# Patient Record
Sex: Female | Born: 1950
Health system: Southern US, Community
[De-identification: ages and names within clinical notes are randomized; demographics above are authoritative.]

## PROBLEM LIST (undated history)

## (undated) DIAGNOSIS — R630 Anorexia: Secondary | ICD-10-CM

## (undated) HISTORY — PX: COLECTOMY: SHX59

## (undated) HISTORY — DX: Anorexia: R63.0

---

## 2007-11-07 ENCOUNTER — Encounter: Payer: Self-pay | Admitting: Family Medicine

## 2007-11-08 ENCOUNTER — Encounter: Payer: Self-pay | Admitting: Family Medicine

## 2007-11-14 ENCOUNTER — Inpatient Hospital Stay (HOSPITAL_COMMUNITY): Admission: AD | Admit: 2007-11-14 | Discharge: 2007-11-22 | Payer: Self-pay | Admitting: Gastroenterology

## 2007-11-27 ENCOUNTER — Encounter: Payer: Self-pay | Admitting: Family Medicine

## 2007-12-14 ENCOUNTER — Encounter: Payer: Self-pay | Admitting: Family Medicine

## 2008-01-03 ENCOUNTER — Encounter: Payer: Self-pay | Admitting: Family Medicine

## 2008-01-09 ENCOUNTER — Encounter: Payer: Self-pay | Admitting: Family Medicine

## 2008-01-18 ENCOUNTER — Encounter: Payer: Self-pay | Admitting: Family Medicine

## 2008-03-06 ENCOUNTER — Ambulatory Visit: Payer: Self-pay | Admitting: Gastroenterology

## 2008-03-06 LAB — CONVERTED CEMR LAB
Basophils Absolute: 0 10*3/uL (ref 0.0–0.1)
Eosinophils Absolute: 0.3 10*3/uL (ref 0.0–0.7)
HCT: 32.5 % — ABNORMAL LOW (ref 36.0–46.0)
Hemoglobin: 10.8 g/dL — ABNORMAL LOW (ref 12.0–15.0)
MCHC: 33.2 g/dL (ref 30.0–36.0)
MCV: 85.4 fL (ref 78.0–100.0)
Monocytes Absolute: 1.1 10*3/uL — ABNORMAL HIGH (ref 0.1–1.0)
Monocytes Relative: 6.2 % (ref 3.0–12.0)
Neutro Abs: 14.9 10*3/uL — ABNORMAL HIGH (ref 1.4–7.7)
Platelets: 748 10*3/uL — ABNORMAL HIGH (ref 150–400)
RDW: 16 % — ABNORMAL HIGH (ref 11.5–14.6)

## 2008-03-13 ENCOUNTER — Ambulatory Visit: Payer: Self-pay | Admitting: Gastroenterology

## 2008-03-13 LAB — CONVERTED CEMR LAB
Basophils Relative: 0.1 % (ref 0.0–1.0)
Eosinophils Absolute: 0.4 10*3/uL (ref 0.0–0.7)
Eosinophils Relative: 3 % (ref 0.0–5.0)
Lymphocytes Relative: 18.7 % (ref 12.0–46.0)
MCV: 86.3 fL (ref 78.0–100.0)
Neutrophils Relative %: 69.6 % (ref 43.0–77.0)
Platelets: 861 10*3/uL — ABNORMAL HIGH (ref 150–400)
RBC: 3.24 M/uL — ABNORMAL LOW (ref 3.87–5.11)
WBC: 14.9 10*3/uL — ABNORMAL HIGH (ref 4.5–10.5)

## 2008-03-19 ENCOUNTER — Ambulatory Visit: Payer: Self-pay | Admitting: Gastroenterology

## 2008-03-21 ENCOUNTER — Inpatient Hospital Stay (HOSPITAL_COMMUNITY): Admission: AD | Admit: 2008-03-21 | Discharge: 2008-03-28 | Payer: Self-pay | Admitting: Gastroenterology

## 2008-03-27 ENCOUNTER — Ambulatory Visit: Payer: Self-pay | Admitting: Gastroenterology

## 2008-03-28 ENCOUNTER — Encounter: Payer: Self-pay | Admitting: Gastroenterology

## 2008-03-29 HISTORY — PX: ILEOSTOMY: SHX1783

## 2008-04-03 ENCOUNTER — Encounter: Payer: Self-pay | Admitting: Family Medicine

## 2008-04-05 ENCOUNTER — Ambulatory Visit: Payer: Self-pay | Admitting: Gastroenterology

## 2008-04-05 DIAGNOSIS — K219 Gastro-esophageal reflux disease without esophagitis: Secondary | ICD-10-CM | POA: Insufficient documentation

## 2008-04-05 DIAGNOSIS — K5100 Ulcerative (chronic) pancolitis without complications: Secondary | ICD-10-CM

## 2008-04-10 ENCOUNTER — Telehealth: Payer: Self-pay | Admitting: Gastroenterology

## 2008-04-12 ENCOUNTER — Ambulatory Visit: Payer: Self-pay | Admitting: Internal Medicine

## 2008-04-12 ENCOUNTER — Ambulatory Visit: Payer: Self-pay | Admitting: Cardiovascular Disease

## 2008-04-12 ENCOUNTER — Inpatient Hospital Stay (HOSPITAL_COMMUNITY): Admission: AD | Admit: 2008-04-12 | Discharge: 2008-05-02 | Payer: Self-pay | Admitting: Gastroenterology

## 2008-04-12 ENCOUNTER — Encounter (INDEPENDENT_AMBULATORY_CARE_PROVIDER_SITE_OTHER): Payer: Self-pay | Admitting: General Surgery

## 2008-04-17 ENCOUNTER — Encounter (INDEPENDENT_AMBULATORY_CARE_PROVIDER_SITE_OTHER): Payer: Self-pay | Admitting: Internal Medicine

## 2008-04-25 ENCOUNTER — Ambulatory Visit: Payer: Self-pay | Admitting: Physical Medicine & Rehabilitation

## 2008-05-15 ENCOUNTER — Encounter: Payer: Self-pay | Admitting: Gastroenterology

## 2008-05-16 ENCOUNTER — Telehealth: Payer: Self-pay | Admitting: Gastroenterology

## 2008-05-17 ENCOUNTER — Telehealth: Payer: Self-pay | Admitting: Gastroenterology

## 2008-05-22 ENCOUNTER — Ambulatory Visit: Payer: Self-pay | Admitting: Gastroenterology

## 2008-05-24 ENCOUNTER — Telehealth (INDEPENDENT_AMBULATORY_CARE_PROVIDER_SITE_OTHER): Payer: Self-pay | Admitting: *Deleted

## 2008-06-19 ENCOUNTER — Telehealth: Payer: Self-pay | Admitting: Gastroenterology

## 2008-06-24 ENCOUNTER — Telehealth: Payer: Self-pay | Admitting: Gastroenterology

## 2008-06-27 ENCOUNTER — Encounter: Payer: Self-pay | Admitting: Gastroenterology

## 2008-07-05 ENCOUNTER — Telehealth: Payer: Self-pay | Admitting: Gastroenterology

## 2008-07-09 ENCOUNTER — Telehealth: Payer: Self-pay | Admitting: Gastroenterology

## 2008-07-11 ENCOUNTER — Inpatient Hospital Stay (HOSPITAL_COMMUNITY): Admission: EM | Admit: 2008-07-11 | Discharge: 2008-08-02 | Payer: Self-pay | Admitting: Emergency Medicine

## 2008-07-18 ENCOUNTER — Ambulatory Visit: Payer: Self-pay | Admitting: Infectious Diseases

## 2008-07-18 ENCOUNTER — Ambulatory Visit: Payer: Self-pay | Admitting: Internal Medicine

## 2008-07-22 ENCOUNTER — Encounter (INDEPENDENT_AMBULATORY_CARE_PROVIDER_SITE_OTHER): Payer: Self-pay | Admitting: Diagnostic Radiology

## 2008-07-24 ENCOUNTER — Encounter: Payer: Self-pay | Admitting: Gastroenterology

## 2008-07-29 ENCOUNTER — Encounter: Payer: Self-pay | Admitting: Gastroenterology

## 2008-08-08 ENCOUNTER — Telehealth: Payer: Self-pay | Admitting: Gastroenterology

## 2008-08-08 ENCOUNTER — Ambulatory Visit: Payer: Self-pay | Admitting: Cardiology

## 2008-08-08 LAB — CONVERTED CEMR LAB
Basophils Relative: 5.4 % — ABNORMAL HIGH (ref 0.0–3.0)
Eosinophils Relative: 4.3 % (ref 0.0–5.0)
HCT: 32.7 % — ABNORMAL LOW (ref 36.0–46.0)
INR: 6.3 (ref 0.8–1.0)
Monocytes Relative: 8.5 % (ref 3.0–12.0)
Neutrophils Relative %: 58.5 % (ref 43.0–77.0)
Platelets: 571 10*3/uL — ABNORMAL HIGH (ref 150–400)
RBC: 3.41 M/uL — ABNORMAL LOW (ref 3.87–5.11)
WBC: 9.1 10*3/uL (ref 4.5–10.5)

## 2008-08-13 ENCOUNTER — Ambulatory Visit: Payer: Self-pay | Admitting: Gastroenterology

## 2008-08-13 DIAGNOSIS — B372 Candidiasis of skin and nail: Secondary | ICD-10-CM | POA: Insufficient documentation

## 2008-08-15 ENCOUNTER — Encounter: Payer: Self-pay | Admitting: Gastroenterology

## 2008-08-15 ENCOUNTER — Ambulatory Visit: Payer: Self-pay | Admitting: Cardiology

## 2008-08-16 ENCOUNTER — Telehealth: Payer: Self-pay | Admitting: Gastroenterology

## 2008-08-19 ENCOUNTER — Ambulatory Visit (HOSPITAL_COMMUNITY): Admission: RE | Admit: 2008-08-19 | Discharge: 2008-08-19 | Payer: Self-pay | Admitting: Gastroenterology

## 2008-08-21 ENCOUNTER — Ambulatory Visit: Payer: Self-pay | Admitting: Gastroenterology

## 2008-08-21 DIAGNOSIS — R197 Diarrhea, unspecified: Secondary | ICD-10-CM | POA: Insufficient documentation

## 2008-08-21 LAB — CONVERTED CEMR LAB
ALT: 26 units/L (ref 0–35)
AST: 26 units/L (ref 0–37)
Alkaline Phosphatase: 80 units/L (ref 39–117)
Basophils Absolute: 0 10*3/uL (ref 0.0–0.1)
Basophils Relative: 0.2 % (ref 0.0–3.0)
Creatinine, Ser: 0.6 mg/dL (ref 0.4–1.2)
Eosinophils Absolute: 0.2 10*3/uL (ref 0.0–0.7)
GFR calc Af Amer: 133 mL/min
Hemoglobin: 12.9 g/dL (ref 12.0–15.0)
MCHC: 34.6 g/dL (ref 30.0–36.0)
MCV: 96.8 fL (ref 78.0–100.0)
Monocytes Absolute: 0.6 10*3/uL (ref 0.1–1.0)
Neutro Abs: 9.5 10*3/uL — ABNORMAL HIGH (ref 1.4–7.7)
RBC: 3.87 M/uL (ref 3.87–5.11)
RDW: 16.2 % — ABNORMAL HIGH (ref 11.5–14.6)
Total Bilirubin: 0.6 mg/dL (ref 0.3–1.2)

## 2008-08-22 ENCOUNTER — Encounter: Payer: Self-pay | Admitting: Gastroenterology

## 2008-08-23 ENCOUNTER — Telehealth: Payer: Self-pay | Admitting: Gastroenterology

## 2008-09-09 ENCOUNTER — Ambulatory Visit: Payer: Self-pay | Admitting: Family Medicine

## 2008-09-09 DIAGNOSIS — L259 Unspecified contact dermatitis, unspecified cause: Secondary | ICD-10-CM | POA: Insufficient documentation

## 2008-09-09 DIAGNOSIS — Z8619 Personal history of other infectious and parasitic diseases: Secondary | ICD-10-CM | POA: Insufficient documentation

## 2008-09-17 ENCOUNTER — Ambulatory Visit: Payer: Self-pay | Admitting: Internal Medicine

## 2008-09-19 ENCOUNTER — Telehealth (INDEPENDENT_AMBULATORY_CARE_PROVIDER_SITE_OTHER): Payer: Self-pay | Admitting: *Deleted

## 2008-09-20 ENCOUNTER — Ambulatory Visit: Payer: Self-pay | Admitting: Gastroenterology

## 2008-09-20 LAB — CONVERTED CEMR LAB
Albumin: 3.7 g/dL (ref 3.5–5.2)
CO2: 26 meq/L (ref 19–32)
Eosinophils Relative: 1.8 % (ref 0.0–5.0)
GFR calc Af Amer: 133 mL/min
GFR calc non Af Amer: 110 mL/min
Glucose, Bld: 96 mg/dL (ref 70–99)
Hemoglobin: 12.7 g/dL (ref 12.0–15.0)
INR: 1 (ref 0.8–1.0)
Lymphocytes Relative: 26.8 % (ref 12.0–46.0)
Monocytes Relative: 6.4 % (ref 3.0–12.0)
Neutro Abs: 6.2 10*3/uL (ref 1.4–7.7)
Prothrombin Time: 11.9 s (ref 10.9–13.3)
RBC: 3.73 M/uL — ABNORMAL LOW (ref 3.87–5.11)
RDW: 14 % (ref 11.5–14.6)
Sodium: 141 meq/L (ref 135–145)
Total Bilirubin: 0.6 mg/dL (ref 0.3–1.2)
Total Protein: 7.4 g/dL (ref 6.0–8.3)
WBC: 9.6 10*3/uL (ref 4.5–10.5)

## 2008-09-23 ENCOUNTER — Ambulatory Visit (HOSPITAL_COMMUNITY): Admission: RE | Admit: 2008-09-23 | Discharge: 2008-09-23 | Payer: Self-pay | Admitting: Gastroenterology

## 2008-09-24 ENCOUNTER — Encounter: Payer: Self-pay | Admitting: Gastroenterology

## 2008-10-09 ENCOUNTER — Ambulatory Visit: Payer: Self-pay | Admitting: Gastroenterology

## 2008-10-09 DIAGNOSIS — L659 Nonscarring hair loss, unspecified: Secondary | ICD-10-CM | POA: Insufficient documentation

## 2008-10-10 ENCOUNTER — Encounter: Payer: Self-pay | Admitting: Family Medicine

## 2008-10-10 ENCOUNTER — Other Ambulatory Visit: Admission: RE | Admit: 2008-10-10 | Discharge: 2008-10-10 | Payer: Self-pay | Admitting: Family Medicine

## 2008-10-10 ENCOUNTER — Ambulatory Visit: Payer: Self-pay | Admitting: Family Medicine

## 2008-10-10 ENCOUNTER — Telehealth: Payer: Self-pay | Admitting: Gastroenterology

## 2008-10-11 ENCOUNTER — Encounter: Payer: Self-pay | Admitting: Gastroenterology

## 2008-10-14 ENCOUNTER — Telehealth: Payer: Self-pay | Admitting: Gastroenterology

## 2008-10-15 ENCOUNTER — Encounter (INDEPENDENT_AMBULATORY_CARE_PROVIDER_SITE_OTHER): Payer: Self-pay | Admitting: *Deleted

## 2008-10-15 ENCOUNTER — Ambulatory Visit: Payer: Self-pay | Admitting: Family Medicine

## 2008-10-16 ENCOUNTER — Encounter: Payer: Self-pay | Admitting: Family Medicine

## 2008-10-20 LAB — CONVERTED CEMR LAB
AST: 23 units/L (ref 0–37)
Albumin: 3.7 g/dL (ref 3.5–5.2)
Alkaline Phosphatase: 84 units/L (ref 39–117)
BUN: 15 mg/dL (ref 6–23)
Chloride: 106 meq/L (ref 96–112)
Direct LDL: 112.8 mg/dL
Eosinophils Relative: 2.9 % (ref 0.0–5.0)
Free T4: 0.6 ng/dL (ref 0.6–1.6)
GFR calc non Af Amer: 110 mL/min
Glucose, Bld: 101 mg/dL — ABNORMAL HIGH (ref 70–99)
HDL: 81.6 mg/dL (ref >39.0)
Monocytes Relative: 6.8 % (ref 3.0–12.0)
Neutrophils Relative %: 66.3 % (ref 43.0–77.0)
Platelets: 481 10*3/uL — ABNORMAL HIGH (ref 150–400)
Potassium: 4.4 meq/L (ref 3.5–5.1)
T3, Free: 2.5 pg/mL (ref 2.3–4.2)
TSH: 0.55 microintl units/mL (ref 0.35–5.50)
Total CHOL/HDL Ratio: 2.6
Total Protein: 7.3 g/dL (ref 6.0–8.3)
Triglycerides: 91 mg/dL (ref 0–149)
WBC: 9.3 10*3/uL (ref 4.5–10.5)

## 2008-10-21 ENCOUNTER — Encounter (INDEPENDENT_AMBULATORY_CARE_PROVIDER_SITE_OTHER): Payer: Self-pay | Admitting: *Deleted

## 2008-10-21 ENCOUNTER — Telehealth (INDEPENDENT_AMBULATORY_CARE_PROVIDER_SITE_OTHER): Payer: Self-pay | Admitting: *Deleted

## 2008-10-25 ENCOUNTER — Encounter (INDEPENDENT_AMBULATORY_CARE_PROVIDER_SITE_OTHER): Payer: Self-pay | Admitting: *Deleted

## 2008-11-07 ENCOUNTER — Ambulatory Visit: Payer: Self-pay | Admitting: Family Medicine

## 2008-11-25 ENCOUNTER — Ambulatory Visit: Payer: Self-pay | Admitting: Family Medicine

## 2008-11-25 DIAGNOSIS — H1030 Unspecified acute conjunctivitis, unspecified eye: Secondary | ICD-10-CM | POA: Insufficient documentation

## 2008-11-25 DIAGNOSIS — R059 Cough, unspecified: Secondary | ICD-10-CM | POA: Insufficient documentation

## 2008-11-25 DIAGNOSIS — R05 Cough: Secondary | ICD-10-CM

## 2008-12-05 ENCOUNTER — Ambulatory Visit: Payer: Self-pay | Admitting: Gastroenterology

## 2008-12-06 ENCOUNTER — Ambulatory Visit (HOSPITAL_COMMUNITY): Admission: RE | Admit: 2008-12-06 | Discharge: 2008-12-06 | Payer: Self-pay | Admitting: Gastroenterology

## 2008-12-06 ENCOUNTER — Ambulatory Visit: Payer: Self-pay | Admitting: Gastroenterology

## 2008-12-11 ENCOUNTER — Encounter: Payer: Self-pay | Admitting: Gastroenterology

## 2008-12-16 ENCOUNTER — Ambulatory Visit: Payer: Self-pay | Admitting: Gastroenterology

## 2009-03-19 ENCOUNTER — Ambulatory Visit: Payer: Self-pay | Admitting: Family Medicine

## 2009-03-19 DIAGNOSIS — R5383 Other fatigue: Secondary | ICD-10-CM

## 2009-03-21 LAB — CONVERTED CEMR LAB
BUN: 16 mg/dL (ref 6–23)
Basophils Absolute: 0 10*3/uL (ref 0.0–0.1)
CO2: 28 meq/L (ref 19–32)
Eosinophils Absolute: 0.2 10*3/uL (ref 0.0–0.7)
GFR calc non Af Amer: 91.46 mL/min (ref >60)
Glucose, Bld: 88 mg/dL (ref 70–99)
HCT: 40.1 % (ref 36.0–46.0)
Iron: 78 ug/dL (ref 42–145)
Lymphs Abs: 2.5 10*3/uL (ref 0.7–4.0)
MCHC: 33.9 g/dL (ref 30.0–36.0)
MCV: 95.2 fL (ref 78.0–100.0)
Monocytes Absolute: 0.6 10*3/uL (ref 0.1–1.0)
Monocytes Relative: 6.2 % (ref 3.0–12.0)
Neutro Abs: 5.9 10*3/uL (ref 1.4–7.7)
Platelets: 542 10*3/uL — ABNORMAL HIGH (ref 150.0–400.0)
Potassium: 3.9 meq/L (ref 3.5–5.1)
RDW: 12.5 % (ref 11.5–14.6)
TSH: 0.45 microintl units/mL (ref 0.35–5.50)

## 2009-03-24 ENCOUNTER — Encounter (INDEPENDENT_AMBULATORY_CARE_PROVIDER_SITE_OTHER): Payer: Self-pay | Admitting: *Deleted

## 2009-04-24 ENCOUNTER — Encounter (INDEPENDENT_AMBULATORY_CARE_PROVIDER_SITE_OTHER): Payer: Self-pay | Admitting: *Deleted

## 2009-04-24 ENCOUNTER — Ambulatory Visit: Payer: Self-pay | Admitting: Family Medicine

## 2009-04-24 DIAGNOSIS — B789 Strongyloidiasis, unspecified: Secondary | ICD-10-CM | POA: Insufficient documentation

## 2009-04-24 DIAGNOSIS — R7989 Other specified abnormal findings of blood chemistry: Secondary | ICD-10-CM | POA: Insufficient documentation

## 2009-04-24 HISTORY — DX: Strongyloidiasis, unspecified: B78.9

## 2009-04-24 LAB — CONVERTED CEMR LAB
Basophils Absolute: 0 10*3/uL (ref 0.0–0.1)
Eosinophils Absolute: 0.4 10*3/uL (ref 0.0–0.7)
Lymphocytes Relative: 24.7 % (ref 12.0–46.0)
MCHC: 35.3 g/dL (ref 30.0–36.0)
Monocytes Relative: 7 % (ref 3.0–12.0)
Platelets: 398 10*3/uL (ref 150.0–400.0)
RDW: 13.1 % (ref 11.5–14.6)

## 2009-05-02 ENCOUNTER — Ambulatory Visit: Payer: Self-pay | Admitting: Family Medicine

## 2009-05-02 ENCOUNTER — Encounter (INDEPENDENT_AMBULATORY_CARE_PROVIDER_SITE_OTHER): Payer: Self-pay | Admitting: *Deleted

## 2009-05-02 DIAGNOSIS — B029 Zoster without complications: Secondary | ICD-10-CM | POA: Insufficient documentation

## 2009-05-05 ENCOUNTER — Telehealth (INDEPENDENT_AMBULATORY_CARE_PROVIDER_SITE_OTHER): Payer: Self-pay | Admitting: *Deleted

## 2009-05-14 ENCOUNTER — Telehealth (INDEPENDENT_AMBULATORY_CARE_PROVIDER_SITE_OTHER): Payer: Self-pay | Admitting: *Deleted

## 2009-05-15 ENCOUNTER — Encounter: Payer: Self-pay | Admitting: Family Medicine

## 2009-05-20 ENCOUNTER — Telehealth: Payer: Self-pay | Admitting: Family Medicine

## 2009-07-29 ENCOUNTER — Telehealth: Payer: Self-pay | Admitting: Family Medicine

## 2009-08-01 ENCOUNTER — Ambulatory Visit: Payer: Self-pay | Admitting: Family Medicine

## 2009-08-05 ENCOUNTER — Encounter (INDEPENDENT_AMBULATORY_CARE_PROVIDER_SITE_OTHER): Payer: Self-pay | Admitting: *Deleted

## 2009-09-05 ENCOUNTER — Telehealth (INDEPENDENT_AMBULATORY_CARE_PROVIDER_SITE_OTHER): Payer: Self-pay | Admitting: *Deleted

## 2009-09-16 ENCOUNTER — Ambulatory Visit: Payer: Self-pay | Admitting: Family Medicine

## 2009-09-16 DIAGNOSIS — N390 Urinary tract infection, site not specified: Secondary | ICD-10-CM | POA: Insufficient documentation

## 2009-09-16 LAB — CONVERTED CEMR LAB
Glucose, Urine, Semiquant: NEGATIVE
Ketones, urine, test strip: NEGATIVE
Nitrite: NEGATIVE
Specific Gravity, Urine: <1.005
WBC Urine, dipstick: NEGATIVE
pH: 5

## 2009-09-17 ENCOUNTER — Encounter: Payer: Self-pay | Admitting: Family Medicine

## 2009-09-22 ENCOUNTER — Encounter: Payer: Self-pay | Admitting: Family Medicine

## 2009-09-23 LAB — CONVERTED CEMR LAB
Basophils Relative: 1 % (ref 0.0–3.0)
CO2: 28 meq/L (ref 19–32)
Calcium: 9.8 mg/dL (ref 8.4–10.5)
Creatinine, Ser: 0.6 mg/dL (ref 0.4–1.2)
Eosinophils Relative: 9 % — ABNORMAL HIGH (ref 0.0–5.0)
Folate: 11 ng/mL
GFR calc non Af Amer: 109.07 mL/min (ref >60)
Glucose, Bld: 85 mg/dL (ref 70–99)
Hemoglobin: 12.8 g/dL (ref 12.0–15.0)
Lymphocytes Relative: 53 % — ABNORMAL HIGH (ref 12.0–46.0)
Lymphs Abs: 3.4 10*3/uL (ref 0.7–4.0)
MCHC: 34.2 g/dL (ref 30.0–36.0)
Monocytes Absolute: 5 10*3/uL — ABNORMAL HIGH (ref 0.1–1.0)
Neutrophils Relative %: 24 % — ABNORMAL LOW (ref 43.0–77.0)
RDW: 13.8 % (ref 11.5–14.6)
Sodium: 140 meq/L (ref 135–145)

## 2009-09-24 ENCOUNTER — Ambulatory Visit: Payer: Self-pay | Admitting: Family Medicine

## 2009-10-06 ENCOUNTER — Encounter (INDEPENDENT_AMBULATORY_CARE_PROVIDER_SITE_OTHER): Payer: Self-pay | Admitting: *Deleted

## 2009-10-06 LAB — CONVERTED CEMR LAB
Basophils Absolute: 0 10*3/uL (ref 0.0–0.1)
HCT: 38.6 % (ref 36.0–46.0)
Lymphs Abs: 2.6 10*3/uL (ref 0.7–4.0)
MCHC: 33.5 g/dL (ref 30.0–36.0)
MCV: 96.9 fL (ref 78.0–100.0)
Monocytes Absolute: 0.9 10*3/uL (ref 0.1–1.0)
Platelets: 505 10*3/uL — ABNORMAL HIGH (ref 150.0–400.0)
RDW: 13.3 % (ref 11.5–14.6)

## 2009-10-07 ENCOUNTER — Encounter: Payer: Self-pay | Admitting: Family Medicine

## 2009-10-07 ENCOUNTER — Ambulatory Visit: Payer: Self-pay | Admitting: Cardiology

## 2009-10-07 ENCOUNTER — Telehealth (INDEPENDENT_AMBULATORY_CARE_PROVIDER_SITE_OTHER): Payer: Self-pay | Admitting: *Deleted

## 2009-10-07 ENCOUNTER — Ambulatory Visit: Payer: Self-pay | Admitting: Family

## 2009-10-07 DIAGNOSIS — M545 Low back pain, unspecified: Secondary | ICD-10-CM | POA: Insufficient documentation

## 2009-10-07 DIAGNOSIS — J9819 Other pulmonary collapse: Secondary | ICD-10-CM

## 2009-10-07 LAB — CONVERTED CEMR LAB
Basophils Absolute: 0 10*3/uL (ref 0.0–0.1)
CO2: 26 meq/L (ref 19–32)
Calcium: 9.2 mg/dL (ref 8.4–10.5)
Eosinophils Absolute: 0.3 10*3/uL (ref 0.0–0.7)
GFR calc non Af Amer: 91.28 mL/min (ref >60)
HCT: 37.2 % (ref 36.0–46.0)
Lymphs Abs: 2.8 10*3/uL (ref 0.7–4.0)
MCHC: 34.2 g/dL (ref 30.0–36.0)
Monocytes Relative: 7.7 % (ref 3.0–12.0)
Nitrite: NEGATIVE
Platelets: 447 10*3/uL — ABNORMAL HIGH (ref 150.0–400.0)
RDW: 13.3 % (ref 11.5–14.6)
Sodium: 142 meq/L (ref 135–145)
Urobilinogen, UA: 0.2

## 2009-10-08 ENCOUNTER — Encounter: Payer: Self-pay | Admitting: Family

## 2009-10-09 ENCOUNTER — Ambulatory Visit: Payer: Self-pay | Admitting: Family Medicine

## 2009-12-10 IMAGING — CR DG CHEST 1V PORT
1 series · 1 of 1 positions shown · non-contrast
Comparison: 04/29/2008

CLINICAL DATA: Rectal bleeding, dehydration.  PICC line placement.

PORTABLE CHEST - 1 VIEW

[view not recorded]
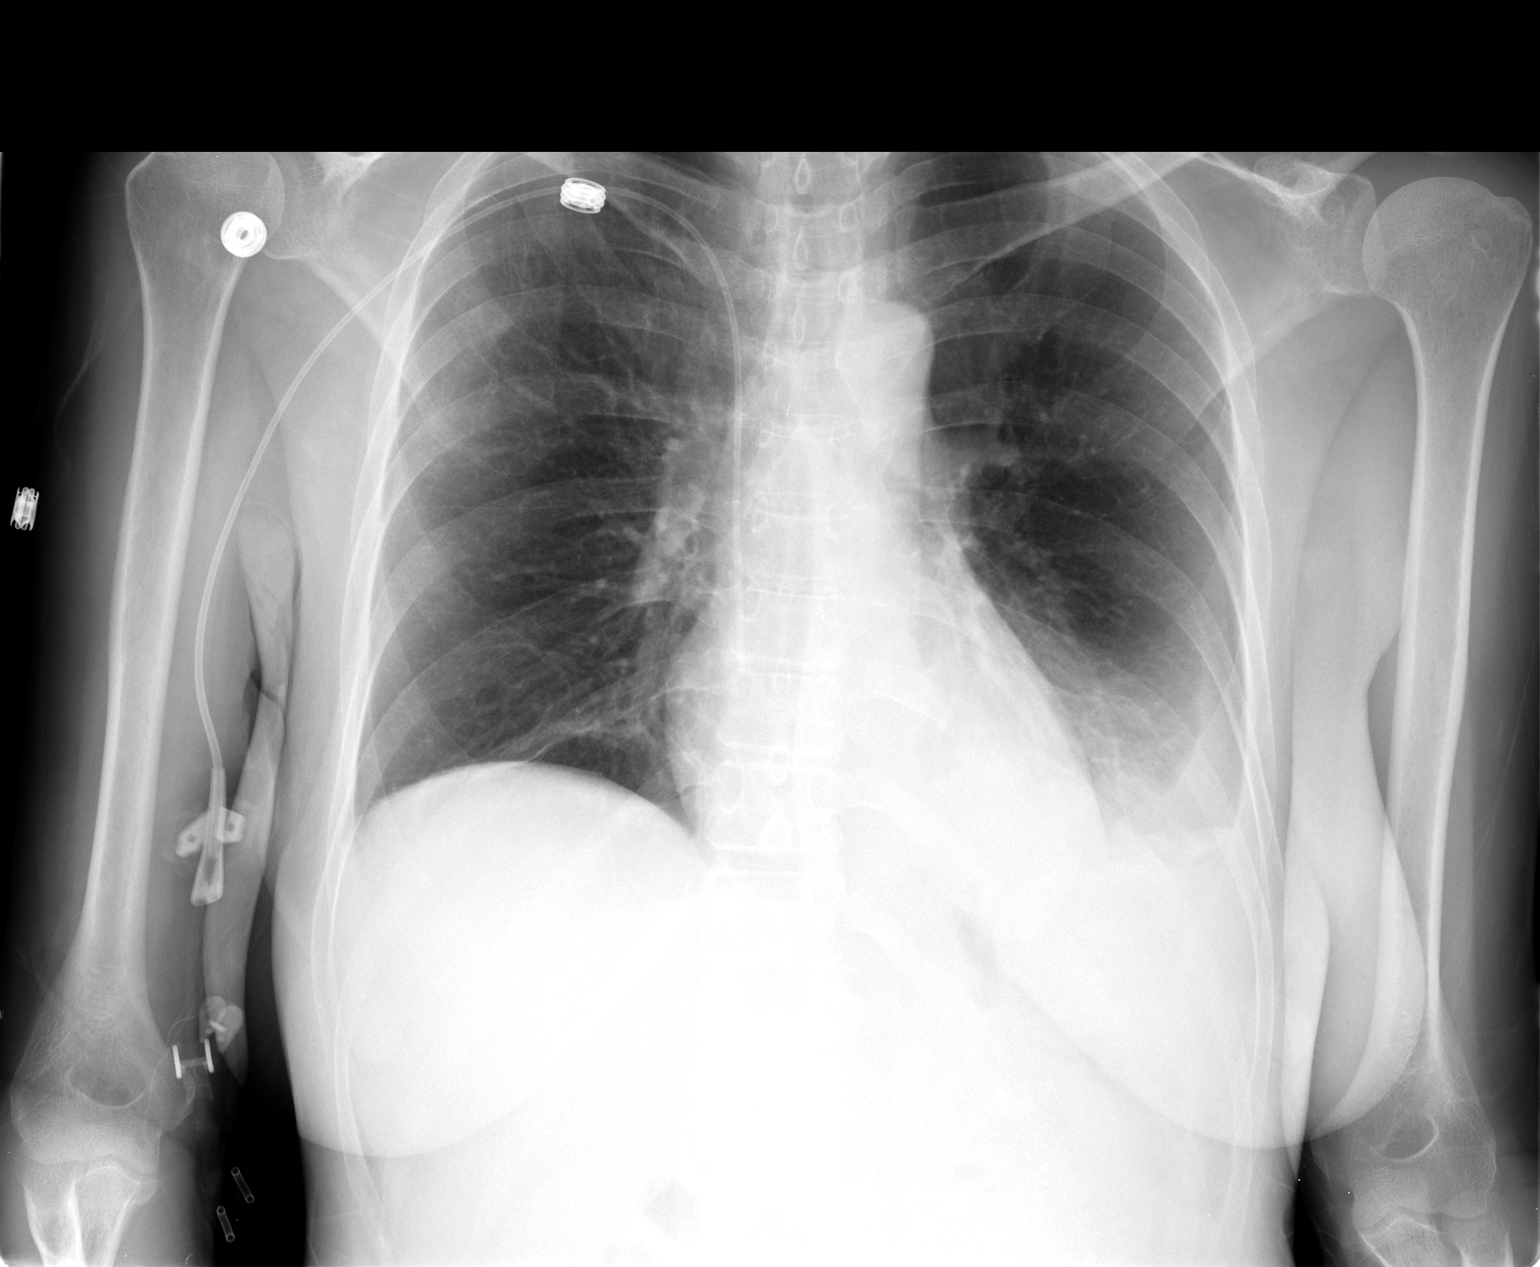

[1 of 1 positions shown; findings below may reference images not displayed]

FINDINGS: A right-sided PICC line is noted with tip overlying the
upper right atrium - consider retracting 2 cm.
Left lower lung atelectasis/airspace disease and small left pleural
effusion are again noted.
The right lung is clear.
No evidence of pneumothorax noted.
No acute bony abnormalities are present.
IMPRESSION: Right-sided PICC line with tip overlying the upper right atrium -
recommend 2 cm retraction.

Continued left lower lung atelectasis/airspace disease and small
left pleural effusion.

## 2009-12-14 IMAGING — CT CT ABDOMEN W/O CM
2 of 4 series · 16 of 46 positions shown, 18 images · non-contrast
Comparison: 07/17/2008

CT ABDOMEN

CLINICAL DATA: Worsening abdominal pain

CT ABDOMEN AND PELVIS WITHOUT CONTRAST
TECHNIQUE: Multidetector CT imaging of the abdomen and pelvis was
performed following the standard
protocol without intravenous contrast.

[Series 2: abd_pel 5.0 b40f st · axial · 0.65mm/px · z∈[-423,-28]mm · 13 of 87 slices shown, 15 images]
[im 4/87  soft-tissue]
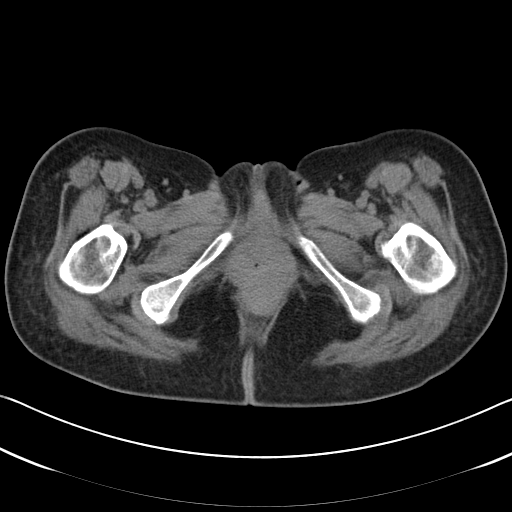
[im 4/87  bone]
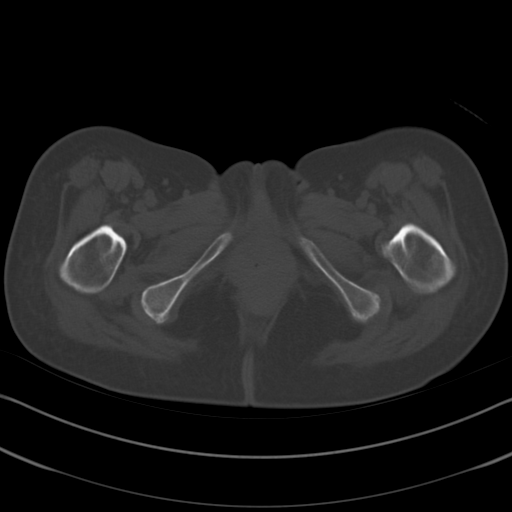
[im 11/87  soft-tissue]
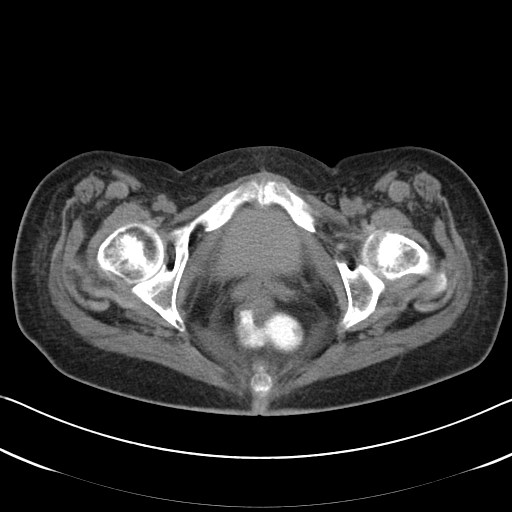
[im 18/87  soft-tissue]
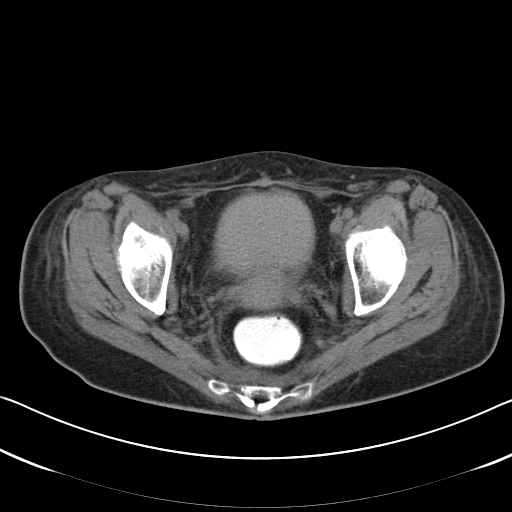
[im 25/87  soft-tissue]
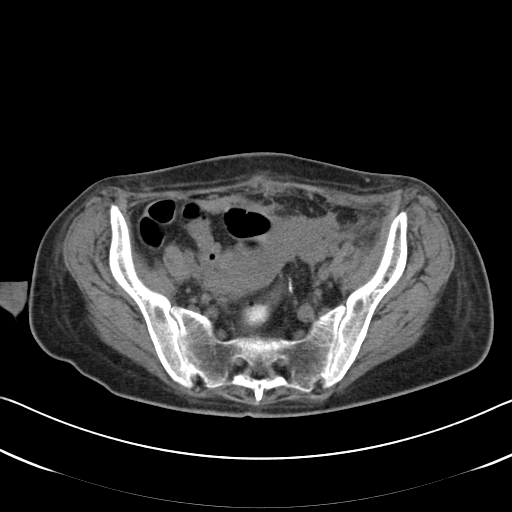
[im 31/87  soft-tissue]
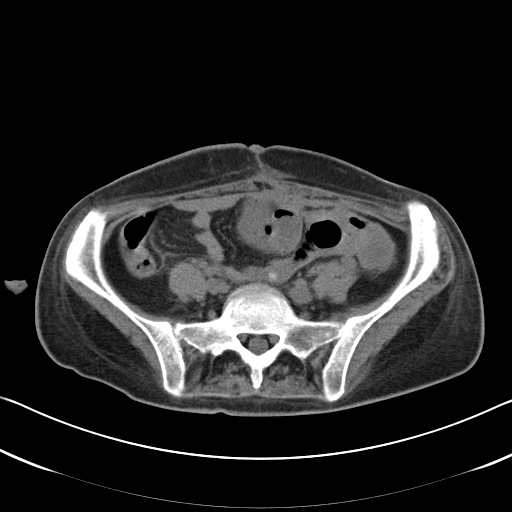
[im 38/87  soft-tissue]
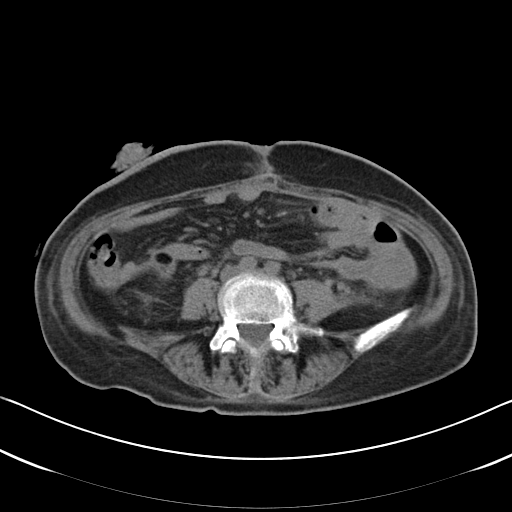
[im 45/87  soft-tissue]
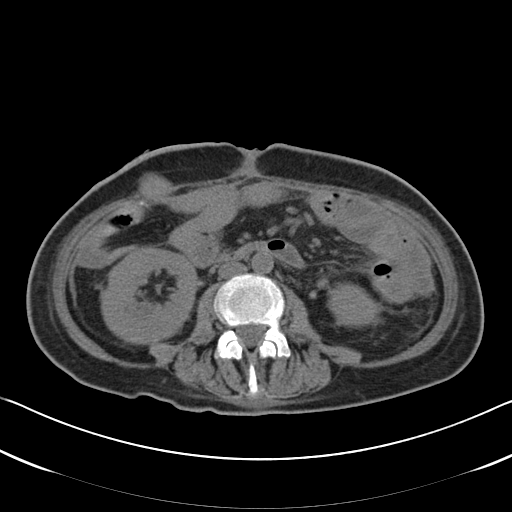
[im 49/87  soft-tissue]
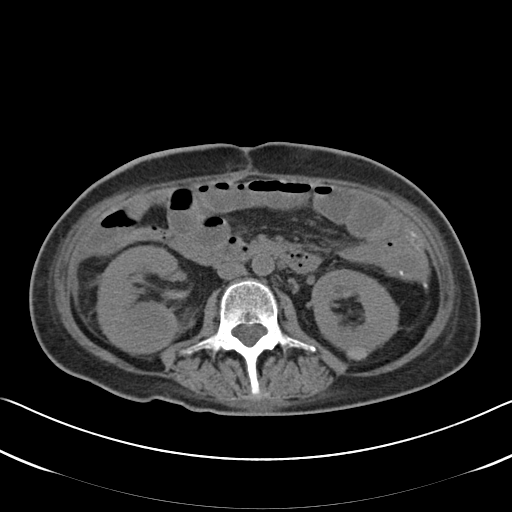
[im 56/87  soft-tissue]
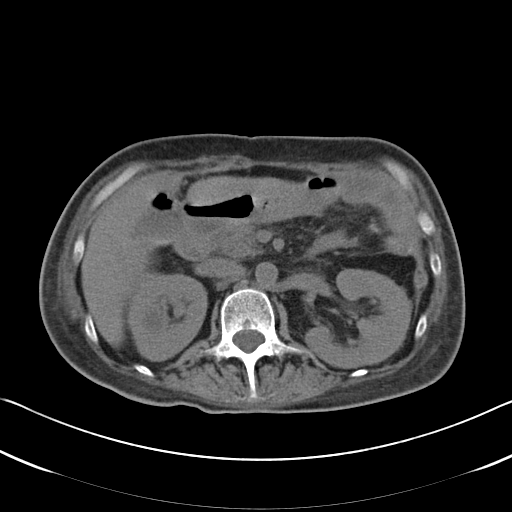
[im 56/87  bone]
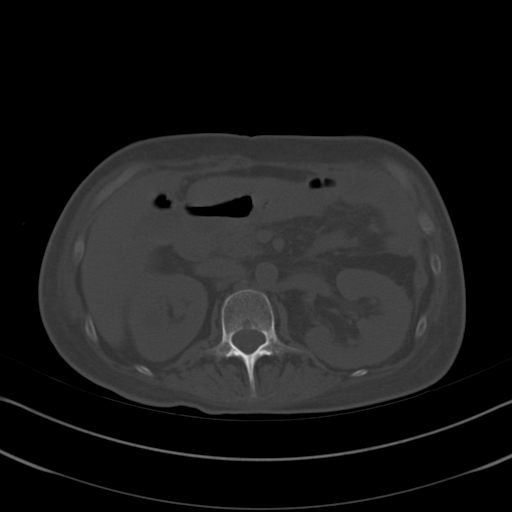
[im 62/87  soft-tissue]
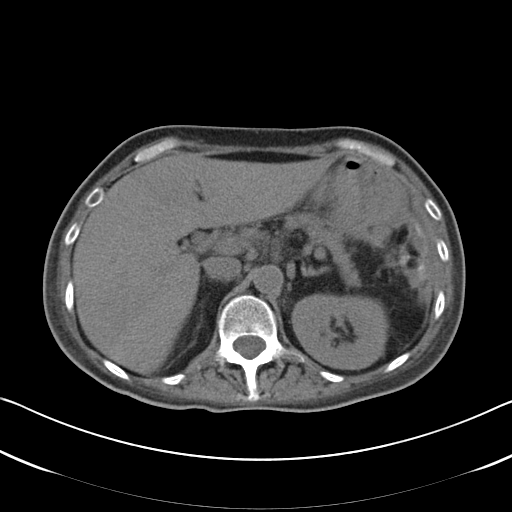
[im 69/87  soft-tissue]
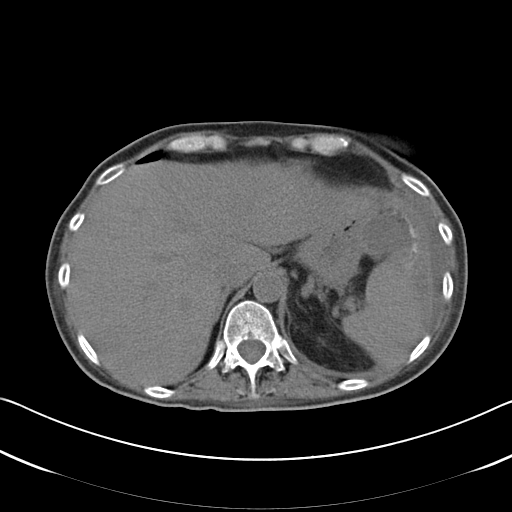
[im 76/87  soft-tissue]
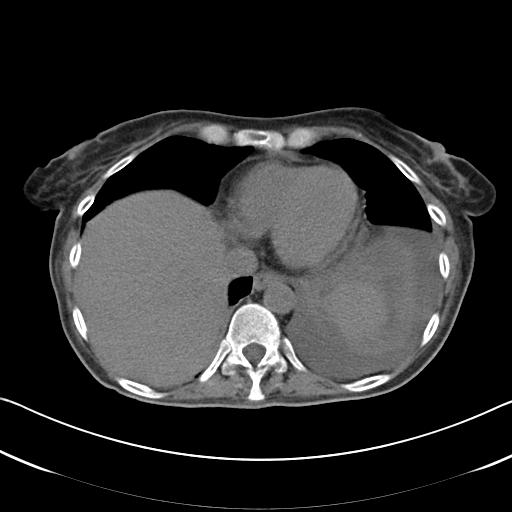
[im 83/87  soft-tissue]
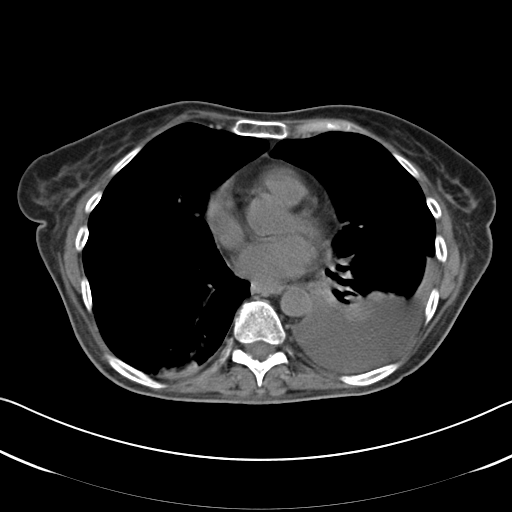

[Series 602: coronal · coronal · 0.88mm/px · 3 of 61 slices shown]
[im 21/61  soft-tissue]
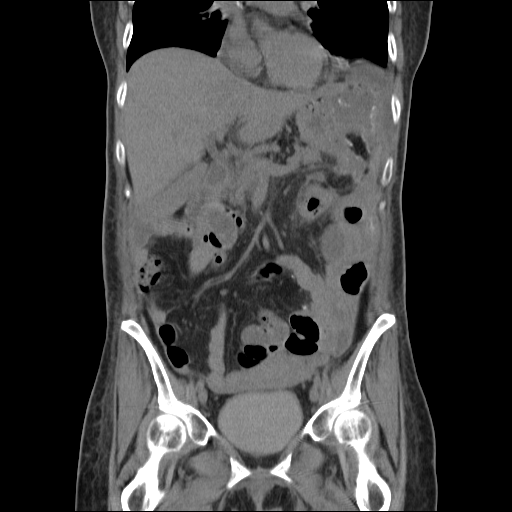
[im 27/61  soft-tissue]
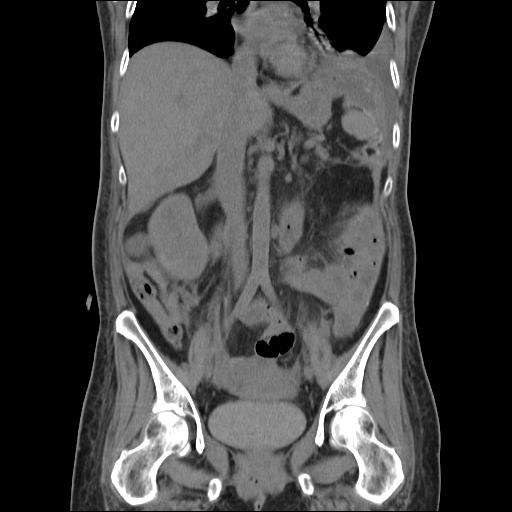
[im 34/61  soft-tissue]
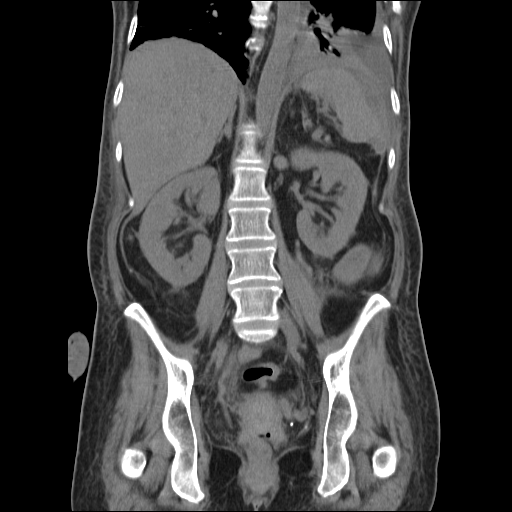

[16 of 46 positions shown; findings below may reference images not displayed]

FINDINGS: There is a moderate left effusion with overlying
atelectasis/consolidation.  This is unchanged from prior exam.

Atelectasis at the right base is noted.  This is new from the pre
previous exam

Left upper quadrant perisplenic fluid collection is again noted.
This measures 3.5 by 7.3 cm, image 18.  This is unchanged from
previous study

The splenic parenchyma appears normal

The adrenal glands appear normal

The liver is negative

Gallbladder negative.

Pancreas negative.

There are bilateral exophytic structures arising from both kidneys
unchanged from previous exam these are incompletely characterized
without intravenous contrast material

There is no evidence for obstructive uropathy

No enlarged retroperitoneal or small bowel mesenteric lymph nodes.

The patient has a right lower quadrant ileostomy

There is no evidence for rupture of patient's abscess or
intraperitoneal hemorrhage
IMPRESSION: 1.  Stable appearance of the left subphrenic abscess.

2.  No change in moderate left effusion with overlying
atelectasis/consolidation

CT PELVIS
FINDINGS: Without intravenous contrast material, the complex
fluid collection posterior to the uterine fundus is difficult to
characterize and distinguish from adjacent loops of bowel and
uterus.

Dilated and thrombosed left ovarian vein is again noted.

There is contrast material within the rectum.

No enlarged pelvic or inguinal lymph nodes.

Contrast material from recent CT is identified within the urinary
bladder.
IMPRESSION: 1.  Complex fluid collection adjacent to the uterine fundus is
difficult to characterize without intravenous contrast material

2.  No evidence for pelvic hemorrhage.  No significant free fluid
within the pelvis.

## 2010-07-03 ENCOUNTER — Ambulatory Visit: Payer: Self-pay | Admitting: Family Medicine

## 2010-07-03 ENCOUNTER — Telehealth: Payer: Self-pay | Admitting: Family Medicine

## 2010-07-03 DIAGNOSIS — H6690 Otitis media, unspecified, unspecified ear: Secondary | ICD-10-CM | POA: Insufficient documentation

## 2010-07-03 DIAGNOSIS — G4733 Obstructive sleep apnea (adult) (pediatric): Secondary | ICD-10-CM | POA: Insufficient documentation

## 2010-07-06 ENCOUNTER — Ambulatory Visit: Payer: Self-pay | Admitting: Family Medicine

## 2010-07-06 DIAGNOSIS — H60339 Swimmer's ear, unspecified ear: Secondary | ICD-10-CM | POA: Insufficient documentation

## 2010-10-13 ENCOUNTER — Ambulatory Visit: Payer: Self-pay | Admitting: Family Medicine

## 2010-10-19 LAB — CONVERTED CEMR LAB
AST: 25 units/L (ref 0–37)
Albumin: 4 g/dL (ref 3.5–5.2)
Alkaline Phosphatase: 89 units/L (ref 39–117)
Basophils Relative: 0.4 % (ref 0.0–3.0)
Bilirubin, Direct: 0.1 mg/dL (ref 0.0–0.3)
CO2: 24 meq/L (ref 19–32)
Calcium: 9 mg/dL (ref 8.4–10.5)
Creatinine, Ser: 0.6 mg/dL (ref 0.4–1.2)
Eosinophils Relative: 2.6 % (ref 0.0–5.0)
Folate: 10.2 ng/mL
GFR calc non Af Amer: 106.62 mL/min (ref >60)
Hemoglobin: 12.4 g/dL (ref 12.0–15.0)
Lymphocytes Relative: 26 % (ref 12.0–46.0)
Monocytes Relative: 7.2 % (ref 3.0–12.0)
Neutro Abs: 6.5 10*3/uL (ref 1.4–7.7)
Neutrophils Relative %: 63.8 % (ref 43.0–77.0)
RBC: 3.88 M/uL (ref 3.87–5.11)
Sodium: 136 meq/L (ref 135–145)
Total Protein: 7.3 g/dL (ref 6.0–8.3)
Vitamin B-12: 616 pg/mL (ref 211–911)
WBC: 10.1 10*3/uL (ref 4.5–10.5)

## 2010-10-26 ENCOUNTER — Telehealth: Payer: Self-pay | Admitting: Family Medicine

## 2010-11-03 ENCOUNTER — Encounter: Payer: Self-pay | Admitting: Family Medicine

## 2010-11-09 ENCOUNTER — Telehealth: Payer: Self-pay | Admitting: Family Medicine

## 2010-11-09 ENCOUNTER — Inpatient Hospital Stay (HOSPITAL_COMMUNITY): Admission: EM | Admit: 2010-11-09 | Discharge: 2010-11-16 | Payer: Self-pay | Source: Home / Self Care

## 2010-12-31 NOTE — Progress Notes (Signed)
Summary: fyi- stomach pain going to er   Phone Note Call from Patient   Caller: Patient Summary of Call: Pt left VM that she would like a call back did not leave any other details..........Marland KitchenFelecia Deloach CMA  November 09, 2010 1:21 PM   Follow-up for Phone Call        spoke w/ patient says she is having alot of stomach pain at illeostomy sight and patient is concerned about an abscess or blockage notes a lot of fluid in bag and wanted to know if Dr. Laury Axon would order xray informed patient that she needs to be assess in emergency room now rather than later b/c patient ask for appt tomorrow but after given further recommendations patient agreed to go to emergency room .........Marland KitchenDoristine Devoid CMA  November 09, 2010 3:21 PM   Additional Follow-up for Phone Call Additional follow up Details #1::        agree----check on pt tomorrow Additional Follow-up by: Loreen Freud DO,  November 09, 2010 4:58 PM    Additional Follow-up for Phone Call Additional follow up Details #2::    Left message to call back.... Almeta Monas CMA Duncan Dull)  November 10, 2010 11:22 AM   Additional Follow-up for Phone Call Additional follow up Details #3:: Details for Additional Follow-up Action Taken: Pt husband return call  Pt went to ED on yesterday and was admitted.Pt has a intestinal blockage  which may require surgery if blockage is not clear with current antibiotics regimen..............Marland KitchenFelecia Deloach CMA  November 10, 2010 11:38 AM

## 2010-12-31 NOTE — Assessment & Plan Note (Signed)
Summary: still fatigued, will be fasting///sph   Vital Signs:  Patient profile:   60 year old female Weight:      126.4 pounds Temp:     97.9 degrees F oral Pulse rate:   76 / minute Pulse rhythm:   regular BP sitting:   102 / 66  (left arm) Cuff size:   regular  Vitals Entered By: Almeta Monas CMA Duncan Dull) (October 13, 2010 9:46 AM) CC: x3weeks c/o feeling really tired   History of Present Illness: Pt here c/o extreme fatigue again and hair falling out.  She states she felt this way when she had the parasite.  no other complaints.   Current Medications (verified): 1)  Percocet 7.5-325 Mg Tabs (Oxycodone-Acetaminophen) .Marland Kitchen.. 1 By Mouth Every 6 Hours  Allergies (verified): 1)  ! Asacol (Mesalamine)  Past History:  Past Medical History: Last updated: 09/09/2008 Ulcerative Colitis Mitral valve Prolapse Current Problems:  DIARRHEA (ICD-787.91) ANOREXIA (ICD-783.0) CANDIDIASIS OF SKIN AND NAILS (ICD-112.3) INJURY UNSPEC INTRA-ABD ORGN W/OPEN WOUND IN CAV (ICD-868.10) UNIVERSAL ULCERATIVE COLITIS (ICD-556.6) ABDOMINAL PAIN, GENERALIZED (ICD-789.07) DYSPHAGIA UNSPECIFIED (ICD-787.20) GERD (ICD-530.81) Current Problems:  LYME DISEASE, HX OF (ICD-V12.09) DIARRHEA (ICD-787.91) ANOREXIA (ICD-783.0) CANDIDIASIS OF SKIN AND NAILS (ICD-112.3) INJURY UNSPEC INTRA-ABD ORGN W/OPEN WOUND IN CAV (ICD-868.10) UNIVERSAL ULCERATIVE COLITIS (ICD-556.6) ABDOMINAL PAIN, GENERALIZED (ICD-789.07) DYSPHAGIA UNSPECIFIED (ICD-787.20) GERD (ICD-530.81)  Past Surgical History: Last updated: 09/20/2008 Ileostomy C-section Dr ingram--drained abscess and ileostomy 5/09  Family History: Last updated: 09/20/2008 Family History of Colitis/Crohn's:Sister (crohns) Stroke: Father No FH of Colon Cancer: M---  83yo and in good health  Social History: Last updated: 10/07/2009 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  Alcohol Use - no Illicit Drug Use - no Patient does not get regular  exercise.  Never Smoked  Risk Factors: Caffeine Use: 1/2 cup (10/10/2008) Exercise: no (05/22/2008)  Risk Factors: Smoking Status: never (10/10/2008) Passive Smoke Exposure: no (10/10/2008)  Family History: Reviewed history from 09/20/2008 and no changes required. Family History of Colitis/Crohn's:Sister (crohns) Stroke: Father No FH of Colon Cancer: M---  83yo and in good health  Social History: Reviewed history from 10/07/2009 and no changes required. 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  Alcohol Use - no Illicit Drug Use - no Patient does not get regular exercise.  Never Smoked  Review of Systems      See HPI  Physical Exam  General:  Well-developed,well-nourished,in no acute distress; alert,appropriate and cooperative throughout examination Ears:  External ear exam shows no significant lesions or deformities.  Otoscopic examination reveals clear canals, tympanic membranes are intact bilaterally without bulging, retraction, inflammation or discharge. Hearing is grossly normal bilaterally. Nose:  External nasal examination shows no deformity or inflammation. Nasal mucosa are pink and moist without lesions or exudates. Mouth:  Oral mucosa and oropharynx without lesions or exudates.  Teeth in good repair. Neck:  No deformities, masses, or tenderness noted. Lungs:  Normal respiratory effort, chest expands symmetrically. Lungs are clear to auscultation, no crackles or wheezes. Heart:  Normal rate and regular rhythm. S1 and S2 normal without gallop, murmur, click, rub or other extra sounds. Cervical Nodes:  No lymphadenopathy noted Psych:  Cognition and judgment appear intact. Alert and cooperative with normal attention span and concentration. No apparent delusions, illusions, hallucinations   Impression & Recommendations:  Problem # 1:  FATIGUE (ICD-780.79)  Orders: Venipuncture (04540) TLB-B12 + Folate Pnl (98119_14782-N56/OZH) TLB-BMP (Basic Metabolic  Panel-BMET) (80048-METABOL) TLB-CBC Platelet - w/Differential (85025-CBCD) TLB-Hepatic/Liver Function Pnl (80076-HEPATIC) TLB-TSH (Thyroid Stimulating Hormone) (84443-TSH) Specimen Handling (08657)  Problem # 2:  STRONGYLOIDIASIS (ICD-127.2)  Orders: T- * Misc. Laboratory test [phone]  Complete Medication List: 1)  Percocet 7.5-325 Mg Tabs (Oxycodone-acetaminophen) .Marland Kitchen.. 1 by mouth every 6 hours  Other Orders: T-Culture, C-Diff Toxin A/B ([id number])   Orders Added: 1)  Venipuncture [36415] 2)  TLB-B12 + Folate Pnl [82746_82607-B12/FOL] 3)  TLB-BMP (Basic Metabolic Panel-BMET) [80048-METABOL] 4)  TLB-CBC Platelet - w/Differential [85025-CBCD] 5)  TLB-Hepatic/Liver Function Pnl [80076-HEPATIC] 6)  TLB-TSH (Thyroid Stimulating Hormone) [84443-TSH] 7)  T- * Misc. Laboratory test [99999] 8)  Est. Patient Level III [95638] 9)  T- * Misc. Laboratory test [phone] 10)  T- * Misc. Laboratory test [99999] 11)  T-Culture, C-Diff Toxin A/B [[id number]] 12)  Specimen Handling [99000]

## 2010-12-31 NOTE — Assessment & Plan Note (Signed)
Summary: STILL HAS EAR PROBLEMS/KN   Vital Signs:  Patient profile:   60 year old female Height:      60 inches Weight:      123 pounds Temp:     98.3 degrees F oral Pulse rate:   66 / minute BP sitting:   100 / 66  (left arm)  Vitals Entered By: Jeremy Johann CMA (July 06, 2010 10:48 AM) CC: ear still draining, Ear pain   History of Present Illness:  Ear Pain      This is a 60 year old woman who presents with Ear pain.  The patient complains of ear discharge and sensation of fullness, but denies hearing loss, tinnitus, fever, sinus pain, nasal discharge, and jaw click.  The pain is located in the right ear.  The pain is described as constant and severe.  Associated symptoms include pressure.  The patient denies headache, night grinding of teeth, popping or crackling sounds, toothache, dizziness, and vertigo.  Prior treatment has included topical antibiotics and oral antibiotics.  See last visit  Current Medications (verified): 1)  Percocet 7.5-325 Mg Tabs (Oxycodone-Acetaminophen) .Marland Kitchen.. 1 By Mouth Every 6 Hours 2)  Ceftin 500 Mg Tabs (Cefuroxime Axetil) .Marland Kitchen.. 1 By Mouth Two Times A Day 3)  Hydrocortisone-Acetic Acid 1-2 % Soln (Hydrocortisone-Acetic Acid) .... 3-5 Drops Every 4 Hours  Allergies (verified): 1)  ! Asacol (Mesalamine)  Past History:  Past medical, surgical, family and social histories (including risk factors) reviewed for relevance to current acute and chronic problems.  Past Medical History: Reviewed history from 09/09/2008 and no changes required. Ulcerative Colitis Mitral valve Prolapse Current Problems:  DIARRHEA (ICD-787.91) ANOREXIA (ICD-783.0) CANDIDIASIS OF SKIN AND NAILS (ICD-112.3) INJURY UNSPEC INTRA-ABD ORGN W/OPEN WOUND IN CAV (ICD-868.10) UNIVERSAL ULCERATIVE COLITIS (ICD-556.6) ABDOMINAL PAIN, GENERALIZED (ICD-789.07) DYSPHAGIA UNSPECIFIED (ICD-787.20) GERD (ICD-530.81) Current Problems:  LYME DISEASE, HX OF (ICD-V12.09) DIARRHEA  (ICD-787.91) ANOREXIA (ICD-783.0) CANDIDIASIS OF SKIN AND NAILS (ICD-112.3) INJURY UNSPEC INTRA-ABD ORGN W/OPEN WOUND IN CAV (ICD-868.10) UNIVERSAL ULCERATIVE COLITIS (ICD-556.6) ABDOMINAL PAIN, GENERALIZED (ICD-789.07) DYSPHAGIA UNSPECIFIED (ICD-787.20) GERD (ICD-530.81)  Past Surgical History: Reviewed history from 09/20/2008 and no changes required. Ileostomy C-section Dr ingram--drained abscess and ileostomy 5/09  Family History: Reviewed history from 09/20/2008 and no changes required. Family History of Colitis/Crohn's:Sister (crohns) Stroke: Father No FH of Colon Cancer: M---  83yo and in good health  Social History: Reviewed history from 10/07/2009 and no changes required. 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  Alcohol Use - no Illicit Drug Use - no Patient does not get regular exercise.  Never Smoked  Review of Systems      See HPI  Physical Exam  General:  Well-developed,well-nourished,in no acute distress; alert,appropriate and cooperative throughout examination Ears:  R ear canal --+ errythema and swelling + white d/c  Nose:  External nasal examination shows no deformity or inflammation. Nasal mucosa are pink and moist without lesions or exudates. Mouth:  Oral mucosa and oropharynx without lesions or exudates.  Teeth in good repair. Psych:  Cognition and judgment appear intact. Alert and cooperative with normal attention span and concentration. No apparent delusions, illusions, hallucinations   Impression & Recommendations:  Problem # 1:  ROM (ICD-382.9)  Her updated medication list for this problem includes:    Ceftin 500 Mg Tabs (Cefuroxime axetil) .Marland Kitchen... 1 by mouth two times a day  Orders: ENT Referral (ENT) Admin of Therapeutic Inj  intramuscular or subcutaneous ([address]) Rocephin  250mg  (U0454)  Instructed on prevention and treatment. Call if  no improvement in 48-72 hours or sooner if worsening symptoms.   Problem # 2:  OTITIS EXTERNA,  ACUTE, RIGHT (ICD-380.12)  Her updated medication list for this problem includes:    Hydrocortisone-acetic Acid 1-2 % Soln (Hydrocortisone-acetic acid) .Marland Kitchen... 3-5 drops every 4 hours  Discussed symptomatic treatment and preventive measures.   Complete Medication List: 1)  Percocet 7.5-325 Mg Tabs (Oxycodone-acetaminophen) .Marland Kitchen.. 1 by mouth every 6 hours 2)  Ceftin 500 Mg Tabs (Cefuroxime axetil) .Marland Kitchen.. 1 by mouth two times a day 3)  Hydrocortisone-acetic Acid 1-2 % Soln (Hydrocortisone-acetic acid) .... 3-5 drops every 4 hours Prescriptions: HYDROCORTISONE-ACETIC ACID 1-2 % SOLN (HYDROCORTISONE-ACETIC ACID) 3-5 drops every 4 hours  #7 days x 0   Entered and Authorized by:   Loreen Freud DO   Signed by:   Loreen Freud DO on 07/06/2010   Method used:   [facility]        [facility]       [address]       [place], [place]  [address]       Ph: [phone]       Fax: [phone]   RxID:   [id number] PERCOCET 7.5-325 MG TABS (OXYCODONE-ACETAMINOPHEN) 1 by mouth every 6 hours  #30 x 0   Entered and Authorized by:   Loreen Freud DO   Signed by:   Loreen Freud DO on 07/06/2010   Method used:   Print then Give to Patient   RxID:   [id number]    Medication Administration  Injection # 1:    Medication: Rocephin  250mg     Diagnosis: ROM (ICD-382.9)    Route: IM    Site: LUOQ gluteus    Exp Date: 02/28/2012    Lot #: [id number]    Mfr: novaplus    Comments: pt given 1 gram    Given by: Jeremy Johann CMA (July 06, 2010 12:08 PM)  Injection # 3:    Medication: Rocephin  250mg     Comments: pt given 1 gram    Patient tolerated injection without complications    Given by: Jeremy Johann CMA (July 06, 2010 12:06 PM)  Orders Added: 1)  ENT Referral [ENT] 2)  Admin of Therapeutic Inj  intramuscular or subcutaneous [96[address]] 3)  Rocephin  250mg  [J0696] 4)  Est. Patient Level III [74259]

## 2010-12-31 NOTE — Assessment & Plan Note (Signed)
Summary: eye/ear infection/kn   Vital Signs:  Patient profile:   60 year old female Height:      60 inches Weight:      122 pounds BMI:     23.91 Temp:     98.6 degrees F oral Pulse rate:   74 / minute BP sitting:   100 / 76  (left arm)  Vitals Entered By: Jeremy Johann CMA (July 03, 2010 8:15 AM) CC: eye and ear infection, Ear pain   History of Present Illness:  Ear Pain      This is a 60 year old woman who presents with Ear pain.  The symptoms began 1 week ago.  Pt c/o tearing L eye and pain in R ear for several days.  No congestion.  The patient complains of sensation of fullness, but denies ear discharge, hearing loss, tinnitus, fever, sinus pain, nasal discharge, and jaw click.  The pain is located in the right ear.  The pain is described as constant.  The patient denies headache, night grinding of teeth, popping or crackling sounds, pressure, toothache, dizziness, and vertigo.    Pt  states she was told in past she had sleep apnea and does snore at night but she has never had a sleep study.    Current Medications (verified): 1)  Ibudone 5-200 Mg Tabs (Hydrocodone-Ibuprofen) .... One Tablet Every 6 Hours As Needed For Severe Pain 2)  Ceftin 500 Mg Tabs (Cefuroxime Axetil) .Marland Kitchen.. 1 By Mouth Two Times A Day 3)  Floxin Otic Drops .Marland Kitchen.. 10 Drops R Ear X 10 Days  Allergies (verified): 1)  ! Asacol (Mesalamine)  Past History:  Past medical, surgical, family and social histories (including risk factors) reviewed for relevance to current acute and chronic problems.  Past Medical History: Reviewed history from 09/09/2008 and no changes required. Ulcerative Colitis Mitral valve Prolapse Current Problems:  DIARRHEA (ICD-787.91) ANOREXIA (ICD-783.0) CANDIDIASIS OF SKIN AND NAILS (ICD-112.3) INJURY UNSPEC INTRA-ABD ORGN W/OPEN WOUND IN CAV (ICD-868.10) UNIVERSAL ULCERATIVE COLITIS (ICD-556.6) ABDOMINAL PAIN, GENERALIZED (ICD-789.07) DYSPHAGIA UNSPECIFIED (ICD-787.20) GERD  (ICD-530.81) Current Problems:  LYME DISEASE, HX OF (ICD-V12.09) DIARRHEA (ICD-787.91) ANOREXIA (ICD-783.0) CANDIDIASIS OF SKIN AND NAILS (ICD-112.3) INJURY UNSPEC INTRA-ABD ORGN W/OPEN WOUND IN CAV (ICD-868.10) UNIVERSAL ULCERATIVE COLITIS (ICD-556.6) ABDOMINAL PAIN, GENERALIZED (ICD-789.07) DYSPHAGIA UNSPECIFIED (ICD-787.20) GERD (ICD-530.81)  Past Surgical History: Reviewed history from 09/20/2008 and no changes required. Ileostomy C-section Dr ingram--drained abscess and ileostomy 5/09  Family History: Reviewed history from 09/20/2008 and no changes required. Family History of Colitis/Crohn's:Sister (crohns) Stroke: Father No FH of Colon Cancer: M---  83yo and in good health  Social History: Reviewed history from 10/07/2009 and no changes required. 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  Alcohol Use - no Illicit Drug Use - no Patient does not get regular exercise.  Never Smoked  Review of Systems      See HPI  Physical Exam  General:  Well-developed,well-nourished,in no acute distress; alert,appropriate and cooperative throughout examination Eyes:  L eye--sclera injected and tearing Ears:  R ear + errythema TM and canal  Nose:  External nasal examination shows no deformity or inflammation. Nasal mucosa are pink and moist without lesions or exudates. Mouth:  Oral mucosa and oropharynx without lesions or exudates.  Teeth in good repair. Neck:  No deformities, masses, or tenderness noted. Lungs:  Normal respiratory effort, chest expands symmetrically. Lungs are clear to auscultation, no crackles or wheezes. Heart:  Normal rate and regular rhythm. S1 and S2 normal without gallop, murmur, click, rub  or other extra sounds. Cervical Nodes:  No lymphadenopathy noted Psych:  Cognition and judgment appear intact. Alert and cooperative with normal attention span and concentration. No apparent delusions, illusions, hallucinations   Impression &  Recommendations:  Problem # 1:  ROM (ICD-382.9)  Her updated medication list for this problem includes:    Ceftin 500 Mg Tabs (Cefuroxime axetil) .Marland Kitchen... 1 by mouth two times a day    Floxin otic drops   Instructed on prevention and treatment. Call if no improvement in 48-72 hours or sooner if worsening symptoms.   Problem # 2:  SLEEP APNEA, OBSTRUCTIVE (ICD-327.23)  Orders: Sleep Disorder Referral (Sleep Disorder)  Complete Medication List: 1)  Ibudone 5-200 Mg Tabs (Hydrocodone-ibuprofen) .... One tablet every 6 hours as needed for severe pain 2)  Ceftin 500 Mg Tabs (Cefuroxime axetil) .Marland Kitchen.. 1 by mouth two times a day 3)  Floxin Otic Drops  .Marland Kitchen.. 10 drops r ear x 10 days Prescriptions: FLOXIN OTIC DROPS 10 drops R ear x 10 days  #10 days x 0   Entered and Authorized by:   Loreen Freud DO   Signed by:   Loreen Freud DO on 07/03/2010   Method used:   [facility]ed to ...       [facility]       [address]       [place], [place]  [address]       Ph: [phone]       Fax: [phone]   RxID:   [id number] CEFTIN 500 MG TABS (CEFUROXIME AXETIL) 1 by mouth two times a day  #20 x 0   Entered and Authorized by:   Loreen Freud DO   Signed by:   Loreen Freud DO on 07/03/2010   Method used:   Electronically to        [facility]       [address]       [place], [place]  [address]       Ph: [phone]       Fax: [phone]   RxID:   [id number] IBUDONE 5-200 MG TABS (HYDROCODONE-IBUPROFEN) one tablet every 6 hours as needed for severe pain  #20 x 0   Entered and Authorized by:   Loreen Freud DO   Signed by:   Loreen Freud DO on 07/03/2010   Method used:   Print then Give to Patient   RxID:   [id number]

## 2010-12-31 NOTE — Progress Notes (Signed)
Summary: urine  Phone Note Call from Patient Call back at Home Phone [phone]   Caller: Patient Summary of Call: Pt coming to drop off stool culture on tomorrow and would like to know if it would be possible for [place] to run urine culture as well. Pt states that she is not having any symptoms except for fatigue which she is currently being treated for. Pt live so far away and would like to have this done while she is here. pls advise...........Marland KitchenFelecia Deloach CMA  October 26, 2010 3:12 PM   Follow-up for Phone Call        I need a code to put in computer or insurance will not pay for it----i don't know if they will pay for fatigue dx.   Follow-up by: Loreen Freud DO,  October 26, 2010 4:12 PM  Additional Follow-up for Phone Call Additional follow up Details #1::        pt aware...............Marland KitchenFelecia Deloach CMA  October 26, 2010 5:06 PM

## 2010-12-31 NOTE — Progress Notes (Signed)
Summary: Ibudone change  Phone Note From Pharmacy   Summary of Call: Pt pharmacy called stating that the pt brought in rx fo Ibudone 5-200 and they do note have that dosage. They only have the  7.5-200. Is it ok to change? Please advise. Initial call taken by: Lucious Groves CMA,  July 03, 2010 9:44 AM  Follow-up for Phone Call        Pharmacy notified ok to change  per MD.     New/Updated Medications: * IBUDONE 7.5-200 MG TABS (HYDROCODONE-IBUPROFEN) one tablet every 6 hours as needed for severe pain

## 2011-02-08 LAB — MAGNESIUM: Magnesium: 1.9 mg/dL (ref 1.5–2.5)

## 2011-02-08 LAB — BASIC METABOLIC PANEL
BUN: 2 mg/dL — ABNORMAL LOW (ref 6–23)
Calcium: 8.6 mg/dL (ref 8.4–10.5)
Chloride: 103 mEq/L (ref 96–112)
Creatinine, Ser: 0.59 mg/dL (ref 0.4–1.2)
GFR calc Af Amer: >60 mL/min (ref >60)
GFR calc non Af Amer: >60 mL/min (ref >60)

## 2011-02-08 LAB — CBC
MCV: 96.1 fL (ref 78.0–100.0)
Platelets: 337 10*3/uL (ref 150–400)
RBC: 3.82 MIL/uL — ABNORMAL LOW (ref 3.87–5.11)
RDW: 13.7 % (ref 11.5–15.5)
WBC: 9.8 10*3/uL (ref 4.0–10.5)

## 2011-02-09 LAB — CBC
MCH: 30.9 pg (ref 26.0–34.0)
MCH: 31.2 pg (ref 26.0–34.0)
Platelets: 339 10*3/uL (ref 150–400)
Platelets: 416 10*3/uL — ABNORMAL HIGH (ref 150–400)
RBC: 3.69 MIL/uL — ABNORMAL LOW (ref 3.87–5.11)
RBC: 4.59 MIL/uL (ref 3.87–5.11)
WBC: 11.3 10*3/uL — ABNORMAL HIGH (ref 4.0–10.5)

## 2011-02-09 LAB — CULTURE, BLOOD (ROUTINE X 2)
Culture  Setup Time: 201112130431
Culture: NO GROWTH

## 2011-02-09 LAB — URINALYSIS, ROUTINE W REFLEX MICROSCOPIC
Bilirubin Urine: NEGATIVE
Glucose, UA: NEGATIVE mg/dL
Ketones, ur: 15 mg/dL — AB
pH: 6 (ref 5.0–8.0)

## 2011-02-09 LAB — COMPREHENSIVE METABOLIC PANEL
ALT: 19 U/L (ref 0–35)
AST: 26 U/L (ref 0–37)
Albumin: 4.3 g/dL (ref 3.5–5.2)
Calcium: 9.8 mg/dL (ref 8.4–10.5)
Chloride: 102 mEq/L (ref 96–112)
Creatinine, Ser: 0.81 mg/dL (ref 0.4–1.2)
GFR calc Af Amer: >60 mL/min (ref >60)
Sodium: 140 mEq/L (ref 135–145)
Total Bilirubin: 1 mg/dL (ref 0.3–1.2)

## 2011-02-09 LAB — DIFFERENTIAL
Eosinophils Relative: 1 % (ref 0–5)
Lymphocytes Relative: 10 % — ABNORMAL LOW (ref 12–46)
Lymphs Abs: 1.7 10*3/uL (ref 0.7–4.0)
Monocytes Absolute: 0.8 10*3/uL (ref 0.1–1.0)

## 2011-02-09 LAB — URINE MICROSCOPIC-ADD ON

## 2011-03-02 ENCOUNTER — Telehealth: Payer: Self-pay | Admitting: *Deleted

## 2011-03-02 MED ORDER — DOXYCYCLINE HYCLATE 100 MG PO CAPS: 100.0000 mg | ORAL_CAPSULE | Freq: Two times a day (BID) | ORAL | Status: AC

## 2011-03-02 NOTE — Telephone Encounter (Signed)
DOXYCYCLINE 100MG  1 PO BID X10 DAYS ---BUT SHE MUST BE SEEN SOMEWHERE IF SYMPTOMS WORSEN OR DO NOT START TO IMPROVE IN 3-4 DAYS

## 2011-03-02 NOTE — Telephone Encounter (Signed)
Pt c/o rash on abdomin and notes that the last time she had this a Rx for doxycycline 100 mg two times a day for 10 days was given. Pt is requesting Rx for this again. Pt last seen for this on 09-09-08, last OV 10-13-10 acute. Pt advised that she needs OV or local UC/ED. Pt unable to come in for OV due to being out of town taking care of her mom who is 56 and just had a bad fall due to med reaction. Pt is scheduled to be there for a couple more weeks and is unable to leave mom along to go to UC/ED. pls advise.

## 2011-03-02 NOTE — Telephone Encounter (Signed)
Pt aware Rx sent to pharmacy 

## 2011-04-13 NOTE — Group Therapy Note (Signed)
[name], PLANT            ACCOUNT NO.:  [id number]   MEDICAL RECORD NO.:  [id number]          PATIENT TYPE:  INP   LOCATION:  1438                         FACILITY:  [facility]   PHYSICIAN:  Michelene Gardener, MD    DATE OF BIRTH:  [birth year]                                 PROGRESS NOTE   Initial discharge summary was dictated on July 23, 2008.   CURRENT DIAGNOSES:  1. Left subphrenic abscess with recurrence.  2. Exudative left pleural effusion status post thoracentesis.  3. Left ovarian vein thrombosis.  4. Hypotension.  5. Protein calorie malnutrition.  6. Anemia.  7. History of ulcerative colitis status post subtotal colectomy with      ileostomy bag.  8. Previous ileostomy bag output which is improved.  9. Electrolyte imbalance that resolved.  10.Deconditioning.   CONSULTATION:  Please check previous discharge summary.   PROCEDURES:  The procedures done up to July 23, 2008 were dictated before by Dr.  Ebony Cargo.  Since that time the patient has the following procedures:   1. Chest x-ray on July 24, 2008 showed left lower lobe collapse with      minimal left pleural effusion.  2. CT scan of the abdomen with contrast on July 24, 2008 showed      recurrent subphrenic abscess on the left.  3. CT scan of the pelvis with contrast on July 24, 2008 showed no      acute change, showed thrombosed left ovarian vein as seen before.   HOSPITAL COURSE:  The course of hospitalization up to July 23, 2008 was dictated before  by Dr. Ebony Cargo.  Please refer to that discharge summary for further  details.  Since that time, events as follow:   1. Subphrenic abscess.  That, patient as mentioned before, has been      treated with vanco, zosyn and Flagyl.  Those were discontinued on      July 22, 2008.  The patient was switched to p.o. Avelox.      Following that, the patient developed fever and white count      increased to 17,000.  Sepsis workup was sent with UA,  blood      culture, chest x-ray, repeat CT scan of the abdomen and pelvis.  UA      and urine culture came to be negative.  Blood cultures were      negative x2.  Chest x-ray only showed atelectesis, but no evidence      of infection.  The CT scan of the abdomen and pelvis showed      recurrence of the subphrenic abscess.  The patient was restarted      back on the aggressive IV antibiotics that include vancomycin,      Zosyn and Flagyl and those were restarted on July 24, 2008.      Antibiotics have been adjusted by infectious disease.  Further      recommendations will be done by them.  The patient was also      continued on TNA.  Currently the patient is tolerating po bette.  Plan is to increase p.o. intake and to taper down the TNA.      Interventional radiology was re-consulted, but the abscess was very      small to be drained.  The patient was treated medically.  Currently      the patient is still on aggressive IV antibiotic treatment with      vancomycin, Zosyn and Flagyl, still taking TNA with increasing oral      intake.  Plan is to repeat CT of the abdomen today and further      plans will depend on the findings.  2. Hypotension.  Systolic blood pressure remains in the range of 90 up      to 100.  That held the patient very well.  She remains asymptomatic      and that is possibly her baseline.  For now will continue her on IV      fluids while in the hospital.  3. Left ovarian thrombosis.  The patient was started on Heparin and      then Coumadin was started 2 days ago.  Current INR is still      subtherapeutic and dosing has been done by pharmacy.  Her goal INR      will be 2 to 3.   DISPOSITION:  This patient is currently stable.  She is still on IV antibiotics which  has been adjusted mainly by infectious disease.  Also has been followed  by GI and surgery regarding her subphrenic abscess.  As mentioned above,  plan is to repeat CT today and then further  recommendations will depend  on findings.  Updates on the rest on her hospitalization, will be  dictated by the discharging physician.   TOTAL ASSESSMENT TIME:  40 minutes.      Michelene Gardener, MD  Electronically Signed     NAE/MEDQ  D:  07/29/2008  T:  07/29/2008  Job:  [id number]

## 2011-04-13 NOTE — Discharge Summary (Signed)
[name], [name]            ACCOUNT NO.:  [id number]   MEDICAL RECORD NO.:  [id number]          PATIENT TYPE:  INP   LOCATION:  5507                         FACILITY:  [facility]   PHYSICIAN:  Jordan Hawks. Elnoria Howard, MD    DATE OF BIRTH:  July 04, 1951   DATE OF ADMISSION:  11/14/2007  DATE OF DISCHARGE:  11/22/2007                               DISCHARGE SUMMARY   DISCHARGE DIAGNOSIS:  Fulminant ulcerative colitis.   DISCHARGE DIAGNOSIS:  Fulminant ulcerative colitis.   HISTORY AND PHYSICAL:  Please see the original H&P for full details.   HOSPITAL COURSE:  The patient was admitted to the hospital on November 14, 2007.  At that time she had a significant amount of abdominal pain  as well as bloody diarrhea.  She was started on Solu-Medrol 40 mg IV  q.12h. as well as Asacol 1600 mg p.o. t.i.d.  It was uncertain if the  patient also had a concomitant infectious etiology.  Therefore  ciprofloxacin and Flagyl were also started on the patient.  Over the  course of the hospitalization she continued to improve, however, she  stated that she had a significant amount of abdominal pain and bloating  secondary to the Asacol.  Her Asacol was dose reduced to 50%, however,  she continued to have problems and subsequently she was discontinued  from the medication completely.  This resolved her abdominal pain.  Additionally the patient still did not feel quite well with aggressive  IV hydration.  She was started on TPN.  Fortunately, she was able to  have a significant response the following day and she felt back to her  baseline, although she continued to have some loose bowel movements and  she continued to improve and her diet was increased from a liquid diet  to a regular diet.  One day prior to discharge, she was able to tolerate  a regular diet without any difficulty and there is no further evidence  of any hematochezia.  Her bowel movements ranged from three to four per  day, however, there is no  associated pain and the bowel movements were  small in volume. C. difficile toxin was obtained from her and there was  no evidence of C. difficile and after the length of her hospitalization,  the antibiotics were stopped one day prior to discharge.  Her vital  signs remained stable as well as her electrolyte.  Her CBC did  demonstrated thrombocytosis, however, on the day of discharge it had  markedly declined to 393 which indicated that her inflammation was  responding to the treatment.  On the date of discharge, the patient is  well and desired to be released home.   Plan at this time is for the patient to follow up on November 27, 2007,  with Dr. Elnoria Howard.  She is continued on prednisone 40 mg p.o. daily.  Omeprazole was also provided for her as she did have some nonspecific  complaints of gastroesophageal reflux disease and Percocet was also  provided for her, although she denied having any further pain after  being discontinued from Asacol.  Jordan Hawks Elnoria Howard, MD  Electronically Signed     PDH/MEDQ  D:  11/22/2007  T:  11/22/2007  Job:  [id number]

## 2011-04-13 NOTE — Op Note (Signed)
NAMESHELISHA, [name]            ACCOUNT NO.:  [id number]   MEDICAL RECORD NO.:  [id number]          PATIENT TYPE:  AMB   LOCATION:  ENDO                         [facility]ACILITY:  [facility]   PHYSICIAN:  Barbette Hair. [name], MD,FACGDATE OF BIRTH:  [birth year]   DATE OF PROCEDURE:  DATE OF DISCHARGE:  12/06/2008                               OPERATIVE REPORT   PROCEDURE PERFORMED:  48 hour pH study.   The 48 hour Bravo pH study was performed with the patient off all  medications.   On day 1 there were 111 episodes of reflux, 105 in the upright position  and 70 postprandially.  Total time of pH less than 4 was 164 minutes.  Total Demeester score was 37.5.   On day 2 there were 61 episodes of reflux, 51 in the upright position  and 39 postprandially.  Time of pH less than 4 was 503 minutes and  Demeester score was 160.   Findings are consistent with significant gastroesophageal reflux.   The patient's diary did not indicate any symptoms.      Barbette Hair. [name], MD,FACG  Electronically Signed     RDK/MEDQ  D:  12/11/2008  T:  12/11/2008  Job:  [id number]

## 2011-04-13 NOTE — H&P (Signed)
NAMEPIERA, DOWNS            ACCOUNT NO.:  [id number]   MEDICAL RECORD NO.:  [id number]          PATIENT TYPE:  AMB   LOCATION:  ENDO                         FACILITY:  [facility]   PHYSICIAN:  Barbette Hair. [name], MD,FACGDATE OF BIRTH:  09/13/1951   DATE OF ADMISSION:  04/12/2008  DATE OF DISCHARGE:                              HISTORY & PHYSICAL   CHIEF COMPLAINT:  Abdominal distention and pain x3 days.   HISTORY:  Anzley is a pleasant 60 year old white female who had a  recent admission to [facility] April 23-30, 2009, for refractory pan-  ulcerative colitis.  She had initially been seen by Drs. Elnoria Howard and Crittenden  in December 2008 and at that time was felt to have a pan-ulcerative  colitis.  The terminal ileum was normal.  Biopsies were performed and  she also has a stool for C. difficile which was negative.  Pathology on  her of colon biopsies was consistent with ulcerative colitis.  Also she  was treated with a course of steroids and discharged on 40 mg of  prednisone daily.  She was tried on Asacol, which she felt increased her  diarrhea, and this was discontinued.  She was seen initially as [facility] in April 2009 and at that point had been off of all medication over  the past 2 months.  She stated that she had also been given a short  trial of Lialda though was intolerant to that as well.  She reported  tapering off of her steroids sometime in February and then had had  gradual increase in her stooling with diarrheal stools streaked with  blood.  She also complained of abdominal cramping and discomfort,  nausea, occasional vomiting.  She was concerned at that point about  having a day helminth infection and had actually had a culture showing a  Strongyloides antibody indeterminate in March.  She asked to be treated  for this possibility.  We started her back on prednisone at 20 mg per  day and also gave her a 5-day course mebendazole and Robinul for  abdominal cramping.   TPMT enzyme studies were ordered with anticipation  of starting her on 6-MP.  She failed therapy outpatient with oral  steroids and was then admitted to the hospital on April 23 with  worsening diarrhea and abdominal pain.  She continued to complain of a  raw, irritated, shredded sort of feeling in her throat as well.  It was  felt that she had oral thrush or oropharyngeal thrush and was started on  a course of Diflucan, which she did complete.  During her admission she  was treated with Solu-Medrol.  Had plain abdominal films done showing  mildly dilated splenic flexure but no free air.  She also had a barium  swallow done during that admission on the 29th due to persistent  complaints of dysphagia despite the Diflucan.  There was no stricture or  obstruction noted.  A 13-mm barium tablet passed without difficulty.  She was felt to have a component of dysmotility with stasis in the  valleculae.  She was discharged to home on  March 28, 2008, actually with  a Duragesic patch as none of the oral medicines seemed to offer her any  benefit,Robinul Forte 2 mg b.i.d. and prednisone 40 mg q.a.m.  She had  completed her course of Diflucan while she was hospitalized.  Since, she  returned to see Dr. [name] on Monday, May 11, and continued to complain  of mild diarrhea with 2-3 bowel movements per day.  She stated she had  not been seeing any blood, had been off of the Duragesic patch and was  using Vicodin as needed for pain.  She continued to complain of  dysphagia and raw, irritated feeling in her mouth and throat and was  therefore scheduled for upper endoscopy today on May 15.   On arrival to the endoscopy unit and preprocedure examination, she  complained of progressive abdominal distention over the past 3 days.  She says this started on Tuesday and that she has also had progressive  abdominal pain.  She has not been a eating much, taking some liquids.  Denies any fever or chills, has not had  any vomiting and has not noted  any blood in her stools.  A KUB was obtained prior to proceeding with  endoscopy and, unfortunately, this shows free air consistent with a  perforated viscus and at this time she is to be admitted for surgical  consultation urgently, and we have placed the endoscopy on hold.   CURRENT MEDICATIONS:  1. Vicodin 5/500 mg one every 4-6 hours as needed for pain.  2. Prednisone is currently on 10 mg p.o. daily.  3. Protonix 40 mg daily.   ALLERGIES:  Intolerance to mesalamine with cramping and diarrhea.   PAST HISTORY:  The patient was initially diagnosed with ulcerative  colitis apparently in the 1970s and then had been in remission for many  years.  Has had 2 flares since, the last in the late 1990s and then in  December 2008.  She has had a bilateral tubal ligation, C-sections and  has a history of mitral valve prolapse.  She also had been treated for  Lyme disease in the past.   SOCIAL HISTORY:  The patient is married.  She has four children.  [place]  [place].  No tobacco and no EtOH regularly.   FAMILY HISTORY:  Negative for GI disease.  Positive for coronary artery  disease.   REVIEW OF SYSTEMS:  CARDIOVASCULAR:  The patient denies any chest pain  or anginal symptoms.  PULMONARY:  Negative for cough, shortness of  breath or sputum production.  GENITOURINARY:  Negative for dysuria,  urgency or frequency.  GI: As outlined above.  MUSCULOSKELETAL:  Positive for generalized weakness.  HEENT:  As outlined above with  persistent pharyngeal irritation.  NEUROPSYCHIATRIC:  Negative.  All  other review of systems negative.   PHYSICAL EXAM:  A well-developed, pale, ill-appearing white female,  alert and oriented x3.  She is afebrile.  Pulses in the 90s.  Blood pressure 100/70.  HEENT:  Nontraumatic, normocephalic.  EOMI, PERRLA.  Sclerae anicteric.  Mouth is dry.  Mucosa of the tongue and buccal mucosa with small  yellowish plaques  consistent with thrush.  No JVD.  No bruit.  CARDIOVASCULAR:  Regular rate and rhythm with S1 and S2, slightly  tachycardic.  PULMONARY:  Clear to A&P.  ABDOMEN:  Distended, diffusely tender.  Bowel sounds are quiet.  She  does have rebound.  No palpable mass or hepatosplenomegaly.  RECTAL:  Exam not done day.  EXTREMITIES:  Without clubbing, cyanosis or edema.  NEUROLOGIC:  The patient is alert and oriented.  Exam is grossly  nonfocal.   IMPRESSION:  78. A 61 year old white female with known pan-ulcerative colitis, now      presenting with a perforated viscus likely related to her      ulcerative colitis.  2. Steroid dependence.  3. Anemia, stable.  4. Persistent dysphagia and oropharyngeal complaints.  Rule out      possibility of Crohn disease with upper gut involvement.  Rule out      refractory candidiasis.  5. Hypoalbuminemia/malnutrition secondary to inflammatory bowel      disease.   PLAN:  The patient is admitted to the service of Dr. of Melvia Heaps.  She will be kept n.p.o.  Have obtained urgent surgical consultation,  started IV Zosyn.  She will be placed on IV PPI and we will also treat  her with another course of Diflucan IV in the short term and continue  her on stress-dose steroids and for details, please see the orders.      Amy Esterwood, PA-C      Robert D. [name], MD,FACG  Electronically Signed    AE/MEDQ  D:  04/12/2008  T:  04/12/2008  Job:  [id number]   cc:   Angelia Mould. Derrell Lolling, M.D.  [address]  [place]  [place] [address]

## 2011-04-13 NOTE — Discharge Summary (Signed)
[name], [name]            ACCOUNT NO.:  [id number]   MEDICAL RECORD NO.:  [id number]          PATIENT TYPE:  INP   LOCATION:  1518                         FAC[facility]   PHYSICIAN:  Iva Boop, MD,FACGDATE OF BIRTH:  [birth year]   DATE OF ADMISSION:  03/21/2008  DATE OF DISCHARGE:  03/28/2008                               DISCHARGE SUMMARY   ADMITTING DIAGNOSES:  55. A 60 year old white female with known pan-ulcerative colitis      refractory to outpatient management.  2. Nausea and anorexia and a sore throat, suspected Candida      pharyngitis.  3. History of mitral valve prolapse.  4. Status post bilateral tubal ligation and cesarean section.  5. Anemia secondary to above.   DISCHARGE DIAGNOSES:  51. A 60 year old white femalee with pan-ulcerative colitis with      exacerbation, improving.  2. Anemia, stable status post transfusion.  3. Dysphagia felt secondary to a component of esophageal dysmotility,      persistent pharyngeal symptoms of unclear etiology.  .  4. History of mitral valve prolapse.  5. Status post bilateral tubal ligation and cesarean section.  6. Malnutrition with hypoalbuminemia.   CONSULTATIONS:  None.   PROCEDURES:  1. Plain abdominal films.  2. Barium swallow.   BRIEF HISTORY:  [name] is a 60 year old white female with known  history of ulcerative colitis.  She had presented with an exacerbation  earlier in April and was started on prednisone 20 mg orally.  She also  was treated at that time for with mebendazole for possible helminth  infection.  Apparently she inadvertently only started on 10 mg of  prednisone per day, continued to have diarrhea and some hematochezia,  which actually has gradually improved.  She was seen in the office by  Dr. [name] on April 21 and was told to increase her prednisone to 20 mg  per day.  She had also complained of a sore throat with a raw, irritated  feeling and it was felt that this may have been  secondary to Candida and  she was started on a course of Diflucan.  She complains of having no  appetite, nausea and vomiting intermittently, progressive weakness and a  weight loss of 6 pounds over the past couple of weeks.  She had called  the office stating that she was feeling no better and at this time is  admitted for medical management, IV steroids, etc.   LABORATORY STUDIES:  On admission, WBC of 14, hemoglobin 8.2, hematocrit  of 24.9, MCV of 84, platelets 929.  Follow-up on the 24th:  WBC of 12.9,  hemoglobin 6.7, hematocrit of 20.  She was transfused and on the 26,  hemoglobin 11, hematocrit of 33.3, and on the 27th hemoglobin 9.6,  hematocrit of 28.6.  Sed rate 94 on admission.  Electrolytes within  normal limits.  Glucose 109, BUN 18, creatinine 0.75, albumin 1.8.  LFTs  otherwise normal.  Prealbumin quite low at 4.8 on admission.   X-RAY STUDIES:  Chest x-ray on April 23:  No active process.  Plain  abdominal films on the 24th showed  a mildly dilated splenic flexure with  mucosal thickening, and a barium swallow on April 29 showed esophageal  dysmotility and possible esophageal fold thickening.  Barium tablet  passed into the stomach without difficulty.  Swallowing mechanism showed  of mild vallecular stasis.   HOSPITAL COURSE:  The patient was placed on IV fluids, started on IV  Solu-Medrol at 40 mg per day.  She did have quite a bit of pain and  cramping and was placed on IV morphine.  We also continued her course of  Diflucan.  She was slow to respond to the steroids.  Hemoglobin drifted  and she required transfusion of 2 units of packed RBCs and seemed to  feel much better post transfusion.  She had persistent swallowing and  pharyngeal complaints which did not seem to respond to the Diflucan,  though we did complete a course.  She had a barium swallow with findings  as outlined above.  We were able to gradually advance her diet,  eventually switched her to a  Duragesic patch at 25 mcg, and this seemed  to control her pain well and by April 30 she was ready for discharge to  home in a stable and improved condition.  She was not really having any  diarrhea at that point, no nausea and vomiting.  Her swallowing was  about the same.   The patient was to follow up with Dr. [name] on May 8 at 2 p.m. and to  call for any problems in the interim.   She was discharged on:  1. Prednisone 40 mg q.a.m.  2. Duragesic patch 25 mcg every 72 hours.  3. Robinul Forte.      Amy Esterwood, PA-C      Iva Boop, MD,FACG  Electronically Signed    AE/MEDQ  D:  04/09/2008  T:  04/09/2008  Job:  [id number]   cc:   Barbette Hair. [name], MD,FACG  [address]  [place]  [place] [address]

## 2011-04-13 NOTE — Assessment & Plan Note (Signed)
[facility]                         GASTROENTEROLOGY OFFICE NOTE   NAME:Davern, JILLIANN SUBRAMANIAN                   MRN:          [id number]  DATE:03/19/2008                            DOB:          January 05, 1951    PROBLEM:  1. Rectal bleeding.  2. Diarrhea.   Mrs. Blecher has returned for ongoing evaluation of her ulcerative  colitis.  Though prescribed 20 mg of prednisone, she was only taking 10  mg a day.  Cramping continues.  The diarrhea is less bloody, though it  continues several times a day and also awakens her at night.  She  complains of very dry throat with soreness with a feeling of shredded  wheat.  She continues to take Vicodin because of pain.  She denies  dysphagia or odynophagia.   PHYSICAL EXAMINATION:  She is a well-developed, well-nourished female.  Pulse 106.  Blood pressure 92/42.  Weight 113.  On oral exam, she has a very dry tongue without frank plaques, although  there is roughness to the edge of her tongue raising the question of  yeast infection.  There are no other frank lesions.   IMPRESSION:  1. Persistence of ulcerative colitis on suboptimal dose of prednisone.  2. Questionable candida infection.   RECOMMENDATION:  1. Increase prednisone to 20 mg a day.  I carefully instructed Mrs.      Reindel to contact me in 4 days to report her progress.  2. Fluconazole 100 mg daily for 7 days.  3. We will begin immunosuppressive therapy once her symptoms are under      better control.     Barbette Hair. [name], MD,FACG  Electronically Signed    RDK/MedQ  DD: 03/19/2008  DT: 03/19/2008  Job #: [id number]

## 2011-04-13 NOTE — H&P (Signed)
NAMESHANEICE, BARSANTI            ACCOUNT NO.:  [id number]   MEDICAL RECORD NO.:  [id number]          PATIENT TYPE:  INP   LOCATION:  5507                         FACILITY:  [facility]   PHYSICIAN:  Jordan Hawks. Elnoria Howard, MD    DATE OF BIRTH:  1951-03-29   DATE OF ADMISSION:  11/14/2007  DATE OF DISCHARGE:                              HISTORY & PHYSICAL   REASON FOR ADMISSION:  Severe pan ulcerative colitis, dehydration and  abdominal pain.   HISTORY OF PRESENT ILLNESS:  This is a 60 year old female with a past  medical history of ulcerative colitis since the age of 57 who is  admitted to the hospital with above findings of a severe pancolitis.  The patient reported having acute onset of diarrhea associated with pain  and then followed by bloody mucous diarrhea approximately 2 weeks ago.  One week prior to her symptoms she was started on antibiotic eye drops  for an eye infection.  Otherwise, she denies taking any medications and  she has only required treatment with prednisone on two occasions, once  at the time of diagnosis and the second time when she was evaluated by  Dr. Loreta Ave 11 years ago.  Since that time she has done well.  She denies  any sick contacts, [place] and she feels that  there might have been some exposure to farm animal pathogens.  Because  of the symptoms, she underwent a colonoscopy by Dr. Loreta Ave and revealed a  severe pan ulcerative colitis.  The terminal ileum is normal in  appearance.  Multiple biopsies were performed.  She also had C.  difficile toxin was performed but these were negative on outpatient  basis.  Because of the severity of her symptoms, her dehydration,  abdominal pain, tachycardia, she was ordered to present to the hospital  for further evaluation and treatment.   PAST MEDICAL HISTORY AND PAST SURGICAL HISTORY:  As stated above with  the addition of Lyme's disease.   FAMILY HISTORY:  Noncontributory.   SOCIAL HISTORY:  The  patient is married, has four children.  No tobacco  or illicit drug use.  Positive for social alcohol use.   ALLERGIES:  No known drug allergies.   REVIEW OF SYSTEMS:  As stated above in history of present illness,  otherwise negative.   MEDICATIONS:  None.   PHYSICAL EXAMINATION:  VITAL SIGNS:  Pending at this time.  GENERAL:  The patient is in no acute distress but she is uncomfortable  in appearance.  She is alert and oriented.  HEENT:  Normocephalic, atraumatic.  Extraocular muscles intact.  NECK:  Supple.  No lymphadenopathy.  LUNGS:  Clear to auscultation bilaterally.  CARDIOVASCULAR:  Regular rate and rhythm, tachycardic.  ABDOMEN:  Flat, soft, tender with palpation but no rebound or rigidity.  Positive bowel sounds.  EXTREMITIES:  No clubbing, cyanosis or edema.   LABORATORY VALUES:  On November 07, 2007:  White blood cell count 14.4,  hemoglobin 13.1, MCV is 95.3, platelets 452.  Sodium 136, potassium 4.0,  chloride 98, CO2 25, glucose 95, BUN 15, creatinine 0.7,  total bilirubin  is 0.4, alk phos 65, AST 14, ALT 12, albumin is 4.1. TSH is 0.65.   IMPRESSION:  1. Severe pain ulcerative colitis.  2. Dehydration.  3. Abdominal pain.   From the endoscopic appearance she certainly has severe disease,  although clinically she does appear to be mildly improved with IV  hydration.  Her tachycardia is not as prominent but she is still  complaining of a significant amount of pain.  She does not have any  abdominal distention and there is no evidence of any acute abdomen.  No  imaging has been performed at this time.   The plan will be to treat the patient for a severe ulcerative colitis  flare.   PLAN:  1. Start IV Solu-Medrol 40 mg IV q.12h.  2. Cover with antibiotics with Flagyl 500 mg IV q.8h.  3. Ciprofloxacin 400 mg IV q.12h.  4. Asacol 400 mg four tablets p.o. t.i.d.  5. Repeat stools for C. difficile will be performed in the hospital.      Biopsies of her colon are  pending at this time.  6. Also, an acute abdominal series to evaluate for any evidence of      toxic megacolon will be performed.  A CT scan was preferable;      however, she is not able to tolerate any significant p.o. at this      time.  The CT scan may be performed within the next 1 or 2 days.  7. Additionally, the patient will be started on IV Dilaudid for pain      control.      Jordan Hawks Elnoria Howard, MD  Electronically Signed     PDH/MEDQ  D:  11/14/2007  T:  11/15/2007  Job:  [id number]

## 2011-04-13 NOTE — Op Note (Signed)
NAMEALEXIA, [name]            ACCOUNT NO.:  [id number]   MEDICAL RECORD NO.:  [id number]          PATIENT TYPE:  INP   LOCATION:  0098                         FACILITY:  [facility]   PHYSICIAN:  Angelia Mould. Derrell Lolling, M.D.DATE OF BIRTH:  [birth year]   DATE OF PROCEDURE:  04/12/2008  DATE OF DISCHARGE:                               OPERATIVE REPORT   PREOPERATIVE DIAGNOSIS:  Perforated viscus, ulcerative colitis.   POSTOPERATIVE DIAGNOSIS:  Perforated left colon with peritonitis,  chronic colitis.   OPERATION PERFORMED:  Exploratory laparotomy, subtotal colectomy, Brooke  ileostomy.   SURGEON:  Claud Kelp, M.D.   FIRST ASSISTANT:  Junious Silk, NP.   OPERATIVE INDICATIONS:  This is a 60 year old white female who has had  some type of colitis for many years.  For the past six months she has  been having more symptoms and more problems including abdominal pain,  weight loss, decreased appetite and bleeding requiring blood  transfusions.  She has also been on several courses of steroids over the  past 6 months.  She was hospitalized a month ago for blood transfusion.   For the past 4 days or so she has been having increasing abdominal pain  and distention.  She came for endoscopy today and was going to have an  upper endoscopy, but Dr. Melvia Heaps recognized that she was acutely  ill.  X-ray showed a massive pneumoperitoneum.  I was called to see her.  She was resuscitated and brought to the operating room.   OPERATIVE FINDINGS:  The patient had moderately severe colitis involving  the transverse colon and left colon.  The rectum looked fairly normal.  The cecum was packed with fairly hard stool, but did not look actively  inflamed.  The small bowel looked normal.  The left colon had basically  deteriorated, fallen apart in the mid descending portion with fairly  significant fecal contamination on the left side.  The stomach and  duodenum looked normal.  The liver and  gallbladder looked normal.  The  spleen was small and looked normal.  The left ureter was identified and  preserved and looked normal.   OPERATIVE TECHNIQUE:  Following the induction of general endotracheal  anesthesia, a Foley catheter was inserted.  The patient's abdomen was  prepped and draped in a sterile fashion.  Intravenous antibiotics were  given.  The patient was identified as to correct patient and correct  procedure.  The midline laparotomy incision was made.  The abdomen was  entered and explored with findings as described above.  We packed off  the left colon to limit the contamination of stool.  We thoroughly  explored the abdomen.  I elected to do a subtotal colectomy thinking  that she has been severely symptomatic and would need to have this done.   I mobilized the terminal ileum and right colon by dividing the lateral  peritoneal attachments.  I transected the terminal ileum with the GIA  stapling device about 2 inches proximal to the ileocecal valve.  The  ileocolic vessels were clamped, divided and ligated with 2-0 silk ties.  I doubly ligated these  because they were fairly significant in size.  I  took down most of the mesentery and omentum using the Ligasure device.  I did tie off the middle colic vessels and large sigmoid vessels with 2-  0 silk ties.  Mostly I used the Ligasure.  The omentum was  foreshortened, inflamed and adherent to the transverse colon and could  not be preserved.   I continued the right colon dissection being careful to take the right  colon up off of the duodenum and to identify and preserve the duodenum.  A couple of small bleeders were suture ligated with 2-0 silk suture  ligatures.  We divided the transverse colon mesentery.  We divided the  gastrocolic omentum.  We very carefully mobilized the splenic flexure  both from the descending colon retrograde and from the transverse colon  prograde taking small bites a being careful to avoid  injury to the  pancreas and the spleen.  We continued the dissection down the  descending colon including the sigmoid colon.  We transected the  proximal rectum with the GIA stapling device and removed the specimen.  The rectum looked normal.  The small bowel looked normal.   We then irrigated the abdomen and pelvis extensively with about 10  liters of saline.  We changed our instruments, our gloves and our gowns.  We further irrigated to make sure that we had irrigated out all of the  fecal contamination that was possible and at the end of the case we  really did not see any fecal staining at all, just little bit of  inflammatory exudate on the small bowel where it had stuck to the colon  and this was fairly limited.  We positioned the NG tube in the stomach.  We checked for bleeding and there was none.  We chose to bring the  ileostomy out in the right abdomen.  She had somewhat of a protuberant  abdomen, so we brought the ileostomy out actually above the level of the  umbilicus so that it would be easy to place an appliance temporarily.  We cut out a circular button of skin.  We incised the fascia in a  cruciate fashion and dilated this a little bit.  We then ran the small  bowel several times.  There was no other abnormality.  We positioned the  terminal ileum so that it was not twisted in any way and brought it out  through the ileostomy opening.  This was done without difficulty.  We  positioned this carefully.  Internally we sewed the ileum to the  posterior rectus sheath with four interrupted sutures of 3-0 silk.   We then returned the small bowel to its anatomic position.  We checked  for bleeding, irrigated one more time and found that everything was  clean.  We closed the midline fascia with a running #1 double-stranded  PDS and interrupted retention sutures of #5 Ethibond.  After placing all  the sutures, we tied the retention sutures down over bolsters of red  rubber  catheter that were cut at an appropriate length.  We packed the  skin open with a 4x4 gauze.  We placed an ileostomy appliance.  I found  that the ileostomy was pink, very healthy and bled easily.  I could  stick my little finger through it and through the fascia into the  abdominal cavity.  After all the bandages were placed, the patient was  taken to the recovery room in stable  condition.  Estimated blood loss  was about 150 mL to 200 mL.  Complications none.  Sponge, needle and  instrument counts were correct.   Dr. Shireen Quan placed a right internal jugular IV in the patient and a  chest x-ray will be obtained.      Angelia Mould. Derrell Lolling, M.D.  Electronically Signed     HMI/MEDQ  D:  04/12/2008  T:  04/12/2008  Job:  [id number]   cc:   Barbette Hair. [name], MD,FACG  [address]  [place]  [place] [address]

## 2011-04-13 NOTE — Consult Note (Signed)
[name], [name]            ACCOUNT NO.:  [id number]   MEDICAL RECORD NO.:  [id number]          PATIENT TYPE:  AMB   LOCATION:  ENDO                         [facility]ACILITY:  [facility]   PHYSICIAN:  Angelia Mould. Derrell Lolling, M.D.DATE OF BIRTH:  10-Jun-1951   DATE OF CONSULTATION:  04/12/2008  DATE OF DISCHARGE:                                 CONSULTATION   REASON FOR CONSULTATION:  Evaluate abdominal pain and pneumoperitoneum   PRESENT ILLNESS:  This is a 60 year old white female who has known  pancolitis.  She has ben hospitallized twice over the last 6 months for  anemia and pain related to her colitis, necessitating courses of  steroids. She has had problems recently with abdominal pain and for the  past 3 or 4 days has been somewhat more distended and the pain has been  somewhat worse.  She has continued to have bowel movements which are  small.  She has not really had any vomiting.  She came to the endoscopy  unit today for an upper endoscopy and Dr. [name] recognized that she was  acutely ill.  Abdominal x-rays were done which shows a fairly  significant pneumoperitoneum.  Dr. [name] called me.  He felt that she  most likely has perforated her colon.   Historically, the pain has been diffuse, not more in the upper abdomen  or the lower abdomen.   She is being prepped to be taken to the OR for exploratory laparotomy.   PAST HISTORY:  She has had cesarean section.  The chart says she has  mitral valve prolapse.  She has had a bilateral tubal ligation.  She has  had colitis since she was a teenager.  Her first colonoscopy was 10  years ago which time she said she had minimal colitis.  Her most  recent colonoscopy was about 6 months ago by Dr. Loreta Ave at which time she  was said to have severe colitis throughout the colon.  She has had  problems with bleeding and has required blood transfusion.  She was  hospitalized in April 2009 and had blood transfusion at that time.  She  was started on  prednisone on March 06, 2008, and has been on as much 20  mg a day but is down to 10 mg a day now.   CURRENT MEDICATIONS:  1. Prednisone 10 mg a day.  2. Vicodin.  3. Diflucan (recently).  4. Mebendazole (recently).   DRUG ALLERGIES:  None known.   SOCIAL HISTORY:  She is married.  Her husband is with her.  [place]  [place] farm.  She has four children.  Denies alcohol or tobacco.   FAMILY HISTORY:  Negative for gastrointestinal disease.  Positive for  coronary artery disease.   REVIEW OF SYSTEMS:  Fifteen-system review of systems is performed and is  noncontributory except as described above.   PHYSICAL EXAM:  A pleasant, thin, middle-aged white female in moderate  distress.  She is alert but anxious.  Vital signs are pending at this  time.  Heart rate is obviously about 110.  She is slightly pale.  EYES:  Sclerae clear.  Extraocular movements intact.  No exophthalmos.  EAR, NOSE, MOUTH AND THROAT:  Nose, lips, and oropharynx are without  gross lesions.  She does have some thrush, however.  NECK:  No adenopathy.  No thyromegaly.  No jugular venous distention,  nontender.  HEART:  Regular tachycardia.  No murmur or rub.  Radial and femoral  pulses are palpable.  LUNGS:  Clear to auscultation.  No chest wall tenderness.  ABDOMEN:  Somewhat distended.  Diffusely tender.  She seems perhaps a  little more tender on the left lower quadrant than anywhere else.  There  is no mass.  Liver and spleen are not enlarged.  Minimal bowel sounds.  EXTREMITIES:  She moves all four extremities well without pain or  deformity.  No edema.  NEUROLOGIC:  No gross motor sensory deficits.   DATA REVIEWED:  I reviewed her abdominal x-rays which show significant  pneumoperitoneum.  Lab work shows hemoglobin 10.7; white blood cell  count 23,100; platelet count 543,000.  Sodium 134, potassium 4.7, BUN  38, creatinine 0.53, alkaline phosphate 243, albumin 1.8, amylase 41,  lipase 13.   ASSESSMENT:  1.  Perforated viscus.  This is more likely the colon than a peptic      ulcer but could be either.  She will need immediate laparotomy.  2. History of ulcerative colitis, presumably involving the whole colon      but with a normal terminal ileum by colonoscopy.  3. Status post cesarean section.  4. History of mitral valve prolapse.  5. History of bilateral tubal ligation.  6. Recent steroids.  7. Recent thrush.   PLAN:  1. The patient will receive IV fluid resuscitation, IV antibiotics,      steroids IV, and be taken to the operating room.  2. I have told that we will need to explore her abdomen, possibly      repair an ulcer, possibly do a subtotal colectomy with ileostomy.      She understands all of these issues well.  At this time all of her      questions are answered.  She and her husband are in full agreement      with this plan.      Angelia Mould. Derrell Lolling, M.D.  Electronically Signed     HMI/MEDQ  D:  04/12/2008  T:  04/12/2008  Job:  [id number]

## 2011-04-13 NOTE — Discharge Summary (Signed)
[name], [name]            ACCOUNT NO.:  [id number]   MEDICAL RECORD NO.:  [id number]          PATIENT TYPE:  INP   LOCATION:  1438                         FACILITY:  [facility]   PHYSICIAN:  Hind I Elsaid, MD      DATE OF BIRTH:  [birth year]   DATE OF ADMISSION:  07/11/2008  DATE OF DISCHARGE:                               DISCHARGE SUMMARY   PRIMARY GASTROENTEROLOGIST:  Barbette Hair. [name], MD,FACG   SURGEON:  Angelia Mould. Derrell Lolling, M.D.   DISCHARGE DIAGNOSES:  1. Left sub-phrenic abscess.  2. Exudative/sympathetic left pleural effusion.  3. Leukocytosis secondary to #1.  4. Left ovarian vein thrombosis.  5. Protein calorie malnutrition.  6. Anemia.  7. Status post subtotal colectomy secondary to ulcerative colitis.      Has an ileostomy bag.  8. Increased ileostomy bag output, which is improved.  9. Electrolyte imbalance, resolved.  10.Skin rash, resolved.  11.Deconditioning.  12.Hypertension, resolved.   MEDICATIONS:  To be dictated on the date of discharge.   CONSULTATIONS:  1. Gastroenterology consulted.  2. Surgery consulted.  3. Infectious disease consultation.   PROCEDURES:  1. Abdominal x-ray revealed no acute cardiopulmonary process, no      intraperitoneal free air or evidence of bowel obstruction.  2. CT angiogram negative for pulmonary embolism.  3. CT abdomen and pelvis:  New multiloculated left subphrenic abscess      extending along the course of resected descending colon.      Percutaneous drainage would be problematic, given the interposed      pleural cavity.  CT of the pelvis revealed inflammatory change in      the pelvic cavity.  A complex 2.5 cm fluid collection adjacent to      the uterus, nonspecific, includes pelvic abscess, ovarian      abnormality, hydrosalpinx.  Low density thrombus within the left      internal iliac vein.  4. Percutaneous drainage of the left sub-phrenic abscess.  5. Ultrasound of the transvaginal complex area with poorly  defined      margin posterior to the uterine fundus.  Due to lack of __________      margin, accurate measurement not obtainable, and therefore direct      comparison with CT difficult to assess.  A 9 mm benign right      ovarian cyst.  6. CT abdomen with contrast:  Significant interval decompression of      sub-phrenic abscess, status post drain and catheter placement.      There is some fine residual undrained locule, but not significant      to drainable component.  7. Chest x-ray with left pleural effusion with compressive      atelectasis.  8. Decubitus x-ray with free-floating left pleural effusion.  9. Ultrasound-guided thoracentesis of the left pleural effusion.   HISTORY OF PRESENT ILLNESS:  This is a 60 year old female with a history  of subtotal colectomy secondary to ulcerative colitis with perforation  done by Dr. Derrell Lolling in May of 2009 who presented with increasing  diarrhea, weakness, loss of appetite, rectal bleeding and abdominal  pain.  The patient was found to have elevated leukocytosis of 21.3.  The  patient desired to be admitted to the hospital for evaluation.  1. Rectal bleeding which completely stopped without blood transfusion.      It was felt this could be secondary to proctitis, and no further      intervention was done, and it completely resolved from the first      day of admission.  2. Increased ileostomy output.  The patient was placed on Cipro and      Flagyl, and stool studies were negative.  A stool culture for ovum      and parasites was negative.  Recommendation was to proceed with CT      abdomen to evaluate for complication after surgery.  CT scan as      above, sub-phrenic abscess.  Surgery consult was done by Dr. Derrell Lolling      and interventional radiology.  The patient underwent percutaneous      drainage of the sub-phrenic abscess.  Infectious disease also      consulted.  It was recommended to broad spectrum her antibiotic to      Zosyn,  vancomycin and Flagyl.  The patient had an episode of      hypertension, tachycardia and febrile episode with elevated      leukocytosis.  The patient almost went into sepsis.  The patient      had a complete septic work-up including blood culture, urinalysis      and culture which were negative.  Infectious disease consultation      provided by Dr. Maurice March where he recommended to continue Zosyn,      vancomycin and Flagyl.  Sub-phrenic abscess cultures were      completely negative until today.  Discussed with Dr. Maurice March, and he      recommended to discontinue vancomycin and possibly  the patient can      be discharged with Avelox.  Because the patient has no evidence of      any microorganism of the sub-phrenic abscess, decided to proceed      with left thoracentesis, which she underwent on July 22, 2008.      The results did show exudative pleural effusion.  At this time, the      results of culture and sensitivity is pending.  Most probably, the      left pleural effusion is sympathetic and reactive from the sub-      phrenic abscess.  At this time, will await the culture of the left      thoracic area, and the patient will continue with Zosyn and Flagyl.      If there is not any microorganism grown, the patient can be      discharged with Avelox 400 mg p.o. for 10 days.  The patient's      leukocytes improved significantly.  The patient has no further      fever, and has no hypotension.  Also, we should mention that the      patient used to be on Levophed for 24 hours after she developed      significant hypotension where blood pressure dropped to 60/40.  At      this time, blood pressure is 90/50, and it was felt this could be      the patient's baseline, and the patient is asymptomatic at this      time.  3. Left ovarian vein thrombosis.  At this time, the  patient is on      heparin, and we will start Coumadin if from the gastroenterologist      it is clear cut that there are no  further procedures.  At this      time, as per final gastroenterology note, they will proceed with      EGD before discharge.  For that, Coumadin will be on hold.  4. High output from the ileostomy, which is significantly improved      today.  The patient has solid stool.  During hospitalization, the      patient significantly improved.  Plan for this patient to undergo      EGD if it is okay from the gastroenterologist and to resume      Coumadin.  The patient also planned to be discharged with TNA, and      need to provide home health for that.  The patient needs to have an      appointment with a [facility] primary care physician for follow up.      Hind Bosie Helper, MD  Electronically Signed     HIE/MEDQ  D:  07/23/2008  T:  07/23/2008  Job:  [id number]

## 2011-04-13 NOTE — H&P (Signed)
[name], [name]            ACCOUNT NO.:  [id number]   MEDICAL RECORD NO.:  [id number]          PATIENT TYPE:  INP   LOCATION:  0103                         FACILITY:  [facility]   PHYSICIAN:  Michelene Gardener, MD    DATE OF BIRTH:  1951/04/01   DATE OF ADMISSION:  07/11/2008  DATE OF DISCHARGE:                              HISTORY & PHYSICAL   PRIMARY PHYSICIAN:  [name] patient does not have a local physician, will be  admitted as unassigned.   GASTROENTEROLOGIST:  Barbette Hair. [name], MD, Clementeen Graham.   SURGEON:  Angelia Mould. Derrell Lolling, M.D.   CHIEF COMPLAINT:  Increasing diarrhea, weakness, loss of appetite and  rectal bleed.   HISTORY OF PRESENT ILLNESS:  This is a 60 year old Caucasian female,  past medical history of multiple problems, presented with [name] above-  mentioned complaint.  This patient was recently admitted in [name] period  between May 15 up to May 04, 2008.  At that time she was admitted with  flare of ulcerative colitis with a perforation of [name] descending colon,  peritonitis.  She underwent subtotal colectomy with a Brooke ileostomy.  She was discharged on May 02, 2008.  Following that, she is followed  with Dr. [name] and with Dr. Derrell Lolling as an outpatient.  Stated that she  has been having poor appetite since her discharge and she has been  losing weight, where she lost about 30 pounds since May.  She did not  have specific complications with her procedure, but that is still not  healed.  She was following with Dr. Derrell Lolling for that and [name] last time  she saw him was around 2 weeks ago.  For [name] last 3 days, [name] patient  has been feeling more weak.  She has been having increasing shortness of  breath.  She noted that she had a lot of diarrhea coming from her  ileostomy. Today she had rectal bleed.  She came to [name] ER, where she  was evaluated by [name] ER doctor and then Dr. [name] was called.  He  preferred to admit [name] patient under [name] hospitalist service and he will  follow as  consult.   PAST MEDICAL HISTORY:  Significant for:  1. Recent perforation of [name] descending colon with peritonitis.  2. Chronic ulcerative colitis.  3. History of mitral valve prolapse.  4. History of Lyme disease.  5. History of bilateral tubal ligation.  6. History of cesarean section.  7. Protein calorie malnutrition.  8. Chronic anemia.  9. History of respiratory failure, where [name] patient was intubated on      her last admission.   ALLERGIES:  NO KNOWN DRUG ALLERGIES.  [name] PATIENT INTOLERANT TO  MESALAMINE THAT CAUSED CRAMPING AND DIARRHEA.   CURRENT MEDICATIONS:  None   SOCIAL HISTORY:  [name] patient is married.  She has 4 children.  [place]  [place] her [place].  Denied smoking.  She drinks alcohol  occasionally.   FAMILY HISTORY:  Negative for GI problem.  Positive for coronary artery  disease.   REVIEW OF SYSTEMS:  CONSTITUTIONAL:  Positive for fatigability.  EYES:  No blurred vision.  ENT:  No tinnitus.  RESPIRATORY:  No cough, no  wheezes.  CARDIOVASCULAR:  No chest pain, no shortness of breath.  GI:  Positive for increasing diarrhea from her ileostomy bag with rectal  bleed and mild abdominal pain.  There is no vomiting.  GU:  No dysuria,  no hematuria.  ENDOCRINE:  No polyuria, no nocturia.  HEMATOLOGY:  No  bruits, no bleeding.  ID:  No rashes or lesions.  NEURO:  No numbness,  no tingling.  [name] rest of [name] systems were reviewed and they were  negative.   PHYSICAL EXAMINATION:  VITAL SIGNS: Temperature 97.7, blood pressure is  108/74.  Pulse 126, respiratory rate 20.  HEENT:  Her conjunctivae are pink.  Pupils are equal and reactive to  light.  There is no ptosis.  Hearing is intact.  There is no ear  discharge or infection.  There is no nose infection or bleeding.  Oral  mucosa is dry.  No pharyngeal erythema.  NECK:  Supple.  No JVD, no carotid bruit, no lymphadenopathy, no thyroid  enlargement or thyroid tenderness.  CARDIOVASCULAR:  S1 and S2  regular.  There are no murmurs, no gallops  and no thrills.  RESPIRATORY:  [name] patient is breathing between 16 to 18.  There are no  rales, no rhonchi and no  wheezes.  ABDOMEN:  Soft, not distended.  There is mild tenderness.  Ileostomy bag  has diarrhea.  LOWER EXTREMITIES:  No edema and no varicose veins.  SKIN:  No rash and no erythema.  NEURO:  Cranial nerves are intact from 2-12.  There are no motor or  sensory deficits.   LAB RESULTS:  WBC 21.3, hemoglobin 12.3, hematocrit 37.4, platelet count  is 767.  INR 1.1.  Sodium 137, potassium 4.5, chloride 100, bicarb 24,  glucose 120, BUN 7, creatinine 0.51.  Albumin 3.1.   Chest x-ray, no acute problem.  Abdominal x-ray, no evidence of  obstruction.   IMPRESSION:  1. Rectal bleed.  2. Diarrhea.  3. Leukocytosis.  4. Failure to thrive.  5. Elevated D-dimer to rule out pulmonary embolus  6. Reactive thrombocytosis.  7. Ulcerative colitis.   PLAN:  1. Rectal bleed.  This patient had slight rectal bleeding that has      been noticed today.  Hemoglobin has remained stable since last      admission.  ER attending spoke to Dr. [name] and he will see her      tomorrow.  Meanwhile, I will give her IV fluids.  I will repeat her      CBC in [name] morning.  2. Diarrhea.  [name] patient had elevated white count, so her diarrhea      might be infectious.  Will put her on a combination of Cipro and      Flagyl.  Will send for stool studies including C.  diff, stool      cultures and ova and parasite.  3. Leukocytosis.  As mentioned, her white count is 21.3.  That might      be secondary to infection plus or minus dehydration.  Will treat      that with antibiotics and IV fluids and will repeat her WBC      tomorrow.  4. Elevated D-dimer. [name] patient also had increasing shortness of      breath with tachycardia, a heart rate 126.  That was related to [name]      possibility of  a PE.  Will get CT angiography.  We will not put her      on  anticoagulation for      now because of her rectal bleed.  5. Reactive thrombocytosis.  No need for further workup for now.   DISPOSITION:  Otherwise, other medical condition seems to be stable.  Total assessment time is 40 minutes.      Michelene Gardener, MD  Electronically Signed     NAE/MEDQ  D:  07/11/2008  T:  07/12/2008  Job:  [id number]

## 2011-04-13 NOTE — Consult Note (Signed)
[name], [name]            ACCOUNT NO.:  [id number]   MEDICAL RECORD NO.:  [id number]          PATIENT TYPE:  INP   LOCATION:  1337                         FACILITY:  [facility]   PHYSICIAN:  Almond Lint, MD       DATE OF BIRTH:  08/15/1951   DATE OF CONSULTATION:  07/17/2008  DATE OF DISCHARGE:                                 CONSULTATION   CHIEF COMPLAINT:  Left upper quadrant abscess.   HISTORY OF PRESENT ILLNESS:  [name] is a 60 year old female referred  by [name] and [name] for evaluation of left upper quadrant  abscess.  [name] is an unfortunate 60 year old female who had a  diagnosis of ulcerative colitis for 22 years.  This had been remission  until this spring when she had free air and went to the operating room  for a subtotal colectomy with Brooke ileostomy.  She states that  postoperatively her wound has never 100% healed and she has only done  well if she has continued to be on pain medicine.  She did, however, get  to where she was eating reasonably normally and had semi-solid stools in  her ileostomy until last Tuesday, which was 8 days prior to admission.  At that point, she started having watery output and increased abdominal  pain.  She denied fevers and chills.  She did also have nausea and  vomiting in additional to developing rectal bleeding.  She came to the  emergency department and was admitted by [name] of the [facility].  She also complains of a feeling of significant weakness.   PAST MEDICAL HISTORY:  1. Chronic ulcerative colitis and perforation as described above.  2. Mitral valve prolapse.  3. History of Lyme disease.  4. Protein calorie malnutrition.  5. Chronic anemia.  6. History of respiratory failure requiring intubation on last      admission.   PAST SURGICAL HISTORY:  1. Subtotal colectomy and a Brooke ileostomy in April 2009.  2. Bilateral tubal ligation.  3. Cesarean section.   ALLERGIES:  NONE, HOWEVER,  THE PATIENT WAS INTOLERANT TO MESALAMINE WITH  ABDOMINAL CRAMPING.   CURRENT MEDICATIONS:  She is on IV Heparin at this time, insulin, Magic  Mouthwash, Canasa , p.r.n. Zofran, pantoprazole, p.r.n. Phenergan,  vancomycin.  Also on IV Dilaudid p.r.n.   SOCIAL HISTORY:  She has four children and lives on a farm with her  husband.  She does not smoke, but drinks alcohol occasionally.   FAMILY HISTORY:  Positive for coronary artery disease, otherwise  negative.   REVIEW OF SYSTEMS:  Positive per the HPI, otherwise negative.   PHYSICAL EXAMINATION:  VITAL SIGNS:  T-max is 99.1, pulse is 105, blood  pressure 113/64, respiratory rate 20 with 97% sats on room air.  GENERAL:  Alert and oriented x3 in no acute distress, however, she  appears very cachectic.  HEENT:  Normocephalic, atraumatic.  Pupils equal, round and reactive to  light.  Oropharynx is clear.  NECK:  Supple with no lymphadenopathy and no thyromegaly.  Trachea is  midline.  There is no supra  or infraclavicular adenopathy.  HEART:  Regular rate and rhythm, no murmurs.  LUNGS:  Clear to auscultation bilaterally.  ABDOMEN:  Soft, nontender, nondistended.  She does report pain in the  flank area and in the lateral aspect of her lower ribs.  EXTREMITIES:  Warm and well perfused with no pitting edema.   LABORATORY DATA:  White count yesterday was 17.9.  Her chemistries are  within normal limits with mild hyperglycemia.  CT scan is reviewed with  [name] of radiology.  This shows a left subphrenic multiloculated  abscess.  This is adjacent to the spleen, but involving the spleen.  There is also a questionable abscess versus ovarian cystic lesion in the  right pelvis.   ASSESSMENT:  [name] is a 60 year old female with a history of  ulcerative colitis and subtotal colectomy 4 months ago.  It was  discussed with radiology to attempt to place a percutaneous drain in  this left subphrenic collection.  Given that she is having no  symptoms  at present in her pelvis, we will not attempt to drain the pelvic  abnormality.  Should she have recurrent symptoms or not resolve her  leukocytosis, we could proceed with an endovaginal ultrasound to better  evaluate the pelvic abnormality.  In any event, she may need an  endovaginal ultrasound to evaluate the ovary to make sure this is not a  mass.  She should continue on broad-spectrum antibiotics.  It we are  able to sample this collection we can culture it and then perhaps taper  antibiotics.  Of note, also on the CT scan she continues to have a small  amount of thrombus in her left renal vein and in her left iliac vein.  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  Therefore, she will likely need to be anticoagulated for this.  We will  follow this patient.  She should be n.p.o. after midnight to receive a  percutaneous drain.  Also, I discussed with radiology that she may be on  heparin at that time and that they may need to hold the heparin.      Almond Lint, MD  Electronically Signed     [name]  D:  07/17/2008  T:  07/17/2008  Job:  [id number]   cc:   Christella Hartigan, MD   [name] Bosie Helper, MD

## 2011-04-13 NOTE — Assessment & Plan Note (Signed)
[facility]                         GASTROENTEROLOGY OFFICE NOTE   NAME:[name], [name] HUXFORD                   MRN:          [id number]  DATE:03/06/2008                            DOB:          1951-10-17    REFERRING PHYSICIAN:  [name]   PROBLEM:  Rectal bleeding, diarrhea and abdominal cramping.   HISTORY:  [name] is a 60 year old white female who is known to Dr.  Charna [name], who was hospitalized on Dr. Kenna [name] service in December 2008  at which time she was felt to have fulminant ulcerative colitis.  She  did undergo colonoscopy during that admission which showed a pancolitis  and a normal terminal ileum.  She had biopsies taken which are  consistent with a chronic active colitis.  Negative for cytomegalovirus  and dysplasia.  She was treated initially with intravenous steroids and  then discharged home on 40 mg of prednisone per day.  She had been tried  on Lialda, but apparently was intolerant to this with increased  abdominal pain and cramping.  She said she had also tried Asacol in the  past which she was not able to tolerate.  At this point, the patient  wishes to establish with [name] as she has not been satisfied with  her [name] at Dr. Kenna [name] office.  She says she was initially diagnosed  with ulcerative colitis in 1970, was treated with steroids and then went  into remission.  Had another flare in 1987 which required steroids  again, went into remission and then had a flare in 1997 at which time  she saw [name].  She eventually went for homeopathic treatments at  that time and says her symptoms resolved, and she did not have any  further problems until December 2008.  Since then, she had gradually  tapered off of the steroids sometime in February and has not been on any  medication over the past 1-1/2 months.  She says about 3 weeks ago, she  started having an increase in stooling again, now usually having 3-4  bowel  movements per day which are semi-liquid and with streaks of blood.  She is having abdominal cramping and discomfort.  No documented fever or  chills.  She says she does not have any appetite and seems to have  spells with nausea and then occasional vomiting.  She is quite concerned  because she says that she is fairly certain that she passed 2 worms in  January 2008.  [place] farm.  They raise chickens and have  several other animals, and feels certain that she has worms.  She had  workup done through [name] with stool cultures which show  a strongyloides antibody IgG at 2.03 which is questionable presence of  strongyloides.  Stool for Giardia was negative.   LABORATORY DATA:  Most recent labs February 2009:  WBC 11.6, hemoglobin  10, hematocrit 30.1, platelets 504.   CURRENT MEDICATIONS:  None.   ALLERGIES:  NONE, but very sensitive to multiple medications.   FAMILY HISTORY:  Negative for colon cancer, polyps or inflammatory bowel  disease.  Positive for heart disease.   SOCIAL HISTORY:  The patient is married.  She has 4 children.  She is a  nonsmoker.  Drinks alcohol socially.   PAST MEDICAL HISTORY:  1. C. section.  2. Mitral valve prolapse.  3. Ulcerative colitis.  4. Bilateral tubal ligation in 1991.   REVIEW OF SYSTEMS:  Positive for recent skin rash.  Occasional night  sweats.  Severe fatigue.  Anemia.  [name]:  As outlined above.  All other  review of systems negative.   PHYSICAL EXAMINATION:  GENERAL:  Well-developed, thin white female in no  acute distress.  She is somewhat pale.  VITAL SIGNS:  Height is 5 feet, weight is 118.8, blood pressure 110/64,  pulse 80.  HEENT:  Normocephalic, atraumatic.  EOMI.  PERRLA.  Sclerae anicteric.  NECK:  Supple.  CARDIOVASCULAR:  Regular rate and rhythm with S1 and S2.  There is no  murmur, rub or gallop.  PULMONARY:  Clear to A&P.  ABDOMEN:  Soft.  She is tender bilaterally in the lower quadrants.  There is  no guarding or rebound.  No palpable mass or  hepatosplenomegaly.  RECTAL:  Not done today.  EXTREMITIES:  No edema.  No evidence of rash currently.   IMPRESSION:  43. A 60 year old female with recent colonoscopy documenting pan-      ulcerative colitis with exacerbation x3 weeks.  2. Stool for strongyloides equivocal.  3. Intolerant to mesalamines.   PLAN:  1. Treat with course of mebendazole 100 mg b.i.d. x5 days, then start      prednisone 20 mg p.o. q.a.m.  2. Trial of Robinul Forte 2 mg p.o. b.i.d. p.r.n. for abdominal      cramping.  3. Phenergan 25 mg q.6 h. as needed for nausea.  4. Vicodin 5/500 one every 6 hours if needed for pain at patient's      request.  5. Check CBC with diff today and TPMT enzyme studies for initiation of      therapy.  6. Return office visit with Dr. Arlyce [name] in approximately 2 weeks and      would anticipate initiating at that time.      [name] Gip, PA-C  Electronically Signed      [name]. [name] Dice, MD,FACG  Electronically Signed   AE/MedQ  DD: 03/06/2008  DT: 03/06/2008  Job #: [id number]

## 2011-04-13 NOTE — H&P (Signed)
[name], LACAP            ACCOUNT NO.:  [id number]   MEDICAL RECORD NO.:  [id number]          PATIENT TYPE:  INP   LOCATION:  1518                         [facility]AC[facility]   PHYSICIAN:  Judie Petit T. Russella Dar, MD, FACGDATE OF BIRTH:  1951-09-21   DATE OF ADMISSION:  03/21/2008  DATE OF DISCHARGE:                              HISTORY & PHYSICAL   CHIEF COMPLAINT:  1. Nausea.  2. Abdominal pain.  3. Diarrhea.  4. Weakness.  5. Throat irritation.   HISTORY:  This is a 60 year old white female with known ulcerative  colitis.  She was recently seen as a new patient in the office on March 06, 2008, and for previous details see that note.  She has history of  pan ulcerative colitis.  She was felt to be having an exacerbation when  she was seen on March 06, 2008,  and was started on prednisone at 20 mg  per day.  She also was treated with a course of mebendazole for possible  helminth infection.  Inadvertently, she only took 10 mg of prednisone  per day, but eventually her diarrhea and rectal bleeding actually  improved somewhat.  She was seen in follow-up then by Dr. [name] on  March 19, 2008, and told to increase her prednisone to 20 mg per day.  She was also complaining of an increase in throat rawness and irritation  and stated she had a shredded wheat type feeling in the back of her  throat.  She said she had no appetite and was having nausea and vomiting  intermittently, progressive weakness and a 6-pound weight loss over the  past couple of weeks.  She was started on a course of Diflucan for the  throat irritation for possible Candida pharyngitis.  She calls back  today stating that she is no better, feels quite weak and is admitted to  the hospital for supportive management, IV steroids, etc.   CURRENT MEDICATIONS:  1. Vicodin 5/500  1 every 4-6 hours as needed.  2. Prednisone 20 mg p.o. daily.  3. Diflucan 100 daily for the past 2 days.  4. Phenergan p.r.n.   ALLERGIES:   SHE IS INTOLERANT TO MESALAMINE.   PAST HISTORY:  Prior C-section  mitral valve prolapse  prio bilateral tubal ligation in 1991.   FAMILY HISTORY:  Negative for GI disease.  Positive for coronary artery  disease.   SOCIAL HISTORY:  The patient is married.  She lives on a farm.  She has  four children.  No ETOH and no tobacco.   LABORATORY DATA:  Pending on admission.   PHYSICAL EXAMINATION:  GENERAL:  Well-developed, pale, ill-appearing  white female in no acute distress.  VITAL SIGNS:  Temperature is 97.8, blood pressure 93/57, pulse is 120,  sats 97 on room air.  HEENT:  Nontraumatic, normocephalic.  EOMI,  PERRLA.  Sclerae anicteric.  She is pale.  Oropharynx is clear.  There  is no definite thrush.  CARDIOVASCULAR:  Tachy regular rhythm with S1-S2.  No murmur, rub or  gallop.  PULMONARY:  Clear to A&P.  ABDOMEN:  Soft.  She  is tender in the right lower quadrant and left  lower quadrant.  There is no rebound.  No mass or hepatosplenomegaly.  Bowel sounds are active.  RECTAL:  Not done today.  EXTREMITIES:  Without clubbing, cyanosis or edema.  NEURO:  The patient is alert and oriented x3, and exam is grossly  nonfocal.   IMPRESSION:  57. 60 year old white female with pan ulcerative colitis, refractory to      outpatient management.  2. Nausea, anorexia and throat irritation, not clear whether this is      related to the colitis flare or possible Candida pharyngitis  3. Mitral valve prolapse.  4. Bilateral tubal ligation and previous cesarean section.  5. Anemia secondary to #1.   PLAN:  The patient is admitted to the service of Dr. Claudette Head for  IV fluid hydration, bowel rest, IV steroids.  Will continue a course of  p.o. Diflucan x10 days.  Check baseline labs.  For further details,  please see the orders.      [name] Esterwood, PA-C      Malcolm T. Russella Dar, MD, [facility]  Electronically Signed    AE/MEDQ  D:  04/09/2008  T:  04/09/2008  Job:  [id number]

## 2011-04-16 NOTE — Discharge Summary (Signed)
[name], [name]            ACCOUNT NO.:  [id number]   MEDICAL RECORD NO.:  [id number]          PATIENT TYPE:  INP   LOCATION:  1513                         FACILITY:  [facility]   PHYSICIAN:  Angelia Mould. Derrell Lolling, M.D.DATE OF BIRTH:  1951-06-12   DATE OF ADMISSION:  04/12/2008  DATE OF DISCHARGE:  05/02/2008                               DISCHARGE SUMMARY   FINAL DIAGNOSES:  1. Perforation of the descending colon with peritonitis.  2. Chronic ulcerative colitis with a recent flare up and 11-month      history of steroid dependence.  3. History of mitral valve prolapse.  4. History of being treated for Lyme disease.  5. Status post bilateral tubal ligation.  6. History of cesarean section.  7. Moderately severe protein calorie malnutrition secondary to      inflammatory bowel disease.  8. Chronic anemia.  9. Postop ventilator dependent respiratory failure due to adult      respiratory distress syndrome.  10.Thrush.   OPERATIONS PERFORMED:  Subtotal colectomy with Brooke ileostomy.   DAY OF SURGERY:  Apr 12, 2008.   HISTORY:  This is a 60 year old white female who has been known to have  ulcerative colitis for many years.  She has had 2 flare-ups most  recently the last 6 months.  She is been on steroids.  She has lost  weight and has not been doing well.  She has had a variety of  complaints.  She was brought to the endoscopy unit for an elective upper  endoscopy on the day of admission and was recognized as being acutely  ill.  Abdominal x-rays showed a significant pneumoperitoneum.  I was  asked to see the patient.  The patient was admitted, resuscitated and  taken to the operating room.   HOSPITAL COURSE:  On day of admission, the patient was given IV fluid  resuscitation, broad-spectrum antibiotics, stress dose steroids and  taken to the operating room.  She underwent laparotomy.  I found that  there was a large perforation of the descending colon.  The proximal  colon  was markedly diseased.  We transected the terminal ileum, resected  the right transverse colon and most of the descending colon and stapled  off in the sigmoid and performed a Brooke ileostomy. Pathology report  confirmed pan-colitis.   The patient required blood transfusion.  Postoperatively, she was  initially stable and was extubated and observed in the intensive care  unit.  Central lines were placed as she was started on hyperalimentation  and maintained on broad-spectrum antibiotics.  She had a mild delirium  but remained stable for the first few days.   Her respiratory status did deteriorate and she had to be reintubated on  May 20.  X-ray showed bilateral pulmonary edema and infiltrates.  Critical care medicine service was involved and assist in management of  her respiratory failure.  She was maintained on broad-spectrum  antibiotics and hyperalimentation during the period of ventilator  support.  Her abdominal wound remained open as intended and the  ileostomy remained healthy.  Cultures from her mouth grew Candida and  cultures from  the operating room grew E-coli and Enterobacter and  Pseudomonas.  She was placed on vancomycin, Primaxin and Diflucan.   Her pulmonary edema improved and she was extubated on May 25 and did  reasonably well from a pulmonary standpoint thereafter.   Over the next couple of weeks, she made slow but steady progress.  She  was markedly deconditioned.  She was seen by physical therapy,  occupational therapy and Dr. Lamar Benes of the rehabilitation service.  Initially it was thought that she would be a candidate for comprehensive  inpatient rehab but those plans did not materialize and ultimately she  had to be discharged with home health PT and home health OT.   She slowly progressed in her activities and ultimately we got her NG  tube out and started her on diet.  She was educated regarding her ostomy  care.  She was followed throughout her  hospital course by the [facility]  hospitalist service with several visits by Dr. Illene Regulus.   The patient progressed to where she was stable enough to be discharged  home on June 4.  At that time she was doing fairly well although still  quite deconditioned.  She her mental status was good.  She was  tolerating her diet.  Her midline wound was clean and open with  retention sutures in place and ileostomy stoma was working and healthy.  We tapered her steroids down somewhat.   The day of discharge, we took her PICC line out.  Arrangements were made  for home health nursing, home health occupational therapy and home  health physical therapy.   DISCHARGE MEDICATIONS:  Included:  1. Duragesic 25 mg patch, change every 3 days and stop in 12 days.  2. She was prednisone 7.5 mg a day a for 7 days, then 5 mg a day for 7      days , then 5 mg every other day for 10 days, then discontinue.  3. She was given prescription for OxyContin.  4. Multivitamins.  5. Valium p.r.n.   FOLLOWUP:  With me in the office in 1 week and Dr. Melvia Heaps in 2  weeks.      Angelia Mould. Derrell Lolling, M.D.  Electronically Signed     HMI/MEDQ  D:  05/26/2008  T:  05/26/2008  Job:  [id number]   cc:   Barbette Hair. [name], MD,FACG  [address]  [place]  [place] [address]   Rosalyn Gess. Norins, MD  [address]  [place]  [place] [address]   Ellwood Dense, M.D.  Fax: [phone]

## 2011-05-11 ENCOUNTER — Ambulatory Visit (INDEPENDENT_AMBULATORY_CARE_PROVIDER_SITE_OTHER): Payer: BC Managed Care – PPO | Admitting: Family Medicine

## 2011-05-11 VITALS — BP 106/72 | Temp 98.5°F | Wt 123.2 lb

## 2011-05-11 DIAGNOSIS — B372 Candidiasis of skin and nail: Secondary | ICD-10-CM

## 2011-05-11 MED ORDER — FLUCONAZOLE 100 MG PO TABS: 100.0000 mg | ORAL_TABLET | Freq: Every day | ORAL | Status: DC

## 2011-05-11 MED ORDER — NYSTATIN-TRIAMCINOLONE 100000-0.1 UNIT/GM-% EX OINT: TOPICAL_OINTMENT | Freq: Two times a day (BID) | CUTANEOUS | Status: AC

## 2011-05-11 NOTE — Progress Notes (Signed)
  Subjective:    Patient ID: [name], female    DOB: [birth year], 60 y.o.   MRN: [id number]  HPI Skin infxn- reports sxs have been present for 2 months.  Took Doxy in April for this and sxs started to clear 'but it's back big time'.  Infection is at ostomy stie.  Reports area will ooze and weep, then crust over.  intermittantly itchy and painful.  No fevers.  Denies rashes or infxn elsewhere on body.   Review of Systems For ROS see HPI     Objective:   Physical Exam  Constitutional: She appears well-developed and well-nourished. No distress.  Skin: Skin is warm and dry. Rash noted. There is erythema.       Erythematous rash at inferior edge of ostomy site on R lower abdomen w/ multiple satellite lesions- consistent w/ fungal infxn.          Assessment & Plan:

## 2011-05-11 NOTE — Assessment & Plan Note (Signed)
Pt skin rash is not staph but rather fungal.  Start oral antifungal and topical ointment.  Reviewed supportive care and red flags that should prompt return.  Pt expressed understanding and is in agreement w/ plan.

## 2011-05-11 NOTE — Patient Instructions (Signed)
This appears to be a fungal rash Take the Diflucan as directed- if no improvement in 10 days, please call the office Use the Mycolog ointment twice daily on the skin for symptom relief Call with any questions or concerns Hang in there!

## 2011-05-17 ENCOUNTER — Telehealth: Payer: Self-pay | Admitting: *Deleted

## 2011-05-17 MED ORDER — DOXYCYCLINE HYCLATE 100 MG PO CAPS: 100.0000 mg | ORAL_CAPSULE | Freq: Two times a day (BID) | ORAL | Status: AC

## 2011-05-17 NOTE — Telephone Encounter (Signed)
Discuss with patient, Rx sent to pharmacy. 

## 2011-05-17 NOTE — Telephone Encounter (Signed)
Can have Doxy 100mg  bid x10 days, #20, no refills.  If no better, will need f/u w/ Dr Laury Axon.

## 2011-07-19 ENCOUNTER — Ambulatory Visit (INDEPENDENT_AMBULATORY_CARE_PROVIDER_SITE_OTHER): Payer: BC Managed Care – PPO | Admitting: Family Medicine

## 2011-07-19 ENCOUNTER — Encounter: Payer: Self-pay | Admitting: Family Medicine

## 2011-07-19 VITALS — BP 102/66 | HR 72 | Temp 98.7°F | Wt 121.8 lb

## 2011-07-19 DIAGNOSIS — N390 Urinary tract infection, site not specified: Secondary | ICD-10-CM

## 2011-07-19 DIAGNOSIS — R21 Rash and other nonspecific skin eruption: Secondary | ICD-10-CM | POA: Insufficient documentation

## 2011-07-19 LAB — POCT URINALYSIS DIPSTICK
Bilirubin, UA: NEGATIVE
Nitrite, UA: NEGATIVE
pH, UA: 5

## 2011-07-19 MED ORDER — DOXYCYCLINE HYCLATE 100 MG PO TABS: 100.0000 mg | ORAL_TABLET | Freq: Two times a day (BID) | ORAL | Status: AC

## 2011-07-19 NOTE — Assessment & Plan Note (Signed)
?   MRSA----doxy for 2 weeks Derm if no better

## 2011-07-19 NOTE — Progress Notes (Signed)
  Subjective:    Patient ID: [name], female    DOB: 06-07-1951, 60 y.o.   MRN: [id number]  HPI Pt here  C/o urinary symptoms---urinary frequency and hesitancy.  No fever or back pain.   Pt also c/o rash breaking out again.  It did clear significantly with doxy but not 100%.  See last visit.   Review of Systems    as above Objective:   Physical Exam  Constitutional: She appears well-developed and well-nourished.  Skin: Skin is warm and dry. Rash noted. No abrasion noted. Rash is papular.  Psychiatric: She has a normal mood and affect. Her behavior is normal. Thought content normal.          Assessment & Plan:

## 2011-07-19 NOTE — Patient Instructions (Addendum)
Urinary Tract Infection (UTI)   Infections of the urinary tract can start in several places. A bladder infection (cystitis), a kidney infection (pyelonephritis), and a prostate infection (prostatitis) are different types of urinary tract infections. They usually get better if treated with medicines (antibiotics) that kill germs. Take all the medicine until it is gone. You or your child may feel better in a few days, but TAKE ALL MEDICINE or the infection may not respond and may become more difficult to treat.   HOME CARE INSTRUCTIONS   Drink enough water and fluids to keep the urine clear or pale yellow. Cranberry juice is especially recommended, in addition to large amounts of water.   Avoid caffeine, tea, and carbonated beverages. They tend to irritate the bladder.   Alcohol may irritate the prostate.   Only take over-the-counter or prescription medicines for pain, discomfort, or fever as directed by your caregiver.   FINDING OUT THE RESULTS OF YOUR TEST   Not all test results are available during your visit. If your or your child's test results are not back during the visit, make an appointment with your caregiver to find out the results. Do not assume everything is normal if you have not heard from your caregiver or the medical facility. It is important for you to follow up on all test results.   TO PREVENT FURTHER INFECTIONS:   Empty the bladder often. Avoid holding urine for long periods of time.   After a bowel movement, women should cleanse from front to back. Use each tissue only once.   Empty the bladder before and after sexual intercourse.   SEEK MEDICAL CARE IF:   There is back pain.   You or your child has an oral temperature above 100.4.   Your baby is older than 3 months with a rectal temperature of 100.5º F (38.1° C) or higher for more than 1 day.   Your or your child's problems (symptoms) are no better in 3 days. Return sooner if you or your child is getting worse.   SEEK IMMEDIATE MEDICAL CARE IF:    There is severe back pain or lower abdominal pain.   You or your child develops chills.   You or your child has an oral temperature above 100.4, not controlled by medicine.   Your baby is older than 3 months with a rectal temperature of 102º F (38.9º C) or higher.   Your baby is 3 months old or younger with a rectal temperature of 100.4º F (38º C) or higher.   There is nausea or vomiting.   There is continued burning or discomfort with urination.   MAKE SURE YOU:   Understand these instructions.   Will watch this condition.   Will get help right away if you or your child is not doing well or gets worse.   Document Released: 08/25/2005 Document Re-Released: 02/09/2010   [employer]® Patient Information ©2011 [employer].

## 2011-07-19 NOTE — Assessment & Plan Note (Signed)
Check culture Doxy

## 2011-08-24 LAB — COMPREHENSIVE METABOLIC PANEL
AST: 18
Albumin: 1.8 — ABNORMAL LOW
Calcium: 8.4
Chloride: 99
Creatinine, Ser: 0.75
GFR calc Af Amer: >60
Total Protein: 5.6 — ABNORMAL LOW

## 2011-08-24 LAB — CBC
HCT: 33.3 — ABNORMAL LOW
Hemoglobin: 11 — ABNORMAL LOW
Hemoglobin: 6.7 — CL
MCHC: 33
MCV: 84
MCV: 85.8
Platelets: 528 — ABNORMAL HIGH
Platelets: 929
RBC: 2.47 — ABNORMAL LOW
RDW: 16.8 — ABNORMAL HIGH
RDW: 18.5 — ABNORMAL HIGH
WBC: 14 — ABNORMAL HIGH

## 2011-08-24 LAB — CROSSMATCH
ABO/RH(D): A POS
Antibody Screen: NEGATIVE

## 2011-08-24 LAB — HEMOGLOBIN AND HEMATOCRIT, BLOOD
HCT: 29.1 — ABNORMAL LOW
Hemoglobin: 9.6 — ABNORMAL LOW

## 2011-08-24 LAB — STREP A DNA PROBE

## 2011-08-24 LAB — PATHOLOGIST SMEAR REVIEW

## 2011-08-24 LAB — PREALBUMIN: Prealbumin: 4.8 — ABNORMAL LOW

## 2011-08-24 LAB — DIFFERENTIAL
Basophils Relative: 0
Eosinophils Absolute: 0.1
Eosinophils Relative: 1
Lymphocytes Relative: 12
Lymphs Abs: 1.7
Monocytes Relative: 2 — ABNORMAL LOW
Neutro Abs: 11.9 — ABNORMAL HIGH

## 2011-08-24 LAB — SEDIMENTATION RATE: Sed Rate: 94 — ABNORMAL HIGH

## 2011-08-25 LAB — PREALBUMIN: Prealbumin: 2.9 — ABNORMAL LOW

## 2011-08-25 LAB — BLOOD GAS, ARTERIAL
Acid-Base Excess: 1.8
Acid-Base Excess: 5.2 — ABNORMAL HIGH
Acid-Base Excess: 8.9 — ABNORMAL HIGH
Bicarbonate: 27.9 — ABNORMAL HIGH
Bicarbonate: 31.2 — ABNORMAL HIGH
Bicarbonate: 31.9 — ABNORMAL HIGH
Bicarbonate: 34.9 — ABNORMAL HIGH
Drawn by: 229971
FIO2: 0.3
FIO2: 0.3
FIO2: 0.4
FIO2: 0.4
MECHVT: 300
MECHVT: 400
O2 Saturation: 97.3
O2 Saturation: 98.1
O2 Saturation: 98.3
PEEP: 5
PEEP: 5
Patient temperature: 98.6
RATE: 12
TCO2: 22.7
TCO2: 30
TCO2: 32.1
pCO2 arterial: 33.7 — ABNORMAL LOW
pCO2 arterial: 36.3
pCO2 arterial: 39.7
pCO2 arterial: 40.2
pH, Arterial: 7.535 — ABNORMAL HIGH
pH, Arterial: 7.553 — ABNORMAL HIGH
pH, Arterial: 7.564 — ABNORMAL HIGH
pO2, Arterial: 127 — ABNORMAL HIGH
pO2, Arterial: 148 — ABNORMAL HIGH
pO2, Arterial: 165 — ABNORMAL HIGH

## 2011-08-25 LAB — CROSSMATCH
ABO/RH(D): A POS
ABO/RH(D): A POS
Antibody Screen: NEGATIVE
Antibody Screen: NEGATIVE

## 2011-08-25 LAB — CBC
HCT: 21.6 — ABNORMAL LOW
HCT: 22.4 — ABNORMAL LOW
HCT: 25.1 — ABNORMAL LOW
HCT: 25.4 — ABNORMAL LOW
HCT: 25.6 — ABNORMAL LOW
HCT: 26.2 — ABNORMAL LOW
HCT: 27 — ABNORMAL LOW
HCT: 29.6 — ABNORMAL LOW
Hemoglobin: 10.1 — ABNORMAL LOW
Hemoglobin: 7.5 — CL
Hemoglobin: 8.4 — ABNORMAL LOW
Hemoglobin: 8.4 — ABNORMAL LOW
Hemoglobin: 8.6 — ABNORMAL LOW
Hemoglobin: 8.9 — ABNORMAL LOW
Hemoglobin: 9.6 — ABNORMAL LOW
Hemoglobin: 9.8 — ABNORMAL LOW
MCHC: 32.7
MCHC: 33
MCHC: 33.5
MCHC: 33.7
MCHC: 33.8
MCHC: 33.9
MCHC: 34.1
MCHC: 34.5
MCHC: 34.5
MCV: 84
MCV: 84.1
MCV: 84.4
MCV: 84.6
MCV: 85.2
Platelets: 140 — ABNORMAL LOW
Platelets: 196
Platelets: 201
Platelets: 433 — ABNORMAL HIGH
Platelets: 452 — ABNORMAL HIGH
Platelets: 543 — ABNORMAL HIGH
Platelets: 610 — ABNORMAL HIGH
RBC: 2.67 — ABNORMAL LOW
RBC: 2.91 — ABNORMAL LOW
RBC: 2.95 — ABNORMAL LOW
RBC: 2.97 — ABNORMAL LOW
RBC: 3.12 — ABNORMAL LOW
RBC: 3.35 — ABNORMAL LOW
RBC: 3.44 — ABNORMAL LOW
RBC: 4
RDW: 16.6 — ABNORMAL HIGH
RDW: 16.9 — ABNORMAL HIGH
RDW: 17.1 — ABNORMAL HIGH
RDW: 17.6 — ABNORMAL HIGH
RDW: 17.6 — ABNORMAL HIGH
RDW: 17.8 — ABNORMAL HIGH
RDW: 17.8 — ABNORMAL HIGH
RDW: 18.3 — ABNORMAL HIGH
WBC: 18.9 — ABNORMAL HIGH
WBC: 23.1 — ABNORMAL HIGH
WBC: 26.6 — ABNORMAL HIGH
WBC: 28 — ABNORMAL HIGH
WBC: 28 — ABNORMAL HIGH
WBC: 28.8 — ABNORMAL HIGH
WBC: 28.9 — ABNORMAL HIGH
WBC: 32.9 — ABNORMAL HIGH

## 2011-08-25 LAB — TRIGLYCERIDES
Triglycerides: 115
Triglycerides: 76

## 2011-08-25 LAB — COMPREHENSIVE METABOLIC PANEL
ALT: 15
ALT: 16
ALT: 23
AST: 13
AST: 13
AST: 14
AST: 14
AST: 20
Albumin: 1.8 — ABNORMAL LOW
Albumin: <1 — ABNORMAL LOW
Albumin: <1 — ABNORMAL LOW
Alkaline Phosphatase: 141 — ABNORMAL HIGH
Alkaline Phosphatase: 221 — ABNORMAL HIGH
Alkaline Phosphatase: 251 — ABNORMAL HIGH
BUN: 13
BUN: 17
BUN: 6
CO2: 20
CO2: 29
CO2: 33 — ABNORMAL HIGH
CO2: 35 — ABNORMAL HIGH
Calcium: 6.6 — ABNORMAL LOW
Calcium: 7.1 — ABNORMAL LOW
Calcium: 8.6
Chloride: 102
Chloride: 111
Chloride: 115 — ABNORMAL HIGH
Chloride: 97
Creatinine, Ser: 0.53
Creatinine, Ser: <0.3 — ABNORMAL LOW
GFR calc Af Amer: >60
GFR calc Af Amer: >60
GFR calc Af Amer: >60
GFR calc non Af Amer: >60
Glucose, Bld: 195 — ABNORMAL HIGH
Potassium: 2.6 — CL
Potassium: 2.8 — ABNORMAL LOW
Potassium: 3.6
Potassium: 4.2
Sodium: 134 — ABNORMAL LOW
Sodium: 134 — ABNORMAL LOW
Sodium: 144
Total Bilirubin: 0.5
Total Bilirubin: 0.6
Total Protein: 2.7 — ABNORMAL LOW
Total Protein: 3.8 — ABNORMAL LOW
Total Protein: <3 — ABNORMAL LOW

## 2011-08-25 LAB — FUNGUS CULTURE W SMEAR

## 2011-08-25 LAB — BASIC METABOLIC PANEL
BUN: 11
BUN: 13
BUN: 18
BUN: 20
BUN: 6
CO2: 24
CO2: 27
CO2: 30
CO2: 30
CO2: 31
CO2: 32
Calcium: 6.9 — ABNORMAL LOW
Calcium: 6.9 — ABNORMAL LOW
Calcium: 7 — ABNORMAL LOW
Calcium: 7.4 — ABNORMAL LOW
Calcium: 7.4 — ABNORMAL LOW
Calcium: 7.5 — ABNORMAL LOW
Chloride: 110
Chloride: 111
Chloride: 95 — ABNORMAL LOW
Chloride: 97
Chloride: 99
Creatinine, Ser: <0.3 — ABNORMAL LOW
Creatinine, Ser: <0.3 — ABNORMAL LOW
Creatinine, Ser: <0.3 — ABNORMAL LOW
GFR calc Af Amer: >60
GFR calc non Af Amer: >60
GFR calc non Af Amer: >60
GFR calc non Af Amer: >60
GFR calc non Af Amer: >60
Glucose, Bld: 125 — ABNORMAL HIGH
Glucose, Bld: 137 — ABNORMAL HIGH
Glucose, Bld: 195 — ABNORMAL HIGH
Glucose, Bld: 88
Potassium: 2.6 — CL
Potassium: 3.6
Potassium: 3.9
Potassium: 4
Potassium: 4
Potassium: 4.6
Sodium: 135
Sodium: 136
Sodium: 138
Sodium: 140
Sodium: 141
Sodium: 141
Sodium: 142

## 2011-08-25 LAB — CULTURE, BLOOD (ROUTINE X 2)

## 2011-08-25 LAB — ANAEROBIC CULTURE

## 2011-08-25 LAB — DIFFERENTIAL
Basophils Absolute: 0
Basophils Absolute: 0
Basophils Absolute: 0
Basophils Absolute: 0.6 — ABNORMAL HIGH
Basophils Relative: 0
Basophils Relative: 0
Basophils Relative: 0
Eosinophils Absolute: 0
Eosinophils Absolute: 0
Eosinophils Absolute: 0
Eosinophils Relative: 0
Eosinophils Relative: 0
Eosinophils Relative: 0
Lymphocytes Relative: 3 — ABNORMAL LOW
Lymphocytes Relative: 5 — ABNORMAL LOW
Lymphocytes Relative: 6 — ABNORMAL LOW
Lymphs Abs: 0.4 — ABNORMAL LOW
Lymphs Abs: 0.7
Lymphs Abs: 1.4
Lymphs Abs: 1.5
Monocytes Absolute: 0.3
Monocytes Absolute: 0.8
Monocytes Relative: 0 — ABNORMAL LOW
Monocytes Relative: 1 — ABNORMAL LOW
Monocytes Relative: 2 — ABNORMAL LOW
Neutro Abs: 18.5 — ABNORMAL HIGH
Neutro Abs: 21.2 — ABNORMAL HIGH
Neutro Abs: 25.8 — ABNORMAL HIGH
Neutro Abs: 27.3 — ABNORMAL HIGH
Neutro Abs: 31.9 — ABNORMAL HIGH
Neutrophils Relative %: 95 — ABNORMAL HIGH
Neutrophils Relative %: 97 — ABNORMAL HIGH

## 2011-08-25 LAB — CULTURE, BAL-QUANTITATIVE W GRAM STAIN: Colony Count: 1000

## 2011-08-25 LAB — TYPE AND SCREEN: Antibody Screen: NEGATIVE

## 2011-08-25 LAB — PHOSPHORUS
Phosphorus: 1.7 — ABNORMAL LOW
Phosphorus: 2 — ABNORMAL LOW
Phosphorus: 2.3
Phosphorus: 2.5
Phosphorus: 2.5
Phosphorus: 2.8
Phosphorus: 3.9

## 2011-08-25 LAB — CATH TIP CULTURE: Culture: NO GROWTH

## 2011-08-25 LAB — HEMOGLOBIN AND HEMATOCRIT, BLOOD
HCT: 19.9 — ABNORMAL LOW
HCT: 38.5
Hemoglobin: 12.8
Hemoglobin: 6.5 — CL

## 2011-08-25 LAB — MAGNESIUM
Magnesium: 1.5
Magnesium: 1.5

## 2011-08-25 LAB — APTT: aPTT: 41 — ABNORMAL HIGH

## 2011-08-25 LAB — CHOLESTEROL, TOTAL
Cholesterol: 52
Cholesterol: 82

## 2011-08-25 LAB — BODY FLUID CULTURE

## 2011-08-25 LAB — VANCOMYCIN, TROUGH: Vancomycin Tr: 9.1

## 2011-08-25 LAB — CARDIAC PANEL(CRET KIN+CKTOT+MB+TROPI)
Relative Index: INVALID
Troponin I: 0.57

## 2011-08-26 LAB — BASIC METABOLIC PANEL
CO2: 25
Calcium: 7.9 — ABNORMAL LOW
Creatinine, Ser: <0.3 — ABNORMAL LOW
Glucose, Bld: 96
Sodium: 135

## 2011-08-26 LAB — CBC
MCHC: 33.7
Platelets: 611 — ABNORMAL HIGH
RDW: 17.5 — ABNORMAL HIGH
RDW: 17.6 — ABNORMAL HIGH
WBC: 20.8 — ABNORMAL HIGH

## 2011-08-26 LAB — CULTURE, BLOOD (ROUTINE X 2): Culture: NO GROWTH

## 2011-08-26 LAB — URINE CULTURE
Colony Count: NO GROWTH
Culture: NO GROWTH
Special Requests: NEGATIVE

## 2011-08-27 LAB — DIFFERENTIAL
Basophils Absolute: 0.1
Basophils Relative: 0
Eosinophils Absolute: 0.1
Eosinophils Relative: 0
Lymphs Abs: 2.3

## 2011-08-27 LAB — COMPREHENSIVE METABOLIC PANEL
ALT: 14
AST: 26
Alkaline Phosphatase: 122 — ABNORMAL HIGH
CO2: 24
Chloride: 100
GFR calc Af Amer: >60
GFR calc non Af Amer: >60
Potassium: 4.5
Sodium: 137
Total Bilirubin: 1.3 — ABNORMAL HIGH

## 2011-08-27 LAB — CBC
Hemoglobin: 12.3
MCHC: 32.6
MCV: 90.7
RBC: 3.44 — ABNORMAL LOW
RBC: 4.15
WBC: 21.3 — ABNORMAL HIGH

## 2011-08-27 LAB — OCCULT BLOOD X 1 CARD TO LAB, STOOL: Fecal Occult Bld: POSITIVE

## 2011-08-27 LAB — OVA AND PARASITE EXAMINATION

## 2011-08-27 LAB — LIPASE, BLOOD: Lipase: 19

## 2011-08-27 LAB — BASIC METABOLIC PANEL
CO2: 24
Chloride: 107
Creatinine, Ser: 0.42
GFR calc Af Amer: >60

## 2011-08-27 LAB — CLOSTRIDIUM DIFFICILE EIA: C difficile Toxins A+B, EIA: NEGATIVE

## 2011-08-27 LAB — SAMPLE TO BLOOD BANK

## 2011-08-27 LAB — STOOL CULTURE

## 2011-08-27 LAB — MAGNESIUM: Magnesium: 1.9

## 2011-08-27 LAB — PREALBUMIN: Prealbumin: 7.8 — ABNORMAL LOW

## 2011-09-01 LAB — CBC
HCT: 27 — ABNORMAL LOW
HCT: 28 — ABNORMAL LOW
HCT: 29.2 — ABNORMAL LOW
Hemoglobin: 9.1 — ABNORMAL LOW
Hemoglobin: 9.3 — ABNORMAL LOW
Hemoglobin: 9.3 — ABNORMAL LOW
Hemoglobin: 9.7 — ABNORMAL LOW
MCHC: 33.3
Platelets: 702 — ABNORMAL HIGH
RBC: 2.92 — ABNORMAL LOW
RBC: 3.06 — ABNORMAL LOW
RDW: 21.6 — ABNORMAL HIGH
RDW: 22.2 — ABNORMAL HIGH
WBC: 10
WBC: 8
WBC: 8.8

## 2011-09-01 LAB — COMPREHENSIVE METABOLIC PANEL
ALT: 10
Albumin: 2.6 — ABNORMAL LOW
Alkaline Phosphatase: 45
Chloride: 109
Glucose, Bld: 105 — ABNORMAL HIGH
Potassium: 3.8
Sodium: 139
Total Protein: 5.6 — ABNORMAL LOW

## 2011-09-01 LAB — CLOSTRIDIUM DIFFICILE EIA
C difficile Toxins A+B, EIA: NEGATIVE
C difficile Toxins A+B, EIA: NEGATIVE

## 2011-09-01 LAB — GLUCOSE, CAPILLARY: Glucose-Capillary: 106 — ABNORMAL HIGH

## 2011-09-01 LAB — PROTIME-INR
INR: 1.5
INR: 2.3 — ABNORMAL HIGH
INR: 2.6 — ABNORMAL HIGH
INR: 2.7 — ABNORMAL HIGH

## 2011-09-01 LAB — BASIC METABOLIC PANEL
CO2: 23
GFR calc non Af Amer: >60
Glucose, Bld: 112 — ABNORMAL HIGH
Potassium: 4.1
Sodium: 139

## 2011-09-03 LAB — COMPREHENSIVE METABOLIC PANEL
ALT: 16
AST: 23
AST: 32
Albumin: 2.7 — ABNORMAL LOW
Alkaline Phosphatase: 45
Alkaline Phosphatase: 51
BUN: 6
BUN: 6
CO2: 26
CO2: 26
Calcium: 8.1 — ABNORMAL LOW
Chloride: 106
Creatinine, Ser: 0.53
GFR calc Af Amer: >60
GFR calc Af Amer: >60
GFR calc non Af Amer: >60
GFR calc non Af Amer: >60
Glucose, Bld: 107 — ABNORMAL HIGH
Potassium: 3.3 — ABNORMAL LOW
Potassium: 3.8
Sodium: 137
Sodium: 140
Total Bilirubin: 0.3
Total Bilirubin: 0.5
Total Protein: 5.1 — ABNORMAL LOW
Total Protein: 5.3 — ABNORMAL LOW

## 2011-09-03 LAB — CBC
HCT: 31.7 — ABNORMAL LOW
HCT: 34.5 — ABNORMAL LOW
Hemoglobin: 10 — ABNORMAL LOW
Hemoglobin: 10.7 — ABNORMAL LOW
Hemoglobin: 9.4 — ABNORMAL LOW
Hemoglobin: 9.4 — ABNORMAL LOW
MCHC: 33.6
MCHC: 33.7
MCHC: 33.8
MCHC: 33.8
MCHC: 34.5
MCV: 93.6
MCV: 93.9
MCV: 94.8
Platelets: 393
Platelets: 442 — ABNORMAL HIGH
Platelets: 571 — ABNORMAL HIGH
Platelets: 606 — ABNORMAL HIGH
RBC: 2.94 — ABNORMAL LOW
RBC: 2.95 — ABNORMAL LOW
RBC: 3.16 — ABNORMAL LOW
RBC: 3.34 — ABNORMAL LOW
RBC: 3.39 — ABNORMAL LOW
RBC: 3.49 — ABNORMAL LOW
RBC: 3.67 — ABNORMAL LOW
RDW: 13.3
WBC: 10.1
WBC: 12.6 — ABNORMAL HIGH
WBC: 18.5 — ABNORMAL HIGH

## 2011-09-03 LAB — BASIC METABOLIC PANEL
BUN: 10
BUN: 10
CO2: 21
CO2: 21
CO2: 22
CO2: 24
Calcium: 7.2 — ABNORMAL LOW
Calcium: 7.6 — ABNORMAL LOW
Calcium: 7.8 — ABNORMAL LOW
Calcium: 8 — ABNORMAL LOW
Calcium: 8.1 — ABNORMAL LOW
Chloride: 101
Chloride: 106
Creatinine, Ser: 0.47
Creatinine, Ser: 0.51
Creatinine, Ser: 0.51
Creatinine, Ser: 0.61
Creatinine, Ser: 0.72
GFR calc Af Amer: >60
GFR calc Af Amer: >60
GFR calc Af Amer: >60
GFR calc Af Amer: >60
GFR calc Af Amer: >60
GFR calc non Af Amer: >60
GFR calc non Af Amer: >60
GFR calc non Af Amer: >60
Glucose, Bld: 108 — ABNORMAL HIGH
Potassium: 4.1
Sodium: 139
Sodium: 139
Sodium: 140

## 2011-09-03 LAB — MAGNESIUM
Magnesium: 2.2
Magnesium: 2.2

## 2011-09-03 LAB — CHOLESTEROL, TOTAL: Cholesterol: 138

## 2011-09-03 LAB — GIARDIA/CRYPTOSPORIDIUM SCREEN(EIA): Cryptosporidium Screen (EIA): NEGATIVE

## 2011-09-03 LAB — CLOSTRIDIUM DIFFICILE EIA
C difficile Toxins A+B, EIA: NEGATIVE
C difficile Toxins A+B, EIA: NEGATIVE

## 2011-09-03 LAB — PREALBUMIN: Prealbumin: 18.8

## 2011-09-03 LAB — DIFFERENTIAL
Basophils Absolute: 0.1
Eosinophils Relative: 0
Lymphocytes Relative: 15
Lymphs Abs: 2.1
Neutro Abs: 10.6 — ABNORMAL HIGH

## 2011-09-03 LAB — PHOSPHORUS
Phosphorus: 2.3
Phosphorus: 2.5

## 2011-09-03 LAB — TRIGLYCERIDES: Triglycerides: 119

## 2011-09-03 LAB — LACTIC ACID, PLASMA: Lactic Acid, Venous: 1.2

## 2017-04-24 ENCOUNTER — Emergency Department (HOSPITAL_COMMUNITY)
Admission: EM | Admit: 2017-04-24 | Discharge: 2017-04-24 | Disposition: A | Discharge status: Home / Self Care | Payer: Medicare Other | Attending: Emergency Medicine | Admitting: Emergency Medicine

## 2017-04-24 ENCOUNTER — Encounter (HOSPITAL_COMMUNITY): Payer: Self-pay | Admitting: Emergency Medicine

## 2017-04-24 ENCOUNTER — Emergency Department (HOSPITAL_COMMUNITY): Payer: Medicare Other

## 2017-04-24 DIAGNOSIS — M7989 Other specified soft tissue disorders: Secondary | ICD-10-CM | POA: Diagnosis not present

## 2017-04-24 DIAGNOSIS — Z79899 Other long term (current) drug therapy: Secondary | ICD-10-CM | POA: Insufficient documentation

## 2017-04-24 DIAGNOSIS — M25571 Pain in right ankle and joints of right foot: Secondary | ICD-10-CM | POA: Insufficient documentation

## 2017-04-24 MED ORDER — ACETAMINOPHEN 500 MG PO TABS
1000.0000 mg | ORAL_TABLET | Freq: Once | ORAL | Status: AC
  Administered 2017-04-24: 1000 mg via ORAL
  Filled 2017-04-24: qty 2

## 2017-04-24 MED ORDER — OXYCODONE HCL 5 MG PO TABS
2.5000 mg | ORAL_TABLET | Freq: Once | ORAL | Status: AC
  Administered 2017-04-24: 2.5 mg via ORAL
  Filled 2017-04-24: qty 1

## 2017-04-24 MED ORDER — CEPHALEXIN 500 MG PO CAPS: 500.0000 mg | ORAL_CAPSULE | Freq: Four times a day (QID) | ORAL | 0 refills | Status: DC

## 2017-04-24 NOTE — ED Triage Notes (Signed)
Pt c/o R ankle swelling since last night. Pt states she took Aspirin, no relief. Pt states pain worsens when she attempts to walk, pain radiating to R foot and anterior RLE. Pt reports that she recently traveled and returned 3 days ago. Pt also c/o cough x 10 days.

## 2017-04-24 NOTE — ED Provider Notes (Signed)
[facility]-EMERGENCY DEPT Provider Note   CSN: [id number] Arrival date & time: 04/24/17  1055     History   Chief Complaint Chief Complaint  Patient presents with  . Ankle Pain    HPI [name] is a 66 y.o. female.  66 yo F with a chief complaints of right lateral ankle pain. Started last night. Recently returned from a airplane trip. Denies chest pain or shortness of breath. Denies other areas of leg swelling. Was having some pain with standing so came to the ED.   The history is provided by the patient.  Ankle Pain   The incident occurred yesterday. The incident occurred at home. There was no injury mechanism. The pain is present in the right ankle. The quality of the pain is described as aching and sharp. The pain is at a severity of 7/10. The pain is moderate. The pain has been constant since onset. Pertinent negatives include no loss of motion. She reports no foreign bodies present. Nothing aggravates the symptoms. She has tried nothing for the symptoms. The treatment provided no relief.    Past Medical History:  Diagnosis Date  . Anorexia   . GERD (gastroesophageal reflux disease)   . Ulcerative colitis     Patient Active Problem List   Diagnosis Date Noted  . Rash 07/19/2011  . OTITIS EXTERNA, ACUTE, RIGHT 07/06/2010  . SLEEP APNEA, OBSTRUCTIVE 07/03/2010  . ROM 07/03/2010  . BASILAR LUNG ATELECTASIS 10/07/2009  . LOW BACK PAIN, ACUTE 10/07/2009  . UTI 09/16/2009  . SHINGLES 05/02/2009  . STRONGYLOIDIASIS 04/24/2009  . CBC, ABNORMAL 04/24/2009  . FATIGUE 03/19/2009  . CONJUNCTIVITIS, ACUTE, BILATERAL 11/25/2008  . COUGH 11/25/2008  . HAIR LOSS 10/09/2008  . CONTACT DERMATITIS 09/09/2008  . LYME DISEASE, HX OF 09/09/2008  . DIARRHEA 08/21/2008  . CANDIDIASIS OF SKIN AND NAILS 08/13/2008  . GERD 04/05/2008  . UNIVERSAL ULCERATIVE COLITIS 04/05/2008    Past Surgical History:  Procedure Laterality Date  . CESAREAN SECTION    . ILEOSTOMY  5/09   Dr.Ingram --drained abscess and ileostomy    OB History    No data available       Home Medications    Prior to Admission medications   Medication Sig Start Date End Date Taking? Authorizing Provider  cephALEXin (KEFLEX) 500 MG capsule Take 1 capsule (500 mg total) by mouth 4 (four) times daily. 04/24/17   Melene Plan, DO    Family History Family History  Problem Relation Age of Onset  . Colitis Sister   . Crohn's disease Sister   . Stroke Father     Social History Social History  Substance Use Topics  . Smoking status: Never Smoker  . Smokeless tobacco: Never Used  . Alcohol use No     Allergies   Mesalamine   Review of Systems Review of Systems  Constitutional: Negative for chills and fever.  HENT: Negative for congestion and rhinorrhea.   Eyes: Negative for redness and visual disturbance.  Respiratory: Negative for shortness of breath and wheezing.   Cardiovascular: Negative for chest pain and palpitations.  Gastrointestinal: Negative for nausea and vomiting.  Genitourinary: Negative for dysuria and urgency.  Musculoskeletal: Positive for arthralgias and myalgias.  Skin: Negative for pallor and wound.  Neurological: Negative for dizziness and headaches.     Physical Exam Updated Vital Signs BP 125/65 (BP Location: Left Arm)   Pulse 98   Temp 99.1 F (37.3 C) (Oral)   Resp 16   Ht  5' (1.524 m)   Wt 58.7 kg (129 lb 6 oz)   SpO2 98%   BMI 25.27 kg/m   Physical Exam  Constitutional: She is oriented to person, place, and time. She appears well-developed and well-nourished. No distress.  HENT:  Head: Normocephalic and atraumatic.  Eyes: EOM are normal. Pupils are equal, round, and reactive to light.  Neck: Normal range of motion. Neck supple.  Cardiovascular: Normal rate and regular rhythm.  Exam reveals no gallop and no friction rub.   No murmur heard. Pulmonary/Chest: Effort normal. She has no wheezes. She has no rales.  Abdominal: Soft. She  exhibits no distension. There is no tenderness.  Musculoskeletal: She exhibits edema. She exhibits no tenderness.  Trace edema with localized erythema to the lateral malleolus. Full range of motion. Pulse motor and sensation intact distally. Mild warmth compared to the other side.  Neurological: She is alert and oriented to person, place, and time.  Skin: Skin is warm and dry. She is not diaphoretic.  Psychiatric: She has a normal mood and affect. Her behavior is normal.  Nursing note and vitals reviewed.    ED Treatments / Results  Labs (all labs ordered are listed, but only abnormal results are displayed) Labs Reviewed - No data to display  EKG  EKG Interpretation None       Radiology Dg Ankle Complete Right  Result Date: 04/24/2017 CLINICAL DATA:  Woke up with pain.  No reported trauma.  Swelling. EXAM: RIGHT ANKLE - COMPLETE 3+ VIEW COMPARISON:  None. FINDINGS: The ankle mortise is intact. No acute fractures are seen. Lucencies along the dome of the talus medially are consistent with osteochondral defects. IMPRESSION: 1. Mild soft tissue swelling.  No acute fracture. 2. Lucencies along the medial dome of the talus suggest osteochondral defects. Electronically Signed   By: Gerome Samavid  Williams III M.D   On: 04/24/2017 11:43    Procedures Procedures (including critical care time)  Medications Ordered in ED Medications  acetaminophen (TYLENOL) tablet 1,000 mg (not administered)  oxyCODONE (Oxy IR/ROXICODONE) immediate release tablet 2.5 mg (not administered)     Initial Impression / Assessment and Plan / ED Course  I have reviewed the triage vital signs and the nursing notes.  Pertinent labs & imaging results that were available during my care of the patient were reviewed by me and considered in my medical decision making (see chart for details).     66 yo F With a chief complaints of right lateral ankle pain. Going on for the past 12 hours or so. Family is concerned about a  blood clot as she was on a recent plane flight. On exam patient has localized erythema to the lateral malleolus. Mildly warm to the touch. Able to range the joint only pain localized to that area. I doubt that this is a septic joint. The family and I discussed the risks and benefits of arthrocentesis. Currently they're declining. Will start on Keflex. PCP follow-up.  1:59 PM:  I have discussed the diagnosis/risks/treatment options with the patient and family and believe the pt to be eligible for discharge home to follow-up with PCP. We also discussed returning to the ED immediately if new or worsening sx occur. We discussed the sx which are most concerning (e.g., sudden worsening pain, fever, rapidly spreading redness) that necessitate immediate return. Medications administered to the patient during their visit and any new prescriptions provided to the patient are listed below.  Medications given during this visit Medications  acetaminophen (TYLENOL)  tablet 1,000 mg (not administered)  oxyCODONE (Oxy IR/ROXICODONE) immediate release tablet 2.5 mg (not administered)     The patient appears reasonably screen and/or stabilized for discharge and I doubt any other medical condition or other [facility] requiring further screening, evaluation, or treatment in the ED at this time prior to discharge.    Final Clinical Impressions(s) / ED Diagnoses   Final diagnoses:  Acute right ankle pain    New Prescriptions New Prescriptions   CEPHALEXIN (KEFLEX) 500 MG CAPSULE    Take 1 capsule (500 mg total) by mouth 4 (four) times daily.     Melene Plan, DO 04/24/17 1359

## 2017-04-24 NOTE — Discharge Instructions (Signed)
Return for sudden spreading redness or inability to move the ankle. Follow-up with your family doctor in a few days. Take the antibiotics as prescribed.  Take 2 over the counter ibuprofen tablets 3 times a day or 1 over-the-counter naproxen tablets twice a day for pain. Also take tylenol 1000mg (2 extra strength) four times a day.

## 2017-04-24 NOTE — ED Notes (Signed)
Pt was recently on a flight and is concerned about a clot. Pt denies any fall or trauma to the extremity at this time.

## 2017-05-02 ENCOUNTER — Encounter: Payer: Self-pay | Admitting: Adult Health

## 2017-05-02 ENCOUNTER — Ambulatory Visit (INDEPENDENT_AMBULATORY_CARE_PROVIDER_SITE_OTHER): Payer: Medicare Other | Admitting: Adult Health

## 2017-05-02 VITALS — BP 97/63 | HR 76 | Ht 60.0 in | Wt 109.6 lb

## 2017-05-02 DIAGNOSIS — L659 Nonscarring hair loss, unspecified: Secondary | ICD-10-CM | POA: Diagnosis not present

## 2017-05-02 DIAGNOSIS — R7989 Other specified abnormal findings of blood chemistry: Secondary | ICD-10-CM

## 2017-05-02 DIAGNOSIS — R5383 Other fatigue: Secondary | ICD-10-CM

## 2017-05-02 DIAGNOSIS — Z131 Encounter for screening for diabetes mellitus: Secondary | ICD-10-CM | POA: Diagnosis not present

## 2017-05-02 DIAGNOSIS — Z933 Colostomy status: Secondary | ICD-10-CM | POA: Diagnosis not present

## 2017-05-02 DIAGNOSIS — Z932 Ileostomy status: Secondary | ICD-10-CM | POA: Diagnosis not present

## 2017-05-02 DIAGNOSIS — Z0000 Encounter for general adult medical examination without abnormal findings: Secondary | ICD-10-CM | POA: Insufficient documentation

## 2017-05-02 DIAGNOSIS — Z823 Family history of stroke: Secondary | ICD-10-CM

## 2017-05-02 MED ORDER — BETAMETHASONE VALERATE 0.1 % EX LOTN: 1.0000 "application " | TOPICAL_LOTION | Freq: Two times a day (BID) | CUTANEOUS | 2 refills | Status: DC

## 2017-05-02 NOTE — Progress Notes (Addendum)
Subjective:    Patient ID: [name], female    DOB: 1951/01/14, 66 y.o.   MRN: [id number]  HPI:  [name] presents to establish as a new pt. She is pleasant 66 year old female, who has been unhappy with previous providers and care that she has received in the past.  PMH: Intermittent contact dermatitis, hx of lyme disease, hx of strongyloidiasis, and extensive GI hx- please see EPIC notes.  She has intact ilesotomy (placed 04/12/2008), reports only needing to change the bad twice a week.  She cannot tolerate foods that heavily cooked/boiled.  She has not been to primary care > 3 years and last contact with GI was 2011.  She has low confidence in traditional healthcare and is weary of providers.   She does not take Rx medications on regular/routine basis and feels "pretty well despite what I have been through".  Last labs were 2011. She is married, retired and has 4 grown children.  Patient Care Team    Relationship Specialty Notifications Start End  Julaine Fusianford, Merve Hotard D, NP PCP - General Family Medicine  05/02/17     Patient Active Problem List   Diagnosis Date Noted  . Health care maintenance 05/02/2017  . Ileostomy status (HCC) 05/02/2017  . Rash 07/19/2011  . OTITIS EXTERNA, ACUTE, RIGHT 07/06/2010  . SLEEP APNEA, OBSTRUCTIVE 07/03/2010  . ROM 07/03/2010  . BASILAR LUNG ATELECTASIS 10/07/2009  . LOW BACK PAIN, ACUTE 10/07/2009  . UTI 09/16/2009  . SHINGLES 05/02/2009  . STRONGYLOIDIASIS 04/24/2009  . Abnormal CBC 04/24/2009  . Fatigue 03/19/2009  . CONJUNCTIVITIS, ACUTE, BILATERAL 11/25/2008  . COUGH 11/25/2008  . Alopecia 10/09/2008  . CONTACT DERMATITIS 09/09/2008  . LYME DISEASE, HX OF 09/09/2008  . DIARRHEA 08/21/2008  . CANDIDIASIS OF SKIN AND NAILS 08/13/2008  . GERD 04/05/2008  . UNIVERSAL ULCERATIVE COLITIS 04/05/2008     Past Medical History:  Diagnosis Date  . Anorexia   . Ulcerative colitis      Past Surgical History:  Procedure Laterality Date  .  CESAREAN SECTION    . COLECTOMY    . ILEOSTOMY  5/09   Dr.Ingram --drained abscess and ileostomy     Family History  Problem Relation Age of Onset  . Colitis Sister   . Crohn's disease Sister   . Stroke Father   . Hypertension Father   . Hyperlipidemia Father   . Stroke Brother   . Hypertension Brother   . Hyperlipidemia Brother   . Hypertension Paternal Grandmother   . Stroke Paternal Grandmother      History  Drug Use No     History  Alcohol Use No     History  Smoking Status  . Never Smoker  Smokeless Tobacco  . Never Used     Outpatient Encounter Prescriptions as of 05/02/2017  Medication Sig  . betamethasone valerate lotion (VALISONE) 0.1 % Apply 1 application topically 2 (two) times daily.  . [DISCONTINUED] cephALEXin (KEFLEX) 500 MG capsule Take 1 capsule (500 mg total) by mouth 4 (four) times daily.   No facility-administered encounter medications on file as of 05/02/2017.     Allergies: Mesalamine  Body mass index is 21.4 kg/m.  Blood pressure 97/63, pulse 76, height 5' (1.524 m), weight 109 lb 9.6 oz (49.7 kg). Typical BP for her is SBPs 90-110, DBP 60-80s   Review of Systems  Constitutional: Positive for fatigue. Negative for activity change, appetite change, chills, diaphoresis, fever and unexpected weight change.  Eyes:  Negative for visual disturbance.  Respiratory: Negative for cough, chest tightness, shortness of breath, wheezing and stridor.   Cardiovascular: Negative for chest pain, palpitations and leg swelling.  Gastrointestinal: Negative for abdominal distention, abdominal pain, blood in stool, constipation, diarrhea, nausea and vomiting.       Intact ostomy   Endocrine: Negative for cold intolerance, heat intolerance, polydipsia, polyphagia and polyuria.  Genitourinary: Negative for difficulty urinating and flank pain.  Musculoskeletal: Positive for arthralgias and myalgias. Negative for gait problem, neck pain and neck stiffness.    Skin: Positive for rash. Negative for color change, pallor and wound.       Intermittent contact dermatitis-usually flares during summer months.  Allergic/Immunologic: Negative for immunocompromised state.  Neurological: Negative for dizziness, tremors, weakness, light-headedness and headaches.  Hematological: Does not bruise/bleed easily.  Psychiatric/Behavioral: Negative for agitation, confusion and sleep disturbance. The patient is not nervous/anxious and is not hyperactive.   All other systems reviewed and are negative.      Objective:   Physical Exam  Constitutional: She is oriented to person, place, and time. She appears well-developed and well-nourished. No distress.  HENT:  Head: Normocephalic and atraumatic.  Right Ear: External ear normal.  Left Ear: External ear normal.  Eyes: Conjunctivae are normal. Pupils are equal, round, and reactive to light.  Neck: Normal range of motion. Neck supple.  Cardiovascular: Normal rate, regular rhythm, normal heart sounds and intact distal pulses.   No murmur heard. Pulmonary/Chest: Effort normal and breath sounds normal. No respiratory distress. She has no wheezes. She has no rales. She exhibits no tenderness.  Abdominal: Soft. She exhibits no distension. There is no tenderness. There is no rigidity and no CVA tenderness.    Intact colostomy.  Good seal, no drainage noted.  Reddened skin at bottom of bag notes, no open tissue seen. Well healed vertical surgical scar noted above/below umbilicus.  Lymphadenopathy:    She has no cervical adenopathy.  Neurological: She is alert and oriented to person, place, and time. Coordination normal.  Skin: Skin is warm and dry. No rash noted. She is not diaphoretic. No erythema. No pallor.  Psychiatric: She has a normal mood and affect. Her behavior is normal. Judgment and thought content normal.  Nursing note and vitals reviewed.         Assessment & Plan:   1. Alopecia   2. Abnormal CBC    3. Fatigue, unspecified type   4. Family history of stroke   5. Screening for diabetes mellitus   6. Colostomy status (HCC)   7. Ileostomy status (HCC)     Fatigue Will check TSH, Vit D levels  Health care maintenance Will obtain fasting labs this week. Needs CPE. Ostomy referral placed due to redness around bottom of colostomy bag. Increase water intake to at least 50 ounces daily. Continue regular movement.  Ileostomy status (HCC) Consult to wound/ostomy/continence care placed.    FOLLOW-UP:  Return in about 6 months (around 11/01/2017) for Regular Follow Up.

## 2017-05-02 NOTE — Assessment & Plan Note (Addendum)
Will obtain fasting labs this week. Needs CPE. Ostomy referral placed due to redness around bottom of colostomy bag. Increase water intake to at least 50 ounces daily. Continue regular movement.

## 2017-05-02 NOTE — Assessment & Plan Note (Signed)
Consult to wound/ostomy/continence care placed.

## 2017-05-02 NOTE — Patient Instructions (Signed)
Colostomy Home Guide, Adult A colostomy is a surgical procedure to make an opening (stoma) for stool (feces) to leave your body. This surgery is done when a medical condition prevents stool from leaving your body through the end of the large intestine (rectum). During the surgery, part of the large intestine (colon) is attached to the stoma that is made in the front of your abdomen. A bag (pouch) is fitted over the stoma. Stool and gas will collect in the bag. After having this surgery, you will need to empty and change your colostomy bag as needed. You will also need to care for the stoma. How do I care for my stoma? Your stoma should look pink, red, and moist, like the inside of your cheek. At first, the stoma may be swollen, but this swelling will go away within 6 weeks. To care for the stoma:  Keep the skin around the stoma clean and dry.  Use a clean, soft washcloth to gently wash the stoma and the skin around it. ? Use warm water and only use cleansers recommended by your health care provider. ? Rinse the stoma area with plain water. ? Dry the area well.  Use stoma powder or ointment on your skin only as told by your health care provider. Do not use any other powders, gels, wipes, or creams on your skin.  Change your colostomy bag if your skin becomes irritated. Irritation may indicate that the bag is leaking.  Check your stoma area every day for signs of infection. Check for: ? More redness, swelling, or pain. ? More fluid or blood. ? Pus or warmth.  Measure the stoma opening regularly and record the size. Watch for changes. Share this information with your health care provider.  How do I care for my colostomy bag? The bag that fits over the stoma can have either one or two pieces.  One-piece bag: The skin barrier and the bag are combined in a single unit.  Two-piece bag: The skin barrier and the bag are separate pieces that attach to each other. 1.   Empty your bag at bedtime  and whenever it is one-third to one-half full. Do not let more stool or gas build up. This could cause the bag to leak. Some colostomy bags have a built-in gas release valve. Change the bag every 3-4 days or as told by your health care provider. Also change the bag if it is leaking or separating from the skin or your skin looks irritated. How do I empty my colostomy bag? Before you leave the hospital, you will be taught how to empty your bag. Follow these basic steps: 1. Wash your hands with soap and water. 2. Sit far back on the toilet. 3. Put several pieces of toilet paper into the toilet water. This will prevent splashing as you empty the stool into the toilet. 4. Remove the clip or the velcro from the tail end of the bag. 5. Unroll the tail, then empty stool into the toilet. 6. Clean the tail with toilet paper. 1.  7. Reroll the tail, and close it with the clip or velcro. 8. Wash your hands again.  How do I change my colostomy bag? Before you leave the hospital, you will be taught how to change your bag. Always have colostomy supplies with you, and follow these basic steps: 1. Wash your hands with soap and water. Have paper towels or tissues near you to clean any discharge. 2. Use a template to pre-cut  the skin barrier. Smooth any rough edges. 3. If using a two-piece bag, attach the bag and the skin barrier to each other. Add the barrier ring, if you use one. 4. If your stools are watery, add a few cotton balls to the new bag to absorb the liquid. 5. Remove the old bag and skin barrier. Gently push the skin away from the barrier with your fingers or a warm cloth. 6. Wash your hands again. Then clean the stoma area as directed with water or with mild soap and water. Use water to rinse away any soap. 7. Dry the skin. You may use the cool setting on a hair dryer to do this. 8. If directed, apply stoma powder or skin barrier gel to the skin. 9. Dry the skin again. 10. Warm the skin barrier  with your hands or a warm compress. 11. Remove the paper from the sticky (adhesive) strip of the skin barrier. 12. Press the adhesive strip onto the skin around the stoma. 13. Gently rub the skin barrier onto the skin. This creates heat that helps the barrier to stick. 14. Apply stoma tape to the edges of the skin barrier.  What are some general tips?  Avoid wearing clothes that are tight directly over your stoma.  You may shower or bathe with the colostomy bag on or off. Do not use harsh or oily soaps or lotions. Dry the skin and bag after bathing.  Store all supplies in a cool, dry place. Do not leave supplies in extreme heat because parts can melt.  Whenever you leave home, take an extra skin barrier and colostomy bag with you.  If your colostomy bag gets wet, you can dry it with a hair dryer on the cool setting.  To prevent odor, put drops of ostomy deodorizer in the colostomy bag. Your health care provider may also recommend putting ostomy lubricant inside the bag. This helps the stool to slide out of the bag more easily and completely. Contact a health care provider if:  You have more redness, swelling, or pain around your stoma.  You have more fluid or blood coming from your stoma.  Your stoma feels warm to the touch.  You have pus coming from your stoma.  Your stoma extends in or out farther than normal.  You need to change the bag every day.  You have a fever. Get help right away if:  Your stool is bloody.  You vomit.  You have trouble breathing. This information is not intended to replace advice given to you by your health care provider. Make sure you discuss any questions you have with your health care provider. Document Released: 11/18/2003 Document Revised: 03/25/2016 Document Reviewed: 03/20/2014 Elsevier Interactive Patient Education  2018 [employer].  Referral for Ostomy RN placed. Increase water intake to at least 50 ounces daily. Continue regular  daily movement. Will obtain fasting labs this Wednesday. Follow-up in 6-12 months/sooner if needed.

## 2017-05-02 NOTE — Assessment & Plan Note (Signed)
Will check TSH, Vit D levels

## 2017-05-04 ENCOUNTER — Other Ambulatory Visit (INDEPENDENT_AMBULATORY_CARE_PROVIDER_SITE_OTHER): Payer: Medicare Other

## 2017-05-04 DIAGNOSIS — R5383 Other fatigue: Secondary | ICD-10-CM

## 2017-05-04 DIAGNOSIS — Z9889 Other specified postprocedural states: Secondary | ICD-10-CM | POA: Diagnosis not present

## 2017-05-04 DIAGNOSIS — Z823 Family history of stroke: Secondary | ICD-10-CM

## 2017-05-04 DIAGNOSIS — L659 Nonscarring hair loss, unspecified: Secondary | ICD-10-CM | POA: Diagnosis not present

## 2017-05-04 DIAGNOSIS — R946 Abnormal results of thyroid function studies: Secondary | ICD-10-CM | POA: Diagnosis not present

## 2017-05-04 DIAGNOSIS — R413 Other amnesia: Secondary | ICD-10-CM | POA: Diagnosis not present

## 2017-05-04 DIAGNOSIS — R7989 Other specified abnormal findings of blood chemistry: Secondary | ICD-10-CM

## 2017-05-04 DIAGNOSIS — E2839 Other primary ovarian failure: Secondary | ICD-10-CM | POA: Diagnosis not present

## 2017-05-04 DIAGNOSIS — Z131 Encounter for screening for diabetes mellitus: Secondary | ICD-10-CM

## 2017-05-04 DIAGNOSIS — R945 Abnormal results of liver function studies: Secondary | ICD-10-CM | POA: Diagnosis not present

## 2017-05-05 ENCOUNTER — Other Ambulatory Visit: Payer: Self-pay

## 2017-05-05 DIAGNOSIS — R7989 Other specified abnormal findings of blood chemistry: Secondary | ICD-10-CM

## 2017-05-05 LAB — CBC WITH DIFFERENTIAL/PLATELET
BASOS: 0 %
Basophils Absolute: 0 10*3/uL (ref 0.0–0.2)
EOS (ABSOLUTE): 0.3 10*3/uL (ref 0.0–0.4)
Eos: 4 %
Hematocrit: 39.8 % (ref 34.0–46.6)
Hemoglobin: 12.7 g/dL (ref 11.1–15.9)
IMMATURE GRANULOCYTES: 0 %
Immature Grans (Abs): 0 10*3/uL (ref 0.0–0.1)
LYMPHS ABS: 2.2 10*3/uL (ref 0.7–3.1)
Lymphs: 27 %
MCH: 29.4 pg (ref 26.6–33.0)
MCHC: 31.9 g/dL (ref 31.5–35.7)
MCV: 92 fL (ref 79–97)
MONOS ABS: 0.6 10*3/uL (ref 0.1–0.9)
Monocytes: 7 %
Neutrophils Absolute: 5.3 10*3/uL (ref 1.4–7.0)
Neutrophils: 62 %
PLATELETS: 505 10*3/uL — AB (ref 150–379)
RBC: 4.32 x10E6/uL (ref 3.77–5.28)
RDW: 12.9 % (ref 12.3–15.4)
WBC: 8.5 10*3/uL (ref 3.4–10.8)

## 2017-05-05 LAB — VITAMIN D 25 HYDROXY (VIT D DEFICIENCY, FRACTURES): Vit D, 25-Hydroxy: 49.9 ng/mL (ref 30.0–100.0)

## 2017-05-05 LAB — COMPREHENSIVE METABOLIC PANEL
A/G RATIO: 1.4 (ref 1.2–2.2)
ALK PHOS: 66 IU/L (ref 39–117)
ALT: 13 IU/L (ref 0–32)
AST: 15 IU/L (ref 0–40)
Albumin: 4.2 g/dL (ref 3.6–4.8)
BILIRUBIN TOTAL: 0.5 mg/dL (ref 0.0–1.2)
BUN/Creatinine Ratio: 21 (ref 12–28)
BUN: 15 mg/dL (ref 8–27)
CALCIUM: 9.4 mg/dL (ref 8.7–10.3)
CO2: 26 mmol/L (ref 18–29)
Chloride: 100 mmol/L (ref 96–106)
Creatinine, Ser: 0.71 mg/dL (ref 0.57–1.00)
GFR calc Af Amer: 103 mL/min/{1.73_m2} (ref >59)
GFR calc non Af Amer: 90 mL/min/{1.73_m2} (ref >59)
GLOBULIN, TOTAL: 3 g/dL (ref 1.5–4.5)
Glucose: 87 mg/dL (ref 65–99)
POTASSIUM: 5.3 mmol/L — AB (ref 3.5–5.2)
SODIUM: 140 mmol/L (ref 134–144)
Total Protein: 7.2 g/dL (ref 6.0–8.5)

## 2017-05-05 LAB — TSH: TSH: 0.124 u[IU]/mL — AB (ref 0.450–4.500)

## 2017-05-05 LAB — LIPID PANEL
CHOLESTEROL TOTAL: 198 mg/dL (ref 100–199)
Chol/HDL Ratio: 3.1 ratio (ref 0.0–4.4)
HDL: 63 mg/dL (ref >39)
LDL Calculated: 118 mg/dL — ABNORMAL HIGH (ref 0–99)
Triglycerides: 87 mg/dL (ref 0–149)
VLDL Cholesterol Cal: 17 mg/dL (ref 5–40)

## 2017-05-05 LAB — VITAMIN B12: VITAMIN B 12: 1716 pg/mL — AB (ref 232–1245)

## 2017-05-05 LAB — HEMOGLOBIN A1C
ESTIMATED AVERAGE GLUCOSE: 117 mg/dL
HEMOGLOBIN A1C: 5.7 % — AB (ref 4.8–5.6)

## 2017-05-11 LAB — T4, FREE: FREE T4: 1.38 ng/dL (ref 0.82–1.77)

## 2017-05-11 LAB — SPECIMEN STATUS REPORT

## 2017-05-27 ENCOUNTER — Telehealth: Payer: Self-pay

## 2017-05-27 NOTE — Telephone Encounter (Signed)
Spoke with patient regarding cellulitis issue.  Highly recommended patient go to urgent care and not wait until Monday for evaluation.  Patient refused to go to urgent care.  Patient has history of problems with this and being hospitalized.  I urged patient to go to urgent care but she declined and will keep appointment for Monday.

## 2017-05-30 ENCOUNTER — Ambulatory Visit (INDEPENDENT_AMBULATORY_CARE_PROVIDER_SITE_OTHER): Payer: Medicare Other | Admitting: Adult Health

## 2017-05-30 ENCOUNTER — Encounter: Payer: Self-pay | Admitting: Adult Health

## 2017-05-30 VITALS — BP 102/60 | HR 67 | Temp 98.5°F | Ht 60.0 in | Wt 115.0 lb

## 2017-05-30 DIAGNOSIS — Z932 Ileostomy status: Secondary | ICD-10-CM | POA: Diagnosis not present

## 2017-05-30 DIAGNOSIS — L231 Allergic contact dermatitis due to adhesives: Secondary | ICD-10-CM

## 2017-05-30 NOTE — Assessment & Plan Note (Signed)
No active signs of infection or open tissue. Home Health RN for ostomy care placed. If sx's worsen/persist advised to call clinic.

## 2017-05-30 NOTE — Assessment & Plan Note (Signed)
Discussed at length the benefits of f/u with GI. Her last contact with GI was 2011 and she vehemently refuses referral to specialist. We will continue to discuss that at every OV.

## 2017-05-30 NOTE — Progress Notes (Signed)
Subjective:    Patient ID: [name], female    DOB: Mar 20, 1951, 66 y.o.   MRN: [id number]  HPI:  [name] is here for "cellultis of my abdomen".  She had recent cellulitis of R ankle May 2018-treated successfully with Keflex.  She reports last Friday developing areas of redness on her forehead and around ostomy bag.  She denies recent travel outside of [place], recent camping/prolonged outside exposure, or change in diet.  She reports that sx's self resolved < 24 hrs.  She denies fever/night sweats/CP/palpitations/abdominal pain.  She denies blood in urine or in ostomy bag.  She reports increased frequency of changing the bag, due to redness and "skin being hot".  She has not seen since [facility] and vehemently refuses referral.  She is open to home health ostomy RN care.  Patient Care Team    Relationship Specialty Notifications Start End  Julaine Fusi, NP PCP - General Family Medicine  05/02/17     Patient Active Problem List   Diagnosis Date Noted  . Health care maintenance 05/02/2017  . Ileostomy status (HCC) 05/02/2017  . Rash 07/19/2011  . OTITIS EXTERNA, ACUTE, RIGHT 07/06/2010  . SLEEP APNEA, OBSTRUCTIVE 07/03/2010  . ROM 07/03/2010  . BASILAR LUNG ATELECTASIS 10/07/2009  . LOW BACK PAIN, ACUTE 10/07/2009  . UTI 09/16/2009  . SHINGLES 05/02/2009  . STRONGYLOIDIASIS 04/24/2009  . Abnormal CBC 04/24/2009  . Fatigue 03/19/2009  . CONJUNCTIVITIS, ACUTE, BILATERAL 11/25/2008  . COUGH 11/25/2008  . Alopecia 10/09/2008  . CONTACT DERMATITIS 09/09/2008  . LYME DISEASE, HX OF 09/09/2008  . DIARRHEA 08/21/2008  . CANDIDIASIS OF SKIN AND NAILS 08/13/2008  . GERD 04/05/2008  . UNIVERSAL ULCERATIVE COLITIS 04/05/2008     Past Medical History:  Diagnosis Date  . Anorexia   . Ulcerative colitis      Past Surgical History:  Procedure Laterality Date  . CESAREAN SECTION    . COLECTOMY    . ILEOSTOMY  5/09   Dr.Ingram --drained abscess and ileostomy      Family History  Problem Relation Age of Onset  . Colitis Sister   . Crohn's disease Sister   . Stroke Father   . Hypertension Father   . Hyperlipidemia Father   . Stroke Brother   . Hypertension Brother   . Hyperlipidemia Brother   . Hypertension Paternal Grandmother   . Stroke Paternal Grandmother      History  Drug Use No     History  Alcohol Use No     History  Smoking Status  . Never Smoker  Smokeless Tobacco  . Never Used     Outpatient Encounter Prescriptions as of 05/30/2017  Medication Sig  . betamethasone valerate lotion (VALISONE) 0.1 % Apply 1 application topically 2 (two) times daily.   No facility-administered encounter medications on file as of 05/30/2017.     Allergies: Mesalamine  Body mass index is 22.46 kg/m.  Blood pressure 102/60, pulse 67, temperature 98.5 F (36.9 C), temperature source Oral, height 5' (1.524 m), weight 115 lb (52.2 kg).     Review of Systems  Constitutional: Negative for activity change, appetite change, chills, diaphoresis, fatigue, fever and unexpected weight change.  Respiratory: Negative for cough, chest tightness, shortness of breath, wheezing and stridor.   Cardiovascular: Negative for chest pain, palpitations and leg swelling.  Gastrointestinal: Negative for abdominal distention, abdominal pain, blood in stool, constipation, diarrhea, nausea and vomiting.  Endocrine: Negative for cold intolerance, heat intolerance,  polydipsia, polyphagia and polyuria.  Genitourinary: Negative for difficulty urinating, flank pain and hematuria.  Skin: Positive for rash. Negative for color change, pallor and wound.  Allergic/Immunologic: Negative for immunocompromised state.  Neurological: Negative for headaches.  Hematological: Does not bruise/bleed easily.       Objective:   Physical Exam  Constitutional: She appears well-developed and well-nourished. No distress.  HENT:  Head: Normocephalic and atraumatic.   Eyes: Conjunctivae are normal. Pupils are equal, round, and reactive to light.  Cardiovascular: Normal rate, regular rhythm, normal heart sounds and intact distal pulses.   No murmur heard. Pulmonary/Chest: Effort normal and breath sounds normal. No respiratory distress. She has no wheezes. She has no rales. She exhibits no tenderness.  Abdominal: Soft. Bowel sounds are normal. She exhibits no distension and no mass. There is no hepatosplenomegaly. There is no tenderness. There is no rigidity, no rebound, no guarding, no CVA tenderness, no tenderness at McBurney's point and negative Murphy's sign.    Area of reddened, intact skin that is underneath adhesive portion of ostomy.   No open tissue/drainage/streaking/excessive heat noted.   Skin: Skin is warm and dry. Rash noted. She is not diaphoretic. No erythema. No pallor.  Forehead/face-completely clear of any redness/rash. Please see abdominal notes on rash around ostomy.  Psychiatric: She has a normal mood and affect. Her behavior is normal. Judgment and thought content normal.  Nursing note and vitals reviewed.         Assessment & Plan:   1. Contact dermatitis due to adhesives, unspecified contact dermatitis type   2. Ileostomy status (HCC)     CONTACT DERMATITIS No active signs of infection or open tissue. Home Health RN for ostomy care placed. If sx's worsen/persist advised to call clinic.  Ileostomy status (HCC) Discussed at length the benefits of f/u with GI. Her last contact with GI was 2011 and she vehemently refuses referral to specialist. We will continue to discuss that at every OV.     FOLLOW-UP:  Return if symptoms worsen or fail to improve.

## 2017-05-30 NOTE — Patient Instructions (Addendum)
Ileostomy Home Guide An ileostomy is a surgical procedure to make an opening (stoma) for stool to leave your body. The surgery is done when a medical condition prevents stool from passing through the intestines and leaving your body through the rectum. During the surgery, a part of the small intestine (ileum) is attached to the stoma made in the abdominal wall. A bag or pouch is fitted over the stoma. Stool and gas will collect in the pouch. After having this surgery, you will need to empty and change your ileostomy pouch as needed. You will also need to take steps to care for the stoma. How do I care for my stoma? Your stoma should look pink and moist. At first, the stoma may be swollen, but this swelling will go away within 6 weeks. To care for the stoma:  Keep the skin around the stoma clean and dry.  Use a clean, soft washcloth to gently wash the stoma and the skin around it.  Use stoma powder or ointment on your skin only as told by your health care provider.  If your skin becomes irritated, change your ileostomy pouch. The irritation may indicate that the pouch is leaking.  Measure the stoma opening regularly and record the size. Watch for changes. Share the information with your health care provider.  Check your stoma area every day for signs of infection. Check for: ? More redness, swelling, or pain. ? More fluid or blood. ? Pus or warmth.  How do I care for my ileostomy pouch? The pouch that fits over the stoma can have either one or two pieces.  One-piece pouch: The skin barrier and the pouch are combined in one unit.  Two-piece pouch: The skin barrier and the pouch are separate pieces that attach to each other.  Empty your pouch when it is one-third to one-half full. Do not let more stool or gas build up. This could cause the pouch to leak. Some pouches have a built-in gas release valve. Change your pouch every 3-4 days for the first 6 weeks, and then every 5-7 days. You  should also change the pouch right away if your skin near the stoma looks irritated. How do I empty my ileostomy pouch? You will be taught how to empty your pouch before you leave the hospital. Basic steps include: 1. Wash your hands with soap and water. 2. Sit far back on the toilet. 3. Put pieces of toilet paper into the toilet water. This will prevent splashing as you empty the stool into the toilet bowl. 4. Unclip the tail end of the pouch or separate the hook-and-loop fastener at the end of the pouch. 5. Unroll the tail and empty stool into the toilet. 6. Clean the tail with toilet paper. 7. Reroll the tail, and clip it closed or press the hook-and-loop fastener pieces together. 8. Wash your hands again.  How do I change my ileostomy pouch? You will be taught how to change the pouch before you leave the hospital. Basic steps include: 1. Lay out your supplies. 2. Wash your hands with soap and water. 3. Carefully remove the old pouch. 4. Wash the stoma and the skin around the stoma. Allow the area to dry. Men may be told to carefully shave any hair around the stoma. 5. Use the stoma measuring guide that comes with your pouch set to decide what size hole you will need to cut in the skin barrier piece. Choose the smallest possible size that will hold the  stoma but will not touch it. 6. Use the guide to trace the circle on the back of the skin barrier piece. 7. Cut out the hole. 8. Hold the skin barrier piece over the stoma to make sure the hole is the correct size. 9. Remove the adhesive paper backing from the skin barrier piece. 10. Squeeze stoma paste around the opening of the skin barrier piece. 11. Clean and dry the skin around the stoma again. 12. Carefully fit the skin barrier piece over your stoma. 13. If you are using a two-piece pouch, snap the pouch onto the skin barrier piece. 14. Close the tail of the pouch. 15. Put your hand over the top of the skin barrier piece to help  warm it for about 5 minutes. This helps it conform to your body better. 16. Wash your hands again.  What are some general tips?  Avoid wearing clothes that are tight directly over your stoma.  You can shower with or without the pouch in place. Do not use harsh or oily soaps or lotions. Always keep the pouch on if you are taking a bath or swimming.  If your pouch gets wet, you can dry it with a hair dryer on the cool setting.  Whenever you leave home, take an extra skin barrier and pouch with you.  Store all supplies in a cool, dry place. Do not leave supplies in extreme heat because parts can melt.  To prevent odor, you can put drops of ostomy deodorizer in the pouch. Your health care provider may also recommend putting ostomy lubricant inside the pouch. This helps the stool to slide out of the pouch more easily and completely.  Return to your normal activities as told by your health care provider. Ask your health care provider what activities are safe for you. Contact a health care provider if:  You have more redness, swelling, or pain around your stoma.  You have more fluid or blood coming from your stoma.  Your stoma feels warm to the touch.  You have pus coming from your stoma.  Your stoma extends in or out farther than normal.  Your stoma becomes purple, black, or pale white.  You need to change the pouch every day.  You have a fever.  You have abdominal pain, nausea, or bloating.  No stool is passing from the stoma.  You have diarrhea, requiring you to empty the pouch more frequently than normal. Get help right away if:  Your stool is bloody.  You vomit.  You have trouble breathing.  You feel dizzy or light-headed. This information is not intended to replace advice given to you by your health care provider. Make sure you discuss any questions you have with your health care provider. Document Released: 11/18/2003 Document Revised: 12/12/2015 Document Reviewed:  07/29/2015 Elsevier Interactive Patient Education  2017 [employer]  Ostomy Nurse orders placed. If you re-consider GI referral, please call clinic and we will place referral. Please call clinic with any questions/concerns.

## 2017-06-03 DIAGNOSIS — L309 Dermatitis, unspecified: Secondary | ICD-10-CM | POA: Diagnosis not present

## 2017-10-30 NOTE — Progress Notes (Signed)
Subjective:    Patient ID: [name], female    DOB: 02-25-1951, 66 y.o.   MRN: [id number]  HPI: 05/02/17 OV:  [name] presents to establish as a new pt. She is pleasant 66 year old female, who has been unhappy with previous providers and care that she has received in the past.  PMH: Intermittent contact dermatitis, hx of lyme disease, hx of strongyloidiasis, and extensive GI hx- please see EPIC notes.  She has intact ilesotomy (placed 04/12/2008), reports only needing to change the bad twice a week.  She cannot tolerate foods that heavily cooked/boiled.  She has not been to primary care > 3 years and last contact with GI was 2011.  She has low confidence in traditional healthcare and is weary of providers.   She does not take Rx medications on regular/routine basis and feels "pretty well despite what I have been through".  Last labs were 2011. She is married, retired and has 4 grown children.  11/01/17 OV: [name] is here for regular f/u:  Appt cxl'd  Patient Care Team    Relationship Specialty Notifications Start End  Julaine Fusianford, Katy D, NP PCP - General Family Medicine  05/02/17     Patient Active Problem List   Diagnosis Date Noted  . Health care maintenance 05/02/2017  . Ileostomy status (HCC) 05/02/2017  . Rash 07/19/2011  . OTITIS EXTERNA, ACUTE, RIGHT 07/06/2010  . SLEEP APNEA, OBSTRUCTIVE 07/03/2010  . ROM 07/03/2010  . BASILAR LUNG ATELECTASIS 10/07/2009  . LOW BACK PAIN, ACUTE 10/07/2009  . UTI 09/16/2009  . SHINGLES 05/02/2009  . STRONGYLOIDIASIS 04/24/2009  . Abnormal CBC 04/24/2009  . Fatigue 03/19/2009  . CONJUNCTIVITIS, ACUTE, BILATERAL 11/25/2008  . COUGH 11/25/2008  . Alopecia 10/09/2008  . CONTACT DERMATITIS 09/09/2008  . LYME DISEASE, HX OF 09/09/2008  . DIARRHEA 08/21/2008  . CANDIDIASIS OF SKIN AND NAILS 08/13/2008  . GERD 04/05/2008  . UNIVERSAL ULCERATIVE COLITIS 04/05/2008     Past Medical History:  Diagnosis Date  . Anorexia   .  Ulcerative colitis      Past Surgical History:  Procedure Laterality Date  . CESAREAN SECTION    . COLECTOMY    . ILEOSTOMY  5/09   Dr.Ingram --drained abscess and ileostomy     Family History  Problem Relation Age of Onset  . Colitis Sister   . Crohn's disease Sister   . Stroke Father   . Hypertension Father   . Hyperlipidemia Father   . Stroke Brother   . Hypertension Brother   . Hyperlipidemia Brother   . Hypertension Paternal Grandmother   . Stroke Paternal Grandmother      Social History   Substance and Sexual Activity  Drug Use No     Social History   Substance and Sexual Activity  Alcohol Use No     Social History   Tobacco Use  Smoking Status Never Smoker  Smokeless Tobacco Never Used     Outpatient Encounter Medications as of 11/01/2017  Medication Sig  . betamethasone valerate lotion (VALISONE) 0.1 % Apply 1 application topically 2 (two) times daily.   No facility-administered encounter medications on file as of 11/01/2017.     Allergies: Mesalamine  There is no height or weight on file to calculate BMI.  There were no vitals taken for this visit. Typical BP for her is SBPs 90-110, DBP 60-80s   Review of Systems  Constitutional: Positive for fatigue. Negative for activity change, appetite change, chills, diaphoresis,  fever and unexpected weight change.  Eyes: Negative for visual disturbance.  Respiratory: Negative for cough, chest tightness, shortness of breath, wheezing and stridor.   Cardiovascular: Negative for chest pain, palpitations and leg swelling.  Gastrointestinal: Negative for abdominal distention, abdominal pain, blood in stool, constipation, diarrhea, nausea and vomiting.       Intact ostomy   Endocrine: Negative for cold intolerance, heat intolerance, polydipsia, polyphagia and polyuria.  Genitourinary: Negative for difficulty urinating and flank pain.  Musculoskeletal: Positive for arthralgias and myalgias. Negative for  gait problem, neck pain and neck stiffness.  Skin: Positive for rash. Negative for color change, pallor and wound.       Intermittent contact dermatitis-usually flares during summer months.  Allergic/Immunologic: Negative for immunocompromised state.  Neurological: Negative for dizziness, tremors, weakness, light-headedness and headaches.  Hematological: Does not bruise/bleed easily.  Psychiatric/Behavioral: Negative for agitation, confusion and sleep disturbance. The patient is not nervous/anxious and is not hyperactive.   All other systems reviewed and are negative.      Objective:   Physical Exam  Constitutional: She is oriented to person, place, and time. She appears well-developed and well-nourished. No distress.  HENT:  Head: Normocephalic and atraumatic.  Right Ear: External ear normal.  Left Ear: External ear normal.  Eyes: Conjunctivae are normal. Pupils are equal, round, and reactive to light.  Neck: Normal range of motion. Neck supple.  Cardiovascular: Normal rate, regular rhythm, normal heart sounds and intact distal pulses.  No murmur heard. Pulmonary/Chest: Effort normal and breath sounds normal. No respiratory distress. She has no wheezes. She has no rales. She exhibits no tenderness.  Abdominal: Soft. She exhibits no distension. There is no tenderness. There is no rigidity and no CVA tenderness.    Intact colostomy.  Good seal, no drainage noted.  Reddened skin at bottom of bag notes, no open tissue seen. Well healed vertical surgical scar noted above/below umbilicus.  Lymphadenopathy:    She has no cervical adenopathy.  Neurological: She is alert and oriented to person, place, and time. Coordination normal.  Skin: Skin is warm and dry. No rash noted. She is not diaphoretic. No erythema. No pallor.  Psychiatric: She has a normal mood and affect. Her behavior is normal. Judgment and thought content normal.  Nursing note and vitals reviewed.         Assessment &  Plan:   No diagnosis found.  No problem-specific Assessment & Plan notes found for this encounter.    FOLLOW-UP:  No Follow-up on file.

## 2017-11-01 ENCOUNTER — Encounter: Payer: Self-pay | Admitting: Adult Health

## 2017-11-01 ENCOUNTER — Ambulatory Visit (INDEPENDENT_AMBULATORY_CARE_PROVIDER_SITE_OTHER): Payer: Medicare Other | Admitting: Adult Health

## 2017-11-01 VITALS — BP 97/59 | HR 71 | Ht 60.0 in | Wt 114.8 lb

## 2017-11-01 DIAGNOSIS — Z0000 Encounter for general adult medical examination without abnormal findings: Secondary | ICD-10-CM

## 2017-11-14 NOTE — Patient Instructions (Signed)
Appt cxled

## 2018-05-22 ENCOUNTER — Other Ambulatory Visit: Payer: Self-pay | Admitting: Adult Health

## 2018-05-22 MED ORDER — BETAMETHASONE VALERATE 0.1 % EX LOTN: 1.0000 "application " | TOPICAL_LOTION | Freq: Two times a day (BID) | CUTANEOUS | 2 refills | Status: DC

## 2018-05-22 NOTE — Telephone Encounter (Signed)
Patient called for Rx refill on :  betamethasone valerate lotion (VALISONE) 0.1 % [[id number]][[id number]]   Order Details  Dose: 1 application Route: Topical Frequency: 2 times daily  Dispense Quantity: 60 mL Refills: 2 Fills remaining: --        Sig: Apply 1 application topically 2 (two) times daily.      [facility]  [facility]      [facility] [address] [place]- [place], [place] - [id number] [phone] (Phone) [phone] (Fax)   --- Forwarding request to medical assistant.  --Fausto Skillernglh

## 2019-09-26 ENCOUNTER — Telehealth: Payer: Self-pay | Admitting: Adult Health

## 2019-09-26 NOTE — Telephone Encounter (Signed)
Patient is requesting a refill of her topical lotion. She has Rx benefits through [facility] but cannot add this coverage in the system since it is non-medical prescription coverage. If you need that Rx ID number it is [id number]. I have updated her pharm to [employer] per the patient's request moving forward.

## 2019-09-26 NOTE — Telephone Encounter (Signed)
We have not seen pt since 11/01/17.  Pt will need f/u prior to any refills.  Please call pt to schedule.  Charyl Bigger, CMA

## 2019-09-26 NOTE — Telephone Encounter (Signed)
In office, please.  

## 2019-09-30 NOTE — Progress Notes (Signed)
Virtual Visit via Telephone Note  I connected with [name] on 10/01/2019 at  3:15 PM EST by telephone and verified that I am speaking with the correct person using two identifiers.  Location: Patient: Home Provider: In Clinic   I discussed the limitations, risks, security and privacy concerns of performing an evaluation and management service by telephone and the availability of in person appointments. I also discussed with the patient that there may be a patient responsible charge related to this service. The patient expressed understanding and agreed to proceed.   History of Present Illness:   05/02/17 OV:  [name] presents to establish as a new pt. She is pleasant 68 year old female, who has been unhappy with previous providers and care that she has received in the past.  PMH: Intermittent contact dermatitis, hx of lyme disease, hx of strongyloidiasis, and extensive GI hx- please see EPIC notes.  She has intact ilesotomy (placed 04/12/2008), reports only needing to change the bad twice a week.  She cannot tolerate foods that heavily cooked/boiled.  She has not been to primary care > 3 years and last contact with GI was 2011.  She has low confidence in traditional healthcare and is weary of providers.   She does not take Rx medications on regular/routine basis and feels "pretty well despite what I have been through".  Last labs were 2011. She is married, retired and has 4 grown children. 10/01/2019 OV:  Ms. [name] calls in for follow-up and requests rx refill. She has not followed-up since acute visit 05/2017. Reviewed last lab from 04/2017- CBC essentially normal, slightly elevated Plt count 505, however it has been elevated in the past (> 400s the last few years).  A1c 5.7- pre-diabetic, just increased regular movement.  Lipid panel looks good, LDL slightly elevated 118 TSH a little low at 0.124 (and she has been running on lower side > 8 years), however Free 4 normal 1.3 Vit d  normal  B 12 - 1,716, please tell her to reduce B12 supplementation.  Recommended to repeat A1c, Lipids 6 months- she did not follow-up Discussed that she needs CPE with fasting labs annually. Also strongly encouraged annual f/u with Gastroenterologist- she said "I will think about seeing one in [place]"- declined referral today. She remains active with her grandchildren. She drinks water all day, tries to follow heart healthy diet.    Patient Care Team    Relationship Specialty Notifications Start End  Esaw Grandchild, NP PCP - General Family Medicine  05/02/17     Patient Active Problem List   Diagnosis Date Noted  . Health care maintenance 05/02/2017  . Ileostomy status ([place]) 05/02/2017  . Rash 07/19/2011  . OTITIS EXTERNA, ACUTE, RIGHT 07/06/2010  . SLEEP APNEA, OBSTRUCTIVE 07/03/2010  . ROM 07/03/2010  . BASILAR LUNG ATELECTASIS 10/07/2009  . LOW BACK PAIN, ACUTE 10/07/2009  . UTI 09/16/2009  . SHINGLES 05/02/2009  . STRONGYLOIDIASIS 04/24/2009  . Abnormal CBC 04/24/2009  . Fatigue 03/19/2009  . CONJUNCTIVITIS, ACUTE, BILATERAL 11/25/2008  . COUGH 11/25/2008  . Alopecia 10/09/2008  . CONTACT DERMATITIS 09/09/2008  . LYME DISEASE, HX OF 09/09/2008  . DIARRHEA 08/21/2008  . CANDIDIASIS OF SKIN AND NAILS 08/13/2008  . GERD 04/05/2008  . UNIVERSAL ULCERATIVE COLITIS 04/05/2008     Past Medical History:  Diagnosis Date  . Anorexia   . Ulcerative colitis      Past Surgical History:  Procedure Laterality Date  . CESAREAN SECTION    .  COLECTOMY    . ILEOSTOMY  5/09   Dr.Ingram --drained abscess and ileostomy     Family History  Problem Relation Age of Onset  . Colitis Sister   . Crohn's disease Sister   . Stroke Father   . Hypertension Father   . Hyperlipidemia Father   . Stroke Brother   . Hypertension Brother   . Hyperlipidemia Brother   . Hypertension Paternal Grandmother   . Stroke Paternal Grandmother      Social History   Substance and  Sexual Activity  Drug Use No     Social History   Substance and Sexual Activity  Alcohol Use No     Social History   Tobacco Use  Smoking Status Never Smoker  Smokeless Tobacco Never Used     Outpatient Encounter Medications as of 10/01/2019  Medication Sig  . betamethasone valerate lotion (VALISONE) 0.1 % Apply 1 application topically 2 (two) times daily.  . [DISCONTINUED] betamethasone valerate lotion (VALISONE) 0.1 % Apply 1 application topically 2 (two) times daily.   No facility-administered encounter medications on file as of 10/01/2019.     Allergies: Mesalamine  Body mass index is 21.48 kg/m.  Blood pressure 109/67, pulse 72, weight 110 lb (49.9 kg). Review of Systems: General:   Denies fever, chills, unexplained weight loss.  Optho/Auditory:   Denies visual changes, blurred vision/LOV Respiratory:   Denies SOB, DOE more than baseline levels.  Cardiovascular:   Denies chest pain, palpitations, new onset peripheral edema  Gastrointestinal:   Denies nausea, vomiting, diarrhea.  Genitourinary: Denies dysuria, freq/ urgency, flank pain or discharge from genitals.  Endocrine:     Denies hot or cold intolerance, polyuria, polydipsia. Musculoskeletal:   Denies unexplained myalgias, joint swelling, unexplained arthralgias, gait problems.  Skin:  Denies rash, suspicious lesions Neurological:     Denies dizziness, unexplained weakness, numbness  Psychiatric/Behavioral:   Denies mood changes, suicidal or homicidal ideations, hallucinations  Observations/Objective: No acute distress noted during the telephone conversation.  Assessment and Plan: Remain well hydrated, follow heart healthy diet.  Recommend annual follow-up with GI- she declined referral today. Discussed that she needs at least annual CPE with fasting labs. Reviewed last set of labs 04/2017 LDL- 118 Lab Results  Component Value Date   HGBA1C 5.7 (H) 05/04/2017  B-12 - elevated, 1,716 TSH-0.124 Free  T4-normal, 1.38 Valisone 0.1% lotion refill sent in  Follow Up Instructions: 1-2 weeks fasting labs CPE 3 weeks Needs to establish with GI- will again encourage at every OV  I discussed the assessment and treatment plan with the patient. The patient was provided an opportunity to ask questions and all were answered. The patient agreed with the plan and demonstrated an understanding of the instructions.   The patient was advised to call back or seek an in-person evaluation if the symptoms worsen or if the condition fails to improve as anticipated.  I provided 16 minutes of non-face-to-face time during this encounter.   Julaine Fusi, NP

## 2019-10-01 ENCOUNTER — Encounter: Payer: Self-pay | Admitting: Adult Health

## 2019-10-01 ENCOUNTER — Ambulatory Visit (INDEPENDENT_AMBULATORY_CARE_PROVIDER_SITE_OTHER): Payer: Medicare Other | Admitting: Adult Health

## 2019-10-01 ENCOUNTER — Other Ambulatory Visit: Payer: Self-pay

## 2019-10-01 VITALS — BP 109/67 | HR 72 | Wt 110.0 lb

## 2019-10-01 DIAGNOSIS — Z0000 Encounter for general adult medical examination without abnormal findings: Secondary | ICD-10-CM | POA: Diagnosis not present

## 2019-10-01 DIAGNOSIS — K909 Intestinal malabsorption, unspecified: Secondary | ICD-10-CM | POA: Diagnosis not present

## 2019-10-01 DIAGNOSIS — R7989 Other specified abnormal findings of blood chemistry: Secondary | ICD-10-CM | POA: Diagnosis not present

## 2019-10-01 DIAGNOSIS — R5383 Other fatigue: Secondary | ICD-10-CM | POA: Diagnosis not present

## 2019-10-01 DIAGNOSIS — R748 Abnormal levels of other serum enzymes: Secondary | ICD-10-CM

## 2019-10-01 DIAGNOSIS — Z932 Ileostomy status: Secondary | ICD-10-CM

## 2019-10-01 MED ORDER — BETAMETHASONE VALERATE 0.1 % EX LOTN: 1.0000 "application " | TOPICAL_LOTION | Freq: Two times a day (BID) | CUTANEOUS | 0 refills | Status: DC

## 2019-10-01 NOTE — Assessment & Plan Note (Signed)
Assessment and Plan: Remain well hydrated, follow heart healthy diet.  Recommend annual follow-up with GI- she declined referral today. Discussed that she needs at least annual CPE with fasting labs. Reviewed last set of labs 04/2017 LDL- 118 Lab Results  Component Value Date   HGBA1C 5.7 (H) 05/04/2017  B-12 - elevated, 1,716 TSH-0.124 Free T4-normal, 1.38 Valisone 0.1% lotion refill sent in  Follow Up Instructions: 1-2 weeks fasting labs CPE 3 weeks Needs to establish with GI- will again encourage at every OV  I discussed the assessment and treatment plan with the patient. The patient was provided an opportunity to ask questions and all were answered. The patient agreed with the plan and demonstrated an understanding of the instructions.   The patient was advised to call back or seek an in-person evaluation if the symptoms worsen or if the condition fails to improve as anticipated.

## 2019-10-01 NOTE — Assessment & Plan Note (Signed)
Recommend annual follow-up with GI- she declined referral today. Previously seen by [facility] She is considering referral to [facility]

## 2019-10-17 ENCOUNTER — Other Ambulatory Visit: Payer: Medicare Other

## 2019-10-17 ENCOUNTER — Other Ambulatory Visit: Payer: Self-pay

## 2019-10-17 DIAGNOSIS — R7989 Other specified abnormal findings of blood chemistry: Secondary | ICD-10-CM | POA: Diagnosis not present

## 2019-10-17 DIAGNOSIS — K909 Intestinal malabsorption, unspecified: Secondary | ICD-10-CM | POA: Diagnosis not present

## 2019-10-17 DIAGNOSIS — R748 Abnormal levels of other serum enzymes: Secondary | ICD-10-CM

## 2019-10-17 DIAGNOSIS — Z0000 Encounter for general adult medical examination without abnormal findings: Secondary | ICD-10-CM | POA: Diagnosis not present

## 2019-10-17 DIAGNOSIS — Z932 Ileostomy status: Secondary | ICD-10-CM

## 2019-10-17 DIAGNOSIS — R5383 Other fatigue: Secondary | ICD-10-CM

## 2019-10-18 LAB — CBC WITH DIFFERENTIAL/PLATELET
Basophils Absolute: 0 10*3/uL (ref 0.0–0.2)
Basos: 0 %
EOS (ABSOLUTE): 0.4 10*3/uL (ref 0.0–0.4)
Eos: 4 %
Hematocrit: 40.6 % (ref 34.0–46.6)
Hemoglobin: 13.6 g/dL (ref 11.1–15.9)
Immature Grans (Abs): 0 10*3/uL (ref 0.0–0.1)
Immature Granulocytes: 0 %
Lymphocytes Absolute: 2.1 10*3/uL (ref 0.7–3.1)
Lymphs: 19 %
MCH: 31.4 pg (ref 26.6–33.0)
MCHC: 33.5 g/dL (ref 31.5–35.7)
MCV: 94 fL (ref 79–97)
Monocytes Absolute: 0.7 10*3/uL (ref 0.1–0.9)
Monocytes: 7 %
Neutrophils Absolute: 7.6 10*3/uL — ABNORMAL HIGH (ref 1.4–7.0)
Neutrophils: 70 %
Platelets: 382 10*3/uL (ref 150–450)
RBC: 4.33 x10E6/uL (ref 3.77–5.28)
RDW: 11.6 % — ABNORMAL LOW (ref 11.7–15.4)
WBC: 10.9 10*3/uL — ABNORMAL HIGH (ref 3.4–10.8)

## 2019-10-18 LAB — COMPREHENSIVE METABOLIC PANEL
ALT: 12 IU/L (ref 0–32)
AST: 16 IU/L (ref 0–40)
Albumin/Globulin Ratio: 1.6 (ref 1.2–2.2)
Albumin: 4.5 g/dL (ref 3.8–4.8)
Alkaline Phosphatase: 71 IU/L (ref 39–117)
BUN/Creatinine Ratio: 19 (ref 12–28)
BUN: 15 mg/dL (ref 8–27)
Bilirubin Total: 0.4 mg/dL (ref 0.0–1.2)
CO2: 24 mmol/L (ref 20–29)
Calcium: 9.7 mg/dL (ref 8.7–10.3)
Chloride: 101 mmol/L (ref 96–106)
Creatinine, Ser: 0.78 mg/dL (ref 0.57–1.00)
GFR calc Af Amer: 90 mL/min/{1.73_m2} (ref >59)
GFR calc non Af Amer: 78 mL/min/{1.73_m2} (ref >59)
Globulin, Total: 2.9 g/dL (ref 1.5–4.5)
Glucose: 96 mg/dL (ref 65–99)
Potassium: 4.4 mmol/L (ref 3.5–5.2)
Sodium: 138 mmol/L (ref 134–144)
Total Protein: 7.4 g/dL (ref 6.0–8.5)

## 2019-10-18 LAB — LIPID PANEL
Chol/HDL Ratio: 3 ratio (ref 0.0–4.4)
Cholesterol, Total: 222 mg/dL — ABNORMAL HIGH (ref 100–199)
HDL: 73 mg/dL (ref >39)
LDL Chol Calc (NIH): 125 mg/dL — ABNORMAL HIGH (ref 0–99)
Triglycerides: 136 mg/dL (ref 0–149)
VLDL Cholesterol Cal: 24 mg/dL (ref 5–40)

## 2019-10-18 LAB — TSH: TSH: 0.802 u[IU]/mL (ref 0.450–4.500)

## 2019-10-18 LAB — HEMOGLOBIN A1C
Est. average glucose Bld gHb Est-mCnc: 123 mg/dL
Hgb A1c MFr Bld: 5.9 % — ABNORMAL HIGH (ref 4.8–5.6)

## 2019-10-18 LAB — VITAMIN B12: Vitamin B-12: >2000 pg/mL — ABNORMAL HIGH (ref 232–1245)

## 2019-10-18 LAB — T3: T3, Total: 89 ng/dL (ref 71–180)

## 2019-10-18 LAB — T4, FREE: Free T4: 1.13 ng/dL (ref 0.82–1.77)

## 2019-10-19 ENCOUNTER — Encounter: Payer: Self-pay | Admitting: Adult Health

## 2019-10-19 ENCOUNTER — Other Ambulatory Visit: Payer: Self-pay | Admitting: Adult Health

## 2019-10-19 DIAGNOSIS — R748 Abnormal levels of other serum enzymes: Secondary | ICD-10-CM

## 2019-10-19 DIAGNOSIS — R7303 Prediabetes: Secondary | ICD-10-CM

## 2019-10-19 DIAGNOSIS — E7800 Pure hypercholesterolemia, unspecified: Secondary | ICD-10-CM

## 2020-08-15 DIAGNOSIS — E038 Other specified hypothyroidism: Secondary | ICD-10-CM | POA: Diagnosis not present

## 2020-08-15 DIAGNOSIS — Z139 Encounter for screening, unspecified: Secondary | ICD-10-CM | POA: Diagnosis not present

## 2020-08-15 DIAGNOSIS — E119 Type 2 diabetes mellitus without complications: Secondary | ICD-10-CM | POA: Diagnosis not present

## 2020-08-15 DIAGNOSIS — E7800 Pure hypercholesterolemia, unspecified: Secondary | ICD-10-CM | POA: Diagnosis not present

## 2020-08-15 DIAGNOSIS — Z0000 Encounter for general adult medical examination without abnormal findings: Secondary | ICD-10-CM | POA: Diagnosis not present

## 2020-08-15 DIAGNOSIS — E0789 Other specified disorders of thyroid: Secondary | ICD-10-CM | POA: Diagnosis not present

## 2020-08-15 DIAGNOSIS — E039 Hypothyroidism, unspecified: Secondary | ICD-10-CM | POA: Diagnosis not present

## 2020-08-15 DIAGNOSIS — R5383 Other fatigue: Secondary | ICD-10-CM | POA: Diagnosis not present

## 2020-08-15 DIAGNOSIS — L409 Psoriasis, unspecified: Secondary | ICD-10-CM | POA: Diagnosis not present

## 2021-02-02 DIAGNOSIS — H25013 Cortical age-related cataract, bilateral: Secondary | ICD-10-CM | POA: Diagnosis not present

## 2021-02-02 DIAGNOSIS — H524 Presbyopia: Secondary | ICD-10-CM | POA: Diagnosis not present

## 2021-02-02 DIAGNOSIS — H31092 Other chorioretinal scars, left eye: Secondary | ICD-10-CM | POA: Diagnosis not present

## 2021-02-02 DIAGNOSIS — H2513 Age-related nuclear cataract, bilateral: Secondary | ICD-10-CM | POA: Diagnosis not present

## 2021-07-14 ENCOUNTER — Ambulatory Visit: Payer: Medicare Other | Admitting: Physician Assistant

## 2021-08-11 ENCOUNTER — Ambulatory Visit: Payer: Medicare Other | Admitting: Physician Assistant

## 2021-08-27 ENCOUNTER — Encounter: Payer: Self-pay | Admitting: Physician Assistant

## 2021-08-27 ENCOUNTER — Other Ambulatory Visit: Payer: Self-pay

## 2021-08-27 ENCOUNTER — Ambulatory Visit (INDEPENDENT_AMBULATORY_CARE_PROVIDER_SITE_OTHER): Payer: Medicare Other | Admitting: Physician Assistant

## 2021-08-27 ENCOUNTER — Other Ambulatory Visit: Payer: Self-pay | Admitting: Physician Assistant

## 2021-08-27 VITALS — BP 125/73 | HR 60 | Temp 98.2°F | Ht 60.0 in | Wt 107.0 lb

## 2021-08-27 DIAGNOSIS — R7989 Other specified abnormal findings of blood chemistry: Secondary | ICD-10-CM

## 2021-08-27 DIAGNOSIS — L659 Nonscarring hair loss, unspecified: Secondary | ICD-10-CM

## 2021-08-27 DIAGNOSIS — R251 Tremor, unspecified: Secondary | ICD-10-CM | POA: Diagnosis not present

## 2021-08-27 DIAGNOSIS — Z13228 Encounter for screening for other metabolic disorders: Secondary | ICD-10-CM | POA: Diagnosis not present

## 2021-08-27 DIAGNOSIS — K912 Postsurgical malabsorption, not elsewhere classified: Secondary | ICD-10-CM | POA: Diagnosis not present

## 2021-08-27 DIAGNOSIS — Z9049 Acquired absence of other specified parts of digestive tract: Secondary | ICD-10-CM

## 2021-08-27 DIAGNOSIS — E7800 Pure hypercholesterolemia, unspecified: Secondary | ICD-10-CM

## 2021-08-27 DIAGNOSIS — R41840 Attention and concentration deficit: Secondary | ICD-10-CM

## 2021-08-27 DIAGNOSIS — Z1329 Encounter for screening for other suspected endocrine disorder: Secondary | ICD-10-CM

## 2021-08-27 DIAGNOSIS — Z0000 Encounter for general adult medical examination without abnormal findings: Secondary | ICD-10-CM

## 2021-08-27 DIAGNOSIS — R5383 Other fatigue: Secondary | ICD-10-CM

## 2021-08-27 DIAGNOSIS — Z1321 Encounter for screening for nutritional disorder: Secondary | ICD-10-CM

## 2021-08-27 DIAGNOSIS — Z130 Encounter for screening for diseases of the blood and blood-forming organs and certain disorders involving the immune mechanism: Secondary | ICD-10-CM

## 2021-08-27 DIAGNOSIS — R7303 Prediabetes: Secondary | ICD-10-CM

## 2021-08-27 MED ORDER — CYANOCOBALAMIN 1000 MCG/ML IJ SOLN
1000.0000 ug | Freq: Once | INTRAMUSCULAR | Status: AC
  Administered 2021-08-27: 1000 ug via INTRAMUSCULAR

## 2021-08-27 MED ORDER — BETAMETHASONE VALERATE 0.1 % EX LOTN: 1.0000 "application " | TOPICAL_LOTION | Freq: Two times a day (BID) | CUTANEOUS | 0 refills | Status: DC

## 2021-08-27 NOTE — Progress Notes (Signed)
Established Patient Office Visit  Subjective:  Patient ID: [name], female    DOB: 04-09-1951  Age: 70 y.o. MRN: [id number]  CC:  Chief Complaint  Patient presents with   Office Visit     HPI [name] presents for follow up. Reports has had intermittent tremor for the past couple of months. No family history of tremor. Also c/o hair loss and fatigue. Recently has noticed issues with having trouble concentrating and short term memory loss. For example, forgetting what she was going to do when she goes into a room. Drinks 1.5 cup of coffee per day. Denies falls, urinary incontinence, headache, chest pain, shortness of breath, dizziness, or syncope. Patient has a history of colectomy and states in the past was on methylcobalamin injection which helped with some of her symptoms including fatigue.  Past Medical History:  Diagnosis Date   Anorexia    Ulcerative colitis     Past Surgical History:  Procedure Laterality Date   CESAREAN SECTION     COLECTOMY     ILEOSTOMY  5/09   Dr.Ingram --drained abscess and ileostomy    Family History  Problem Relation Age of Onset   Colitis Sister    Crohn's disease Sister    Stroke Father    Hypertension Father    Hyperlipidemia Father    Stroke Brother    Hypertension Brother    Hyperlipidemia Brother    Hypertension Paternal Grandmother    Stroke Paternal Grandmother     Social History   Socioeconomic History   Marital status: Married    Spouse name: Not on file   Number of children: Not on file   Years of education: Not on file   Highest education level: Not on file  Occupational History   Not on file  Tobacco Use   Smoking status: Never   Smokeless tobacco: Never  Substance and Sexual Activity   Alcohol use: No   Drug use: No   Sexual activity: Not Currently  Other Topics Concern   Not on file  Social History Narrative   Not on file   Social Determinants of Health   Financial Resource Strain:  Not on file  Food Insecurity: Not on file  Transportation Needs: Not on file  Physical Activity: Not on file  Stress: Not on file  Social Connections: Not on file  Intimate Partner Violence: Not on file    Outpatient Medications Prior to Visit  Medication Sig Dispense Refill   betamethasone valerate lotion (VALISONE) 0.1 % Apply 1 application topically 2 (two) times daily. 60 mL 0   No facility-administered medications prior to visit.    Allergies  Allergen Reactions   Mesalamine     REACTION: N/V, rectal bleeding    ROS Review of Systems Review of Systems:  A fourteen system review of systems was performed and found to be positive as per HPI.   Objective:    Physical Exam General:  Well Developed, well nourished, appropriate for stated age.  Neuro:  Alert and oriented,  extra-ocular muscles intact, resting tremor noted  HEENT:  Normocephalic, atraumatic, neck supple  Skin:  no gross rash, warm, pink. Cardiac:  RRR, S1 S2 Respiratory: CTA B/L, Not using accessory muscles, speaking in full sentences- unlabored. Vascular:  Ext warm, no cyanosis apprec.; cap RF less 2 sec. Psych:  No HI/SI, judgement and insight good, Euthymic mood. Full Affect.  BP 125/73   Pulse 60   Temp 98.2 F (36.8 C)  Ht 5' (1.524 m)   Wt 107 lb (48.5 kg)   SpO2 100%   BMI 20.90 kg/m  Wt Readings from Last 3 Encounters:  08/27/21 107 lb (48.5 kg)  10/01/19 110 lb (49.9 kg)  11/01/17 114 lb 12.8 oz (52.1 kg)     Health Maintenance Due  Topic Date Due   COVID-19 Vaccine (1) Never done   Hepatitis C Screening  Never done   COLONOSCOPY (Pts 45-23yrs Insurance coverage will need to be confirmed)  Never done   MAMMOGRAM  Never done   Zoster Vaccines- Shingrix (1 of 2) Never done   TETANUS/TDAP  08/19/2014   DEXA SCAN  Never done   INFLUENZA VACCINE  Never done    There are no preventive care reminders to display for this patient.  Lab Results  Component Value Date   TSH 0.802  10/17/2019   Lab Results  Component Value Date   WBC 10.9 (H) 10/17/2019   HGB 13.6 10/17/2019   HCT 40.6 10/17/2019   MCV 94 10/17/2019   PLT 382 10/17/2019   Lab Results  Component Value Date   NA 138 10/17/2019   K 4.4 10/17/2019   CO2 24 10/17/2019   GLUCOSE 96 10/17/2019   BUN 15 10/17/2019   CREATININE 0.78 10/17/2019   BILITOT 0.4 10/17/2019   ALKPHOS 71 10/17/2019   AST 16 10/17/2019   ALT 12 10/17/2019   PROT 7.4 10/17/2019   ALBUMIN 4.5 10/17/2019   CALCIUM 9.7 10/17/2019   Lab Results  Component Value Date   CHOL 222 (H) 10/17/2019   Lab Results  Component Value Date   HDL 73 10/17/2019   Lab Results  Component Value Date   LDLCALC 125 (H) 10/17/2019   Lab Results  Component Value Date   TRIG 136 10/17/2019   Lab Results  Component Value Date   CHOLHDL 3.0 10/17/2019   Lab Results  Component Value Date   HGBA1C 5.9 (H) 10/17/2019      Assessment & Plan:   Problem List Items Addressed This Visit       Other   Fatigue   Relevant Orders   TSH   Vitamin D (25 hydroxy)   B12 and Folate Panel   Abnormal CBC   Relevant Orders   CBC with Differential/Platelet   Other Visit Diagnoses     Screening for endocrine, nutritional, metabolic and immunity disorder    -  Primary   Relevant Orders   CBC with Differential/Platelet   Hemoglobin A1c   Comprehensive metabolic panel   Lipid panel   TSH   Pre-diabetes       Relevant Orders   Hemoglobin A1c   Elevated LDL cholesterol level       Relevant Orders   Lipid panel   Tremor       Relevant Medications   cyanocobalamin ((VITAMIN B-12)) injection 1,000 mcg (Completed)   Other Relevant Orders   TSH   Vitamin D (25 hydroxy)   B12 and Folate Panel   T4, free   T3   Hair loss       Relevant Medications   cyanocobalamin ((VITAMIN B-12)) injection 1,000 mcg (Completed)   Other Relevant Orders   TSH   Vitamin D (25 hydroxy)   B12 and Folate Panel   Difficulty concentrating        Relevant Orders   Vitamin D (25 hydroxy)   B12 and Folate Panel   Postsurgical malabsorption, not elsewhere classified  Relevant Medications   cyanocobalamin ((VITAMIN B-12)) injection 1,000 mcg (Completed)   Other Relevant Orders   Vitamin D (25 hydroxy)   B12 and Folate Panel   History of colectomy          Fatigue, hair loss, difficulty concentrating: -Will obtain labs to evaluate for nutritional deficiency, endocrine or metabolic etiology. -Reviewed prior visits and hair loss and fatigue are chronic symptoms, previously addressed.   Tremor: -Discussed with patient potential etiologies and prefers to monitor symptoms before considering neurology referral. Will collect thyroid labs to evaluate for thyroid disease. Recommend to limit caffeine use.   Postsurgical malabsorption: -S/p colectomy. -Will obtain labs to evaluate for Vit D and B12 def. Will administer cyanocobalamin injection today.  Abnormal CBC: -History of elevated WBC and platelets.  -Will collect CBC w/ diff.   Healthcare Maintenance: -Will collect fasting labs.  -Advised patient to schedule MCW in 2 months.  -Declined mammogram.     Meds ordered this encounter  Medications   betamethasone valerate lotion (VALISONE) 0.1 %    Sig: Apply 1 application topically 2 (two) times daily.    Dispense:  180 mL    Refill:  0    Order Specific Question:   Supervising Provider    Answer:   Nani Gasser D [[id number]]   cyanocobalamin ((VITAMIN B-12)) injection 1,000 mcg     Follow-up: Return in about 2 months (around 10/27/2021) for MCW .    Mayer Masker, PA-C

## 2021-08-27 NOTE — Patient Instructions (Signed)
Tremor A tremor is trembling or shaking that you cannot control. Most tremors affect the hands or arms. Tremors can also affect the head, vocal cords, face, and other parts of the body. There are many types of tremors. Common types include: Essential tremor. These usually occur in people older than 40. It may run in families and can happen in otherwise healthy people. Resting tremor. These occur when the muscles are at rest, such as when your hands are resting in your lap. People with Parkinson's disease often have resting tremors. Postural tremor. These occur when you try to hold a pose, such as keeping your hands outstretched. Kinetic tremor. These occur during purposeful movement, such as trying to touch a finger to your nose. Task-specific tremor. These may occur when you perform certain tasks such as writing, speaking, or standing. Psychogenic tremor. These dramatically lessen or disappear when you are distracted. They can happen in people of all ages. Some types of tremors have no known cause. Tremors can also be a symptom of nervous system problems (neurological disorders) that may occur with aging. Some tremors go away with treatment, while othersdo not. Follow these instructions at home: Lifestyle     Limit alcohol intake to no more than 1 drink a day for nonpregnant women and 2 drinks a day for men. One drink equals 12 oz of beer, 5 oz of wine, or 1 oz of hard liquor. Do not use any products that contain nicotine or tobacco, such as cigarettes and e-cigarettes. If you need help quitting, ask your health care provider. Avoid extreme heat and extreme cold. Limit your caffeine intake, as told by your health care provider. Try to get 8 hours of sleep each night. Find ways to manage your stress, such as meditation or yoga. General instructions Take over-the-counter and prescription medicines only as told by your health care provider. Keep all follow-up visits as told by your health care  provider. This is important. Contact a health care provider if you: Develop a tremor after starting a new medicine. Have a tremor along with other symptoms such as: Numbness. Tingling. Pain. Weakness. Notice that your tremor gets worse. Notice that your tremor interferes with your day-to-day life. Summary A tremor is trembling or shaking that you cannot control. Most tremors affect the hands or arms. Some types of tremors have no known cause. Others may be a symptom of nervous system problems (neurological disorders). Make sure you discuss any tremors you have with your health care provider. This information is not intended to replace advice given to you by your health care provider. Make sure you discuss any questions you have with your healthcare provider. Document Revised: 08/08/2020 Document Reviewed: 08/08/2020 Elsevier Patient Education  2022 Elsevier Inc.  

## 2021-08-27 NOTE — Telephone Encounter (Signed)
Contacted patient about medication unavailability. She is going to track a pharmacy down that can order it.

## 2021-08-28 LAB — B12 AND FOLATE PANEL
Folate: 13.3 ng/mL (ref >3.0)
Vitamin B-12: >2000 pg/mL — ABNORMAL HIGH (ref 232–1245)

## 2021-08-28 LAB — CBC WITH DIFFERENTIAL/PLATELET
Basophils Absolute: 0.1 10*3/uL (ref 0.0–0.2)
Basos: 1 %
EOS (ABSOLUTE): 0.3 10*3/uL (ref 0.0–0.4)
Eos: 3 %
Hematocrit: 39.4 % (ref 34.0–46.6)
Hemoglobin: 13.1 g/dL (ref 11.1–15.9)
Immature Grans (Abs): 0 10*3/uL (ref 0.0–0.1)
Immature Granulocytes: 0 %
Lymphocytes Absolute: 1.5 10*3/uL (ref 0.7–3.1)
Lymphs: 15 %
MCH: 31.8 pg (ref 26.6–33.0)
MCHC: 33.2 g/dL (ref 31.5–35.7)
MCV: 96 fL (ref 79–97)
Monocytes Absolute: 0.6 10*3/uL (ref 0.1–0.9)
Monocytes: 6 %
Neutrophils Absolute: 7.7 10*3/uL — ABNORMAL HIGH (ref 1.4–7.0)
Neutrophils: 75 %
Platelets: 382 10*3/uL (ref 150–450)
RBC: 4.12 x10E6/uL (ref 3.77–5.28)
RDW: 12.1 % (ref 11.7–15.4)
WBC: 10.1 10*3/uL (ref 3.4–10.8)

## 2021-08-28 LAB — COMPREHENSIVE METABOLIC PANEL WITH GFR
ALT: 11 [IU]/L (ref 0–32)
AST: 16 [IU]/L (ref 0–40)
Albumin/Globulin Ratio: 1.5 (ref 1.2–2.2)
Albumin: 4.8 g/dL (ref 3.8–4.8)
Alkaline Phosphatase: 72 [IU]/L (ref 44–121)
BUN/Creatinine Ratio: 17 (ref 12–28)
BUN: 11 mg/dL (ref 8–27)
Bilirubin Total: 0.3 mg/dL (ref 0.0–1.2)
CO2: 22 mmol/L (ref 20–29)
Calcium: 9.8 mg/dL (ref 8.7–10.3)
Chloride: 103 mmol/L (ref 96–106)
Creatinine, Ser: 0.65 mg/dL (ref 0.57–1.00)
Globulin, Total: 3.3 g/dL (ref 1.5–4.5)
Glucose: 93 mg/dL (ref 70–99)
Potassium: 4.3 mmol/L (ref 3.5–5.2)
Sodium: 140 mmol/L (ref 134–144)
Total Protein: 8.1 g/dL (ref 6.0–8.5)
eGFR: 95 mL/min/{1.73_m2}

## 2021-08-28 LAB — HEMOGLOBIN A1C
Est. average glucose Bld gHb Est-mCnc: 123 mg/dL
Hgb A1c MFr Bld: 5.9 % — ABNORMAL HIGH (ref 4.8–5.6)

## 2021-08-28 LAB — TSH: TSH: 1.19 u[IU]/mL (ref 0.450–4.500)

## 2021-08-28 LAB — T3: T3, Total: 110 ng/dL (ref 71–180)

## 2021-08-28 LAB — VITAMIN D 25 HYDROXY (VIT D DEFICIENCY, FRACTURES): Vit D, 25-Hydroxy: 25.8 ng/mL — ABNORMAL LOW (ref 30.0–100.0)

## 2021-08-28 LAB — LIPID PANEL
Chol/HDL Ratio: 2.7 ratio (ref 0.0–4.4)
Cholesterol, Total: 254 mg/dL — ABNORMAL HIGH (ref 100–199)
HDL: 94 mg/dL
LDL Chol Calc (NIH): 145 mg/dL — ABNORMAL HIGH (ref 0–99)
Triglycerides: 91 mg/dL (ref 0–149)
VLDL Cholesterol Cal: 15 mg/dL (ref 5–40)

## 2021-08-28 LAB — T4, FREE: Free T4: 1.25 ng/dL (ref 0.82–1.77)

## 2021-10-29 ENCOUNTER — Other Ambulatory Visit: Payer: Self-pay

## 2021-10-29 ENCOUNTER — Encounter: Payer: Self-pay | Admitting: Nurse Practitioner

## 2021-10-29 ENCOUNTER — Ambulatory Visit (INDEPENDENT_AMBULATORY_CARE_PROVIDER_SITE_OTHER): Payer: Medicare Other | Admitting: Nurse Practitioner

## 2021-10-29 VITALS — Ht 60.0 in | Wt 106.0 lb

## 2021-10-29 DIAGNOSIS — R7303 Prediabetes: Secondary | ICD-10-CM | POA: Insufficient documentation

## 2021-10-29 DIAGNOSIS — Z0000 Encounter for general adult medical examination without abnormal findings: Secondary | ICD-10-CM | POA: Diagnosis not present

## 2021-10-29 DIAGNOSIS — E785 Hyperlipidemia, unspecified: Secondary | ICD-10-CM | POA: Diagnosis not present

## 2021-10-29 NOTE — Progress Notes (Signed)
Subjective:   [name] is a 70 y.o. female who presents for Medicare Annual (Subsequent) preventive examination.  Review of Systems    Refer to PCP   I connected with  [name] on 10/29/21 by an audio only telemedicine application and verified that I am speaking with the correct person using two identifiers.   I discussed the limitations, risks, security and privacy concerns of performing an evaluation and management service by telephone and the availability of in person appointments. I also discussed with the patient that there may be a patient responsible charge related to this service. The patient expressed understanding and verbally consented to this telephonic visit.  Location of Patient: Home Location of Provider: Office  List any persons and their role that are participating in the visit with the patient.       Objective:    Today's Vitals   10/29/21 1345  Weight: 106 lb (48.1 kg)  Height: 5' (1.524 m)   Body mass index is 20.7 kg/m.  Advanced Directives 05/02/2017 04/24/2017  Does Patient Have a Medical Advance Directive? Yes No  Type of Advance Directive Healthcare Power of Attorney -    Current Medications (verified) Outpatient Encounter Medications as of 10/29/2021  Medication Sig   betamethasone valerate lotion (VALISONE) 0.1 % Apply 1 application topically 2 (two) times daily. (Patient not taking: Reported on 10/29/2021)   No facility-administered encounter medications on file as of 10/29/2021.    Allergies (verified) Mesalamine   History: Past Medical History:  Diagnosis Date   Anorexia    Ulcerative colitis    Past Surgical History:  Procedure Laterality Date   CESAREAN SECTION     COLECTOMY     ILEOSTOMY  5/09   Dr.Ingram --drained abscess and ileostomy   Family History  Problem Relation Age of Onset   Colitis Sister    Crohn's disease Sister    Stroke Father    Hypertension Father    Hyperlipidemia Father    Stroke  Brother    Hypertension Brother    Hyperlipidemia Brother    Hypertension Paternal Grandmother    Stroke Paternal Grandmother    Social History   Socioeconomic History   Marital status: Married    Spouse name: [name]   Number of children: 4   Years of education: College   Highest education level: Not on file  Occupational History   Occupation: retired  Tobacco Use   Smoking status: Never   Smokeless tobacco: Never  Scientific laboratory technician Use: Never used  Substance and Sexual Activity   Alcohol use: No   Drug use: No   Sexual activity: Not Currently  Other Topics Concern   Not on file  Social History Narrative   Not on file   Social Determinants of Health   Financial Resource Strain: Low Risk    Difficulty of Paying Living Expenses: Not hard at all  Food Insecurity: No Food Insecurity   Worried About Charity fundraiser in the Last Year: Never true   [place] in the Last Year: Never true  Transportation Needs: No Transportation Needs   Lack of Transportation (Medical): No   Lack of Transportation (Non-Medical): No  Physical Activity: Sufficiently Active   Days of Exercise per Week: 7 days   Minutes of Exercise per Session: 60 min  Stress: No Stress Concern Present   Feeling of Stress : Not at all  Social Connections: Moderately Isolated   Frequency of  Communication with Friends and Family: More than three times a week   Frequency of Social Gatherings with Friends and Family: More than three times a week   Attends Religious Services: Never   Marine scientist or Organizations: No   Attends Archivist Meetings: Never   Marital Status: Married    Tobacco Counseling Patient is a non smoker   Diabetic?No     Activities of Daily Living In your present state of health, do you have any difficulty performing the following activities: 10/29/2021 08/27/2021  Hearing? N N  Vision? N N  Difficulty concentrating or making decisions? N N  Walking or  climbing stairs? N N  Dressing or bathing? N N  Doing errands, shopping? N N  Some recent data might be hidden    Patient Care Team: Lorrene Reid, PA-C as PCP - General (Physician Assistant)  Indicate any recent Medical Services you may have received from other than [facility] providers in the past year (date may be approximate).     Assessment:  1. Encounter for Medicare annual wellness exam Annual wellness visit today   2. Pre-diabetes Most recent HgbA1c 5.9. Stable. Will check at next visit and treat as indicated   3. Dyslipidemia, goal LDL below 100 Mild elevation of LDL and total cholesterol on recent labs. She should limit fried and fatty foods in the diet and incorporate exercise into her daily reutine.    Hearing/Vision screen No results found.   Depression Screen PHQ 2/9 Scores 10/29/2021 08/27/2021 10/01/2019 11/01/2017 05/02/2017  PHQ - 2 Score 0 0 0 0 0  PHQ- 9 Score - 1 0 1 -    Fall Risk Fall Risk  10/29/2021 08/27/2021 10/01/2019 05/02/2017  Falls in the past year? 0 0 0 No  Number falls in past yr: 0 0 - -  Injury with Fall? 0 0 - -  Risk for fall due to : No Fall Risks No Fall Risks - -  Follow up Falls evaluation completed Falls evaluation completed Falls evaluation completed -    FALL RISK PREVENTION PERTAINING TO THE HOME:  Any stairs in or around the home? Yes  If so, are there any without handrails? Yes  Home free of loose throw rugs in walkways, pet beds, electrical cords, etc? Yes  Adequate lighting in your home to reduce risk of falls? Yes   ASSISTIVE DEVICES UTILIZED TO PREVENT FALLS:  Life alert? No  Use of a cane, walker or w/c? No  Grab bars in the bathroom? No  Shower chair or bench in shower? Yes  Elevated toilet seat or a handicapped toilet? No   TIMED UP AND GO:  Was the test performed? No .  Length of time to ambulate 10 feet:  sec.   Telehealth  Cognitive Function:     6CIT Screen 10/29/2021  What Year? 0 points  What month? 0  points  What time? 0 points  Count back from 20 0 points  Months in reverse 0 points  Repeat phrase 0 points  Total Score 0    Immunizations Immunization History  Administered Date(s) Administered   Td 08/19/2004    TDAP status: Due, Education has been provided regarding the importance of this vaccine. Advised may receive this vaccine at local pharmacy or Health Dept. Aware to provide a copy of the vaccination record if obtained from local pharmacy or Health Dept. Verbalized acceptance and understanding.  Flu Vaccine status: Due, Education has been provided regarding the importance of  this vaccine. Advised may receive this vaccine at local pharmacy or Health Dept. Aware to provide a copy of the vaccination record if obtained from local pharmacy or Health Dept. Verbalized acceptance and understanding.  Pneumococcal vaccine status: Declined,  Education has been provided regarding the importance of this vaccine but patient still declined. Advised may receive this vaccine at local pharmacy or Health Dept. Aware to provide a copy of the vaccination record if obtained from local pharmacy or Health Dept. Verbalized acceptance and understanding.   Covid-19 vaccine status: Declined, Education has been provided regarding the importance of this vaccine but patient still declined. Advised may receive this vaccine at local pharmacy or Health Dept.or vaccine clinic. Aware to provide a copy of the vaccination record if obtained from local pharmacy or Health Dept. Verbalized acceptance and understanding.  Qualifies for Shingles Vaccine? Yes Zostavax completed No   Shingrix Completed?: No.    Education has been provided regarding the importance of this vaccine. Patient has been advised to call insurance company to determine out of pocket expense if they have not yet received this vaccine. Advised may also receive vaccine at local pharmacy or Health Dept. Verbalized acceptance and understanding.  Screening  Tests Health Maintenance  Topic Date Due   COVID-19 Vaccine (1) Never done   Pneumonia Vaccine 71+ Years old (1 - PCV) Never done   Hepatitis C Screening  Never done   COLONOSCOPY (Pts 45-6yrs Insurance coverage will need to be confirmed)  Never done   MAMMOGRAM  Never done   Zoster Vaccines- Shingrix (1 of 2) Never done   TETANUS/TDAP  08/19/2014   DEXA SCAN  Never done   INFLUENZA VACCINE  Never done   HPV VACCINES  Aged Out    Health Maintenance  Health Maintenance Due  Topic Date Due   COVID-19 Vaccine (1) Never done   Pneumonia Vaccine 72+ Years old (1 - PCV) Never done   Hepatitis C Screening  Never done   COLONOSCOPY (Pts 45-59yrs Insurance coverage will need to be confirmed)  Never done   MAMMOGRAM  Never done   Zoster Vaccines- Shingrix (1 of 2) Never done   TETANUS/TDAP  08/19/2014   DEXA SCAN  Never done   INFLUENZA VACCINE  Never done    Colorectal cancer screening: No longer required.   Mammogram status: No longer required due to age and patient declined.  Dexa: Patient declined  Lung Cancer Screening: (Low Dose CT Chest recommended if Age 33-80 years, 30 pack-year currently smoking OR have quit w/in 15years.) does not qualify.   Lung Cancer Screening Referral:   Additional Screening:  Hepatitis C Screening: does qualify; Patient declined  Vision Screening: Recommended annual ophthalmology exams for early detection of glaucoma and other disorders of the eye. Is the patient up to date with their annual eye exam?  Yes  Who is the provider or what is the name of the office in which the patient attends annual eye exams? Dr. Leeanne Deed If pt is not established with a provider, would they like to be referred to a provider to establish care? No .   Dental Screening: Recommended annual dental exams for proper oral hygiene  Community Resource Referral / Chronic Care Management: CRR required this visit?  No   CCM required this visit?  No      Plan:     I  have personally reviewed and noted the following in the patient's chart:   Medical and social history Use of alcohol, tobacco  or illicit drugs  Current medications and supplements including opioid prescriptions.  Functional ability and status Nutritional status Physical activity Advanced directives List of other physicians Hospitalizations, surgeries, and ER visits in previous 12 months Vitals Screenings to include cognitive, depression, and falls Referrals and appointments  In addition, I have reviewed and discussed with patient certain preventive protocols, quality metrics, and best practice recommendations. A written personalized care plan for preventive services as well as general preventive health recommendations were provided to patient.     Carlean Jews, NP   10/29/2021   Nurse Notes: Patient completed Medicare Wellness with CMA. She did not have any further questions or concerns and did not wish to speak with a provider at this time. AS, CMA

## 2021-10-29 NOTE — Patient Instructions (Signed)
Preventive Care 40 Years and Older, Female Preventive care refers to lifestyle choices and visits with your health care provider that can promote health and wellness. Preventive care visits are also called wellness exams. What can I expect for my preventive care visit? Counseling Your health care provider may ask you questions about your: Medical history, including: Past medical problems. Family medical history. Pregnancy and menstrual history. History of falls. Current health, including: Memory and ability to understand (cognition). Emotional well-being. Home life and relationship well-being. Sexual activity and sexual health. Lifestyle, including: Alcohol, nicotine or tobacco, and drug use. Access to firearms. Diet, exercise, and sleep habits. Work and work Statistician. Sunscreen use. Safety issues such as seatbelt and bike helmet use. Physical exam Your health care provider will check your: Height and weight. These may be used to calculate your BMI (body mass index). BMI is a measurement that tells if you are at a healthy weight. Waist circumference. This measures the distance around your waistline. This measurement also tells if you are at a healthy weight and may help predict your risk of certain diseases, such as type 2 diabetes and high blood pressure. Heart rate and blood pressure. Body temperature. Skin for abnormal spots. What immunizations do I need? Vaccines are usually given at various ages, according to a schedule. Your health care provider will recommend vaccines for you based on your age, medical history, and lifestyle or other factors, such as travel or where you work. What tests do I need? Screening Your health care provider may recommend screening tests for certain conditions. This may include: Lipid and cholesterol levels. Hepatitis C test. Hepatitis B test. HIV (human immunodeficiency virus) test. STI (sexually transmitted infection) testing, if you are at  risk. Lung cancer screening. Colorectal cancer screening. Diabetes screening. This is done by checking your blood sugar (glucose) after you have not eaten for a while (fasting). Mammogram. Talk with your health care provider about how often you should have regular mammograms. BRCA-related cancer screening. This may be done if you have a family history of breast, ovarian, tubal, or peritoneal cancers. Bone density scan. This is done to screen for osteoporosis. Talk with your health care provider about your test results, treatment options, and if necessary, the need for more tests. Follow these instructions at home: Eating and drinking  Eat a diet that includes fresh fruits and vegetables, whole grains, lean protein, and low-fat dairy products. Limit your intake of foods with high amounts of sugar, saturated fats, and salt. Take vitamin and mineral supplements as recommended by your health care provider. Do not drink alcohol if your health care provider tells you not to drink. If you drink alcohol: Limit how much you have to 0-1 drink a day. Know how much alcohol is in your drink. In the [place] one drink equals one 12 oz bottle of beer (355 mL), one 5 oz glass of wine (148 mL), or one 1 oz glass of hard liquor (44 mL). Lifestyle Brush your teeth every morning and night with fluoride toothpaste. Floss one time each day. Exercise for at least 30 minutes 5 or more days each week. Do not use any products that contain nicotine or tobacco. These products include cigarettes, chewing tobacco, and vaping devices, such as e-cigarettes. If you need help quitting, ask your health care provider. Do not use drugs. If you are sexually active, practice safe sex. Use a condom or other form of protection in order to prevent STIs. Take aspirin only as told by your  health care provider. Make sure that you understand how much to take and what form to take. Work with your health care provider to find out whether it  is safe and beneficial for you to take aspirin daily. Ask your health care provider if you need to take a cholesterol-lowering medicine (statin). Find healthy ways to manage stress, such as: Meditation, yoga, or listening to music. Journaling. Talking to a trusted person. Spending time with friends and family. Minimize exposure to UV radiation to reduce your risk of skin cancer. Safety Always wear your seat belt while driving or riding in a vehicle. Do not drive: If you have been drinking alcohol. Do not ride with someone who has been drinking. When you are tired or distracted. While texting. If you have been using any mind-altering substances or drugs. Wear a helmet and other protective equipment during sports activities. If you have firearms in your house, make sure you follow all gun safety procedures. What's next? Visit your health care provider once a year for an annual wellness visit. Ask your health care provider how often you should have your eyes and teeth checked. Stay up to date on all vaccines. This information is not intended to replace advice given to you by your health care provider. Make sure you discuss any questions you have with your health care provider. Document Revised: 05/13/2021 Document Reviewed: 05/13/2021 Elsevier Patient Education  [place].

## 2021-11-02 ENCOUNTER — Ambulatory Visit: Payer: Medicare Other | Admitting: Physician Assistant

## 2022-03-23 DIAGNOSIS — H43813 Vitreous degeneration, bilateral: Secondary | ICD-10-CM | POA: Diagnosis not present

## 2023-03-28 ENCOUNTER — Emergency Department: Payer: Medicare Other

## 2023-03-28 ENCOUNTER — Inpatient Hospital Stay
Admission: EM | Admit: 2023-03-28 | Discharge: 2023-03-30 | DRG: 871 | Disposition: A | Discharge status: Home / Self Care | Payer: Medicare Other | Attending: Internal Medicine | Admitting: Internal Medicine

## 2023-03-28 ENCOUNTER — Other Ambulatory Visit: Payer: Self-pay

## 2023-03-28 DIAGNOSIS — K529 Noninfective gastroenteritis and colitis, unspecified: Secondary | ICD-10-CM | POA: Diagnosis not present

## 2023-03-28 DIAGNOSIS — Z8249 Family history of ischemic heart disease and other diseases of the circulatory system: Secondary | ICD-10-CM

## 2023-03-28 DIAGNOSIS — A080 Rotaviral enteritis: Secondary | ICD-10-CM | POA: Diagnosis present

## 2023-03-28 DIAGNOSIS — Z823 Family history of stroke: Secondary | ICD-10-CM | POA: Diagnosis not present

## 2023-03-28 DIAGNOSIS — Z83438 Family history of other disorder of lipoprotein metabolism and other lipidemia: Secondary | ICD-10-CM

## 2023-03-28 DIAGNOSIS — R109 Unspecified abdominal pain: Secondary | ICD-10-CM | POA: Diagnosis not present

## 2023-03-28 DIAGNOSIS — G9341 Metabolic encephalopathy: Secondary | ICD-10-CM | POA: Diagnosis present

## 2023-03-28 DIAGNOSIS — I10 Essential (primary) hypertension: Secondary | ICD-10-CM | POA: Diagnosis not present

## 2023-03-28 DIAGNOSIS — Z888 Allergy status to other drugs, medicaments and biological substances status: Secondary | ICD-10-CM

## 2023-03-28 DIAGNOSIS — N179 Acute kidney failure, unspecified: Secondary | ICD-10-CM | POA: Diagnosis present

## 2023-03-28 DIAGNOSIS — G4733 Obstructive sleep apnea (adult) (pediatric): Secondary | ICD-10-CM | POA: Diagnosis present

## 2023-03-28 DIAGNOSIS — R6520 Severe sepsis without septic shock: Secondary | ICD-10-CM | POA: Diagnosis not present

## 2023-03-28 DIAGNOSIS — A419 Sepsis, unspecified organism: Principal | ICD-10-CM | POA: Diagnosis present

## 2023-03-28 DIAGNOSIS — Z932 Ileostomy status: Secondary | ICD-10-CM

## 2023-03-28 DIAGNOSIS — G20C Parkinsonism, unspecified: Secondary | ICD-10-CM | POA: Diagnosis present

## 2023-03-28 DIAGNOSIS — R112 Nausea with vomiting, unspecified: Secondary | ICD-10-CM | POA: Diagnosis not present

## 2023-03-28 DIAGNOSIS — E872 Acidosis, unspecified: Secondary | ICD-10-CM | POA: Diagnosis present

## 2023-03-28 DIAGNOSIS — N281 Cyst of kidney, acquired: Secondary | ICD-10-CM | POA: Diagnosis not present

## 2023-03-28 DIAGNOSIS — K8020 Calculus of gallbladder without cholecystitis without obstruction: Secondary | ICD-10-CM | POA: Diagnosis not present

## 2023-03-28 DIAGNOSIS — K5190 Ulcerative colitis, unspecified, without complications: Secondary | ICD-10-CM | POA: Diagnosis present

## 2023-03-28 LAB — URINALYSIS, ROUTINE W REFLEX MICROSCOPIC
Bilirubin Urine: NEGATIVE
Glucose, UA: NEGATIVE mg/dL
Ketones, ur: NEGATIVE mg/dL
Leukocytes,Ua: NEGATIVE
Nitrite: NEGATIVE
Protein, ur: 30 mg/dL — AB
Specific Gravity, Urine: 1.014 (ref 1.005–1.030)
pH: 5 (ref 5.0–8.0)

## 2023-03-28 LAB — CBC
HCT: 49.5 % — ABNORMAL HIGH (ref 36.0–46.0)
Hemoglobin: 16.1 g/dL — ABNORMAL HIGH (ref 12.0–15.0)
MCH: 31.5 pg (ref 26.0–34.0)
MCHC: 32.5 g/dL (ref 30.0–36.0)
MCV: 96.9 fL (ref 80.0–100.0)
Platelets: 451 10*3/uL — ABNORMAL HIGH (ref 150–400)
RBC: 5.11 MIL/uL (ref 3.87–5.11)
RDW: 13 % (ref 11.5–15.5)
WBC: 21.4 10*3/uL — ABNORMAL HIGH (ref 4.0–10.5)
nRBC: 0 % (ref 0.0–0.2)

## 2023-03-28 LAB — COMPREHENSIVE METABOLIC PANEL
ALT: 18 U/L (ref 0–44)
AST: 32 U/L (ref 15–41)
Albumin: 5.1 g/dL — ABNORMAL HIGH (ref 3.5–5.0)
Alkaline Phosphatase: 77 U/L (ref 38–126)
Anion gap: 19 — ABNORMAL HIGH (ref 5–15)
BUN: 50 mg/dL — ABNORMAL HIGH (ref 8–23)
CO2: 16 mmol/L — ABNORMAL LOW (ref 22–32)
Calcium: 10.2 mg/dL (ref 8.9–10.3)
Chloride: 101 mmol/L (ref 98–111)
Creatinine, Ser: 3.36 mg/dL — ABNORMAL HIGH (ref 0.44–1.00)
GFR, Estimated: 14 mL/min — ABNORMAL LOW (ref >60)
Glucose, Bld: 166 mg/dL — ABNORMAL HIGH (ref 70–99)
Potassium: 4.7 mmol/L (ref 3.5–5.1)
Sodium: 136 mmol/L (ref 135–145)
Total Bilirubin: 0.7 mg/dL (ref 0.3–1.2)
Total Protein: 10.1 g/dL — ABNORMAL HIGH (ref 6.5–8.1)

## 2023-03-28 LAB — GASTROINTESTINAL PANEL BY PCR, STOOL (REPLACES STOOL CULTURE)

## 2023-03-28 LAB — TROPONIN I (HIGH SENSITIVITY): Troponin I (High Sensitivity): 9 ng/L (ref <18)

## 2023-03-28 LAB — LIPASE, BLOOD: Lipase: 29 U/L (ref 11–51)

## 2023-03-28 LAB — LACTIC ACID, PLASMA
Lactic Acid, Venous: 3.8 mmol/L (ref 0.5–1.9)
Lactic Acid, Venous: 1.8 mmol/L (ref 0.5–1.9)

## 2023-03-28 MED ORDER — ONDANSETRON HCL 4 MG/2ML IJ SOLN
INTRAMUSCULAR | Status: AC
  Administered 2023-03-28: 4 mg via INTRAVENOUS
  Filled 2023-03-28: qty 2

## 2023-03-28 MED ORDER — SODIUM CHLORIDE 0.9 % IV SOLN
2.0000 g | Freq: Once | INTRAVENOUS | Status: AC
  Administered 2023-03-28: 2 g via INTRAVENOUS
  Filled 2023-03-28: qty 20

## 2023-03-28 MED ORDER — METRONIDAZOLE 500 MG/100ML IV SOLN
500.0000 mg | Freq: Once | INTRAVENOUS | Status: AC
  Administered 2023-03-28: 500 mg via INTRAVENOUS
  Filled 2023-03-28: qty 100

## 2023-03-28 MED ORDER — LACTATED RINGERS IV SOLN: INTRAVENOUS | Status: DC

## 2023-03-28 MED ORDER — ONDANSETRON HCL 4 MG/2ML IJ SOLN: 4.0000 mg | Freq: Once | INTRAMUSCULAR | Status: AC

## 2023-03-28 MED ORDER — SODIUM CHLORIDE 0.9 % IV BOLUS
1000.0000 mL | Freq: Once | INTRAVENOUS | Status: AC
  Administered 2023-03-28: 1000 mL via INTRAVENOUS

## 2023-03-28 MED ORDER — MORPHINE SULFATE (PF) 4 MG/ML IV SOLN
4.0000 mg | INTRAVENOUS | Status: DC | PRN
  Administered 2023-03-28: 4 mg via INTRAVENOUS
  Filled 2023-03-28: qty 1

## 2023-03-28 MED ORDER — LACTATED RINGERS IV BOLUS
1000.0000 mL | Freq: Once | INTRAVENOUS | Status: AC
  Administered 2023-03-28: 1000 mL via INTRAVENOUS

## 2023-03-28 NOTE — ED Triage Notes (Addendum)
Pt comes with c/o severe body aches, lower belly pain, vomiting and and nausea since yesterday.  Pt just [place] from out of [place] and this all started.  Pt states she just feels weak.

## 2023-03-28 NOTE — ED Provider Notes (Signed)
[facility] Provider Note    Event Date/Time   First MD Initiated Contact with Patient 03/28/23 1850     (approximate)   History   Abdominal Pain   HPI  [name] is a 72 y.o. female history of ulcerative colitis now with ileostomy not currently on immunosuppression presents to the ER for evaluation of 48 hours of nausea vomiting crampy abdominal pain.  Still having output from ileostomy but has not been able to keep anything down.  No reported fevers.  Just traveled back from [place].  No known sick contacts there.  Denies any chest pain.  No history of CHF.     Physical Exam   Triage Vital Signs: ED Triage Vitals  Enc Vitals Group     BP 03/28/23 1818 (!) 79/49     Pulse Rate 03/28/23 1818 (!) 108     Resp 03/28/23 1818 18     Temp 03/28/23 1818 98.2 F (36.8 C)     Temp src --      SpO2 03/28/23 1818 98 %     Weight --      Height --      Head Circumference --      Peak Flow --      Pain Score 03/28/23 1817 10     Pain Loc --      Pain Edu? --      Excl. in GC? --     Most recent vital signs: Vitals:   03/28/23 1847 03/28/23 1900  BP: 133/78 (!) 107/51  Pulse:  86  Resp:  (!) 21  Temp:    SpO2:  100%     Constitutional: Alert, ill appearing Eyes: Conjunctivae are normal.  Head: Atraumatic. Nose: No congestion/rhinnorhea. Mouth/Throat: Mucous membranes are moist.   Neck: Painless ROM.  Cardiovascular:   Good peripheral circulation. Respiratory: Normal respiratory effort.  No retractions.  Gastrointestinal: Soft and nontender.  No guarding or rebound  Musculoskeletal:  no deformity Neurologic:  MAE spontaneously. No gross focal neurologic deficits are appreciated.  Skin:  Skin is warm, dry and intact. No rash noted. Psychiatric: Mood and affect are normal. Speech and behavior are normal.    ED Results / Procedures / Treatments   Labs (all labs ordered are listed, but only abnormal results are displayed) Labs  Reviewed  COMPREHENSIVE METABOLIC PANEL - Abnormal; Notable for the following components:      Result Value   CO2 16 (*)    Glucose, Bld 166 (*)    BUN 50 (*)    Creatinine, Ser 3.36 (*)    Total Protein 10.1 (*)    Albumin 5.1 (*)    GFR, Estimated 14 (*)    Anion gap 19 (*)    All other components within normal limits  CBC - Abnormal; Notable for the following components:   WBC 21.4 (*)    Hemoglobin 16.1 (*)    HCT 49.5 (*)    Platelets 451 (*)    All other components within normal limits  LACTIC ACID, PLASMA - Abnormal; Notable for the following components:   Lactic Acid, Venous 3.8 (*)    All other components within normal limits  GASTROINTESTINAL PANEL BY PCR, STOOL (REPLACES STOOL CULTURE)  C DIFFICILE QUICK SCREEN W PCR REFLEX    CULTURE, BLOOD (ROUTINE X 2)  CULTURE, BLOOD (ROUTINE X 2)  LIPASE, BLOOD  URINALYSIS, ROUTINE W REFLEX MICROSCOPIC  LACTIC ACID, PLASMA     EKG  RADIOLOGY Please see ED Course for my review and interpretation.  I personally reviewed all radiographic images ordered to evaluate for the above acute complaints and reviewed radiology reports and findings.  These findings were personally discussed with the patient.  Please see medical record for radiology report.    PROCEDURES:  Critical Care performed: Yes, see critical care procedure note(s)  .Critical Care  Performed by: Willy Eddy, MD Authorized by: Willy Eddy, MD   Critical care provider statement:    Critical care time (minutes):  35   Critical care was necessary to treat or prevent imminent or life-threatening deterioration of the following conditions:  Dehydration and circulatory failure   Critical care was time spent personally by me on the following activities:  Ordering and performing treatments and interventions, ordering and review of laboratory studies, ordering and review of radiographic studies, pulse oximetry, re-evaluation of patient's condition, review  of old charts, obtaining history from patient or surrogate, examination of patient, evaluation of patient's response to treatment, discussions with primary provider, discussions with consultants and development of treatment plan with patient or surrogate    MEDICATIONS ORDERED IN ED: Medications  morphine (PF) 4 MG/ML injection 4 mg (4 mg Intravenous Given 03/28/23 1938)  metroNIDAZOLE (FLAGYL) IVPB 500 mg (has no administration in time range)  lactated ringers infusion (has no administration in time range)  lactated ringers bolus 1,000 mL (1,000 mLs Intravenous New Bag/Given 03/28/23 1837)  ondansetron (ZOFRAN) injection 4 mg (4 mg Intravenous Given 03/28/23 1837)  sodium chloride 0.9 % bolus 1,000 mL (1,000 mLs Intravenous New Bag/Given 03/28/23 2135)  cefTRIAXone (ROCEPHIN) 2 g in sodium chloride 0.9 % 100 mL IVPB (2 g Intravenous New Bag/Given 03/28/23 2135)     IMPRESSION / MDM / ASSESSMENT AND PLAN / ED COURSE  I reviewed the triage vital signs and the nursing notes.                              Differential diagnosis includes, but is not limited to, dehydration, AKI, stone, SBO, perforation, enteritis, biliary pathology, pancreatitis  Patient presenting to the ER for evaluation of symptoms as described above.  Based on symptoms, risk factors and considered above differential, this presenting complaint could reflect a potentially life-threatening illness therefore the patient will be placed on continuous pulse oximetry and telemetry for monitoring.  Laboratory evaluation will be sent to evaluate for the above complaints.  Will order CT imaging.  Will give IV fluids for volume resuscitation.  Will give IV morphine for pain.   Clinical Course as of 03/28/23 2215  Mon Mar 28, 2023  1931 CT imaging on my review and interpretation without evidence of perforation or SBO will await formal radiology report. [PR]  1952 Patient feels somewhat improved after IV fluids.  Given report of diarrheal  illness will send stool for studies.  [PR]  2028 His lactate is elevated at 3.8.  With her episode of hypotension and leukocytosis abdominal pain will cover with antibiotics given concern for sepsis.  Awaiting urinalysis as well as right upper quadrant ultrasound. [PR]  2213 Right upper quadrant ultrasound without evidence of acute cholecystitis.  Patient does appear improved after IV fluids.  Suspect large component significant dehydration.  Based on presentation will consult hospitalist for admission. [PR]    Clinical Course User Index [PR] Willy Eddy, MD     FINAL CLINICAL IMPRESSION(S) / ED DIAGNOSES   Final diagnoses:  Sepsis with acute  renal failure without septic shock, due to unspecified organism, unspecified acute renal failure type (HCC)     Rx / DC Orders   ED Discharge Orders     None        Note:  This document was prepared using Dragon voice recognition software and may include unintentional dictation errors.    Willy Eddy, MD 03/28/23 2215

## 2023-03-28 NOTE — Assessment & Plan Note (Signed)
Associated with sepsis, associated with AKI and lactic acidosis.  Patient has received 2 L of fluid already, blood pressure has improved.  Will continue with LR at 125 an hour, follow-up blood cultures as well as stool microbiologic studies.  S/p ceftriaxone and metronidazole, continue with same.  Patient had hypotension on presentation at that that has resolved with IV fluids.  We will trend lactic acid level.

## 2023-03-28 NOTE — H&P (Signed)
History and Physical    Patient: [name] ZOX:[id number] DOB: Feb 09, 1951 DOA: 03/28/2023 DOS: the patient was seen and examined on 03/28/2023 PCP: Melida Quitter, PA  Patient coming from: Home  Chief Complaint:  Chief Complaint  Patient presents with   Abdominal Pain   HPI: [name] is a 72 y.o. female with medical history significant of ulcerative colitis, colectomy in 2009 with ileostomy creation.  Patient does not report any chronic medication use and reports well-controlled symptoms of ulcerative colitis in the last several years.   Patient is a very poor historian as many of the details of the history are given by her son-in-law at the bedside.  I also son-in-law was not with the patient prior to yesterday afternoon therefore many of patient's symptoms since that time cannot be ascertained with 100% reliability.Patient was in her usual state of health till yesterday morning when she started having new fatigue and poor appetite.  Subsequently patient reports having nausea as well as retching yesterday afternoon with new onset of multiple bowel movements via her ileostomy which became liquid which is unusual for the patient.  Required emptying 4 times yesterday and today.  Output was brown in color.  Associated with crampy abdominal pain.  Starting today patient was very intolerant of food and vomited at least 4 times any attempt to ingest fluids.  There is no bleeding reported.  No fever.  No new rigors or chills.  Patient became extremely weak today and had to be helped to be ambulated.  Finally brought to the ER.  ER evaluation is notable for the patient being hypotensive with good response to IV fluids.  Patient is now feeling "better "in terms of her pain being relieved.  However she does not have any appetite still. Patient is mixing up her days during provision of history . Being corrected by son in law often.  Incidentaly patient reports onset of tremor of hand in  right about 6 months ago with progresion to left hand and now some left leg as well.  Review of Systems: As mentioned in the history of present illness. All other systems reviewed and are negative. However, I still feel that patinet Is not 100% reliable in hisotyr.    Past Medical History:  Diagnosis Date   Anorexia    Ulcerative colitis    Past Surgical History:  Procedure Laterality Date   CESAREAN SECTION     COLECTOMY     ILEOSTOMY  5/09   Dr.Ingram --drained abscess and ileostomy   Social History:  reports that she has never smoked. She has never used smokeless tobacco. She reports that she does not drink alcohol and does not use drugs.  Allergies  Allergen Reactions   Mesalamine     REACTION: N/V, rectal bleeding    Family History  Problem Relation Age of Onset   Colitis Sister    Crohn's disease Sister    Stroke Father    Hypertension Father    Hyperlipidemia Father    Stroke Brother    Hypertension Brother    Hyperlipidemia Brother    Hypertension Paternal Grandmother    Stroke Paternal Grandmother     Prior to Admission medications   Medication Sig Start Date End Date Taking? Authorizing Provider  betamethasone valerate lotion (VALISONE) 0.1 % Apply 1 application topically 2 (two) times daily. Patient not taking: Reported on 10/29/2021 08/27/21   Mayer Masker, PA-C  Takes no meds at home  Physical Exam: Vitals:  03/28/23 1818 03/28/23 1847 03/28/23 1900  BP: (!) 79/49 133/78 (!) 107/51  Pulse: (!) 108  86  Resp: 18  (!) 21  Temp: 98.2 F (36.8 C)    SpO2: 98%  100%   General - alert awake. No distress. Although oriented to day, location. Has trouble giving coherent account of timeline of her symptoms. Resp - b/l a.e. vesicular Cvs-s1s2 normal Abdomen - soft non tender. Ileostomy in place with bag Extremity - warm without edema.  Patient has a resting classic pill-rolling tremor of the right upper extremity, less visible tremor of the left upper  extremity as well as left lower extremity. She has the classic cogwheel rigidity.  Further evaluation with gait could not be performed as patient is still having vital instability. Data Reviewed:  Labs on Admission:  Results for orders placed or performed during the hospital encounter of 03/28/23 (from the past 24 hour(s))  Lipase, blood     Status: None   Collection Time: 03/28/23  6:19 PM  Result Value Ref Range   Lipase 29 11 - 51 U/L  Comprehensive metabolic panel     Status: Abnormal   Collection Time: 03/28/23  6:19 PM  Result Value Ref Range   Sodium 136 135 - 145 mmol/L   Potassium 4.7 3.5 - 5.1 mmol/L   Chloride 101 98 - 111 mmol/L   CO2 16 (L) 22 - 32 mmol/L   Glucose, Bld 166 (H) 70 - 99 mg/dL   BUN 50 (H) 8 - 23 mg/dL   Creatinine, Ser 1.61 (H) 0.44 - 1.00 mg/dL   Calcium 09.6 8.9 - 04.5 mg/dL   Total Protein 40.9 (H) 6.5 - 8.1 g/dL   Albumin 5.1 (H) 3.5 - 5.0 g/dL   AST 32 15 - 41 U/L   ALT 18 0 - 44 U/L   Alkaline Phosphatase 77 38 - 126 U/L   Total Bilirubin 0.7 0.3 - 1.2 mg/dL   GFR, Estimated 14 (L) >60 mL/min   Anion gap 19 (H) 5 - 15  CBC     Status: Abnormal   Collection Time: 03/28/23  6:19 PM  Result Value Ref Range   WBC 21.4 (H) 4.0 - 10.5 K/uL   RBC 5.11 3.87 - 5.11 MIL/uL   Hemoglobin 16.1 (H) 12.0 - 15.0 g/dL   HCT 81.1 (H) 91.4 - 78.2 %   MCV 96.9 80.0 - 100.0 fL   MCH 31.5 26.0 - 34.0 pg   MCHC 32.5 30.0 - 36.0 g/dL   RDW 95.6 21.3 - 08.6 %   Platelets 451 (H) 150 - 400 K/uL   nRBC 0.0 0.0 - 0.2 %  Lactic acid, plasma     Status: Abnormal   Collection Time: 03/28/23  7:21 PM  Result Value Ref Range   Lactic Acid, Venous 3.8 (HH) 0.5 - 1.9 mmol/L     Radiological Exams on Admission:  US ABDOMEN LIMITED RUQ (LIVER/GB)  Result Date: 03/28/2023 CLINICAL DATA:  [id number] Nausea AND vomiting [id number] EXAM: ULTRASOUND ABDOMEN LIMITED RIGHT UPPER QUADRANT COMPARISON:  CT abdomen pelvis 03/28/2023 FINDINGS: Gallbladder: Calcified gallstones within  the gallbladder lumen. No gallbladder wall thickening visualized. No sonographic Murphy sign noted by sonographer. Common bile duct: Diameter: 7mm. Liver: No focal lesion identified. Within normal limits in parenchymal echogenicity. Portal vein is patent on color Doppler imaging with normal direction of blood flow towards the liver. Other: None. IMPRESSION: Cholelithiasis with no acute cholecystitis. Electronically Signed   By: Normajean Glasgow.D.  On: 03/28/2023 21:54   CT ABDOMEN PELVIS WO CONTRAST  Result Date: 03/28/2023 CLINICAL DATA:  Abdominal pain EXAM: CT ABDOMEN AND PELVIS WITHOUT CONTRAST TECHNIQUE: Multidetector CT imaging of the abdomen and pelvis was performed following the standard protocol without IV contrast. RADIATION DOSE REDUCTION: This exam was performed according to the departmental dose-optimization program which includes automated exposure control, adjustment of the mA and/or kV according to patient size and/or use of iterative reconstruction technique. COMPARISON:  CT abdomen and pelvis 11/09/2010 FINDINGS: Lower chest: No acute abnormality. Hepatobiliary: Gallstones are present. No biliary ductal dilatation. Liver is within normal limits. Pancreas: Unremarkable. No pancreatic ductal dilatation or surrounding inflammatory changes. Spleen: Normal in size without focal abnormality. Adrenals/Urinary Tract: Bilateral renal cysts are present. The largest is in the right kidney measuring 2.6 cm. Mildly hyperdense area in the inferior pole the left kidney measures 1 cm, unchanged. There is also mildly hyperdense area in the medial left kidney measuring 17 mm, unchanged. Punctate renal calculi are seen. There is no hydronephrosis. Adrenal glands and bladder are within normal limits. Stomach/Bowel: Again seen are changes of colectomy with right lower quadrant ileostomy. Peristomal hernia contains nondilated small bowel. There is no evidence for bowel obstruction, focal wall inflammation,  pneumatosis or free air. The stomach is within normal limits. Vascular/Lymphatic: Aortic atherosclerosis. No enlarged abdominal or pelvic lymph nodes. Reproductive: Uterus and bilateral adnexa are unremarkable. Other: No ascites. Musculoskeletal: No acute osseous finding. Degenerative changes affect the hips. IMPRESSION: 1. No acute localizing process in the abdomen or pelvis. 2. Cholelithiasis. 3. Nonobstructing bilateral renal calculi. 4. Stable changes of colectomy with right lower quadrant ileostomy. Peristomal hernia contains nondilated small bowel. 5. Bilateral renal cysts. There are 2 mildly hyperdense lesions in the left kidney which are indeterminate, but favored as benign proteinaceous cyst given stability since 2011. No follow-up necessary. Aortic Atherosclerosis (ICD10-I70.0). Electronically Signed   By: Darliss Cheney M.D.   On: 03/28/2023 19:35    EKG: pending.   Assessment and Plan: * Gastroenteritis Associated with sepsis, associated with AKI and lactic acidosis.  Patient has received 2 L of fluid already, blood pressure has improved.  Will continue with LR at 125 an hour, follow-up blood cultures as well as stool microbiologic studies.  S/p ceftriaxone and metronidazole, continue with same.  Patient had hypotension on presentation at that that has resolved with IV fluids.  We will trend lactic acid level.  Parkinsonism Incidentally noted in this encounter. However, evaluation was limited as patient not ambulated and bradykinesia not documented. Once patient improved from acute illness, this can be evaluated further with possible trial of sinemet. And documentation of response.      Advance Care Planning:   Code Status: Full Code   Consults: none  Family Communication: son in law at bedside.  Severity of Illness: The appropriate patient status for this patient is INPATIENT. Inpatient status is judged to be reasonable and necessary in order to provide the required intensity of  service to ensure the patient's safety. The patient's presenting symptoms, physical exam findings, and initial radiographic and laboratory data in the context of their chronic comorbidities is felt to place them at high risk for further clinical deterioration. Furthermore, it is not anticipated that the patient will be medically stable for discharge from the hospital within 2 midnights of admission.   * I certify that at the point of admission it is my clinical judgment that the patient will require inpatient hospital care spanning beyond 2 midnights from  the point of admission due to high intensity of service, high risk for further deterioration and high frequency of surveillance required.*  Author: Nolberto Hanlon, MD 03/28/2023 10:46 PM  For on call review [email or web address].

## 2023-03-28 NOTE — Assessment & Plan Note (Signed)
Incidentally noted in this encounter. However, evaluation was limited as patient not ambulated and bradykinesia not documented. Once patient improved from acute illness, this can be evaluated further with possible trial of sinemet. And documentation of response.

## 2023-03-28 NOTE — ED Notes (Signed)
BP rechecked and is 106/62. Pt appears a little pale and appears in pain. Pt shaking and states nausea.

## 2023-03-29 ENCOUNTER — Encounter: Payer: Self-pay | Admitting: Internal Medicine

## 2023-03-29 LAB — CULTURE, BLOOD (ROUTINE X 2)

## 2023-03-29 LAB — TROPONIN I (HIGH SENSITIVITY): Troponin I (High Sensitivity): 12 ng/L (ref <18)

## 2023-03-29 LAB — PROTIME-INR
INR: 1.1 (ref 0.8–1.2)
Prothrombin Time: 14.4 seconds (ref 11.4–15.2)

## 2023-03-29 LAB — APTT: aPTT: 29 seconds (ref 24–36)

## 2023-03-29 LAB — VITAMIN B12: Vitamin B-12: 2163 pg/mL — ABNORMAL HIGH (ref 180–914)

## 2023-03-29 LAB — BASIC METABOLIC PANEL
Anion gap: 10 (ref 5–15)
BUN: 43 mg/dL — ABNORMAL HIGH (ref 8–23)
CO2: 19 mmol/L — ABNORMAL LOW (ref 22–32)
Calcium: 8.8 mg/dL — ABNORMAL LOW (ref 8.9–10.3)
Chloride: 108 mmol/L (ref 98–111)
Creatinine, Ser: 1.7 mg/dL — ABNORMAL HIGH (ref 0.44–1.00)
GFR, Estimated: 32 mL/min — ABNORMAL LOW (ref >60)
Glucose, Bld: 112 mg/dL — ABNORMAL HIGH (ref 70–99)
Potassium: 3.7 mmol/L (ref 3.5–5.1)
Sodium: 137 mmol/L (ref 135–145)

## 2023-03-29 LAB — C DIFFICILE QUICK SCREEN W PCR REFLEX
C Diff antigen: NEGATIVE
C Diff interpretation: NOT DETECTED
C Diff toxin: NEGATIVE

## 2023-03-29 LAB — CBC
HCT: 35.3 % — ABNORMAL LOW (ref 36.0–46.0)
Hemoglobin: 11.8 g/dL — ABNORMAL LOW (ref 12.0–15.0)
MCH: 31.9 pg (ref 26.0–34.0)
MCHC: 33.4 g/dL (ref 30.0–36.0)
MCV: 95.4 fL (ref 80.0–100.0)
Platelets: 345 10*3/uL (ref 150–400)
RBC: 3.7 MIL/uL — ABNORMAL LOW (ref 3.87–5.11)
RDW: 13.2 % (ref 11.5–15.5)
WBC: 16.5 10*3/uL — ABNORMAL HIGH (ref 4.0–10.5)
nRBC: 0 % (ref 0.0–0.2)

## 2023-03-29 MED ORDER — ENOXAPARIN SODIUM 30 MG/0.3ML IJ SOSY
30.0000 mg | PREFILLED_SYRINGE | INTRAMUSCULAR | Status: DC
  Filled 2023-03-29: qty 0.3

## 2023-03-29 MED ORDER — METRONIDAZOLE 500 MG/100ML IV SOLN
500.0000 mg | Freq: Two times a day (BID) | INTRAVENOUS | Status: DC
  Filled 2023-03-29: qty 100

## 2023-03-29 MED ORDER — ACETAMINOPHEN 650 MG RE SUPP: 650.0000 mg | Freq: Four times a day (QID) | RECTAL | Status: DC | PRN

## 2023-03-29 MED ORDER — SODIUM CHLORIDE 0.9 % IV SOLN: 2.0000 g | INTRAVENOUS | Status: DC

## 2023-03-29 MED ORDER — SODIUM CHLORIDE 0.9% FLUSH
3.0000 mL | Freq: Two times a day (BID) | INTRAVENOUS | Status: DC
  Administered 2023-03-29 – 2023-03-30 (×2): 3 mL via INTRAVENOUS

## 2023-03-29 MED ORDER — ACETAMINOPHEN 325 MG PO TABS: 650.0000 mg | ORAL_TABLET | Freq: Four times a day (QID) | ORAL | Status: DC | PRN

## 2023-03-29 MED ORDER — ONDANSETRON 4 MG PO TBDP
4.0000 mg | ORAL_TABLET | Freq: Four times a day (QID) | ORAL | Status: DC | PRN
  Administered 2023-03-29 (×2): 4 mg via ORAL
  Filled 2023-03-29 (×2): qty 1

## 2023-03-29 MED ORDER — LOPERAMIDE HCL 2 MG PO CAPS
2.0000 mg | ORAL_CAPSULE | Freq: Three times a day (TID) | ORAL | Status: DC
  Administered 2023-03-29 (×3): 2 mg via ORAL
  Filled 2023-03-29 (×3): qty 1

## 2023-03-29 NOTE — Progress Notes (Signed)
PROGRESS NOTE    [name]  WNU:[id number] DOB: 07-20-1951 DOA: 03/28/2023 PCP: Melida Quitter, PA     Brief Narrative:   From admission h and [facility] [place] CHELBY [name] is a 72 y.o. female with medical history significant of ulcerative colitis, colectomy in 2009 with ileostomy creation.  Patient does not report any chronic medication use and reports well-controlled symptoms of ulcerative colitis in the last several years.   Patient is a very poor historian as many of the details of the history are given by her son-in-law at the bedside.  I also son-in-law was not with the patient prior to yesterday afternoon therefore many of patient's symptoms since that time cannot be ascertained with 100% reliability.Patient was in her usual state of health till yesterday morning when she started having new fatigue and poor appetite.  Subsequently patient reports having nausea as well as retching yesterday afternoon with new onset of multiple bowel movements via her ileostomy which became liquid which is unusual for the patient.  Required emptying 4 times yesterday and today.  Output was brown in color.  Associated with crampy abdominal pain.  Starting today patient was very intolerant of food and vomited at least 4 times any attempt to ingest fluids.  There is no bleeding reported.  No fever.  No new rigors or chills.  Patient became extremely weak today and had to be helped to be ambulated.  Finally brought to the ER.   ER evaluation is notable for the patient being hypotensive with good response to IV fluids.   Patient is now feeling "better "in terms of her pain being relieved.  However she does not have any appetite still. Patient is mixing up her days during provision of history . Being corrected by son in law often.   Assessment & Plan:   Principal Problem:   Gastroenteritis Active Problems:   SLEEP APNEA, OBSTRUCTIVE   Ileostomy status (HCC)   AKI (acute kidney injury) (HCC)    Parkinsonism  # Rotavirus gastroenteritis Just returned from visiting new grandson who has diarrheal illness, likely the same. C diff negative. Vomiting resolved but still nauseaus. Ileostomy output still high. Aki improving. Doesn't feel well enough to go home today - continue fluids - zofran prn - will start lopermaide - likely d/c tomorrow - d/c abx  # AKI 2/2 above. Normal baseline, 3.36 on admission, improved to 1.7 with fluids - continue fluids  # Acute metabolic encephalopathy 2/2 the above. Resolved  # Metabolic acidosis 2/2 the above. Improving - continue fluids  # Ileostomy Ostomy nurse consult orders are in     DVT prophylaxis: SCDs, declines pharmacologic Code Status: full Family Communication: husband updated @ bedside 4/30  Level of care: Telemetry Medical Status is: Inpatient Remains inpatient appropriate because: need for ongoing IV hydration    Consultants:  none  Procedures: none  Antimicrobials:  discontinuing    Subjective: Nausea improving, tolerating a bit of food, output still high, no vomiting  Objective: Vitals:   03/29/23 0058 03/29/23 0438 03/29/23 0448 03/29/23 0758  BP: (!) 109/56 (!) 99/46 (!) 103/49 (!) 93/54  Pulse: 85 80  76  Resp: 18 18  16   Temp: 97.9 F (36.6 C) 98.3 F (36.8 C)  97.9 F (36.6 C)  TempSrc: Oral Oral  Oral  SpO2: 100% 97%  98%  Weight:  48.1 kg    Height:  5' (1.524 m)      Intake/Output Summary (Last 24 hours) at 03/29/2023 [phone]  Last data filed at 03/29/2023 0600 Gross per 24 hour  Intake 1957.33 ml  Output 1100 ml  Net 857.33 ml   Filed Weights   03/29/23 0438  Weight: 48.1 kg    Examination:  General exam: Appears calm and comfortable  Respiratory system: Clear to auscultation. Respiratory effort normal. Cardiovascular system: S1 & S2 heard, RRR. No JVD, murmurs, rubs, gallops or clicks. No pedal edema. Gastrointestinal system: Abdomen is nondistended, soft and nontender. No  organomegaly or masses felt. Normal bowel sounds heard. Ostomy bag in place Central nervous system: Alert and oriented. No focal neurological deficits. Extremities: Symmetric 5 x 5 power. Skin: No rashes, lesions or ulcers Psychiatry: Judgement and insight appear normal. Mood & affect appropriate.     Data Reviewed: I have personally reviewed following labs and imaging studies  CBC: Recent Labs  Lab 03/28/23 1819 03/29/23 0220  WBC 21.4* 16.5*  HGB 16.1* 11.8*  HCT 49.5* 35.3*  MCV 96.9 95.4  PLT 451* 345   Basic Metabolic Panel: Recent Labs  Lab 03/28/23 1819 03/29/23 0220  NA 136 137  K 4.7 3.7  CL 101 108  CO2 16* 19*  GLUCOSE 166* 112*  BUN 50* 43*  CREATININE 3.36* 1.70*  CALCIUM 10.2 8.8*   GFR: Estimated Creatinine Clearance: 21.8 mL/min (A) (by C-G formula based on SCr of 1.7 mg/dL (H)). Liver Function Tests: Recent Labs  Lab 03/28/23 1819  AST 32  ALT 18  ALKPHOS 77  BILITOT 0.7  PROT 10.1*  ALBUMIN 5.1*   Recent Labs  Lab 03/28/23 1819  LIPASE 29   No results for input(s): "AMMONIA" in the last 168 hours. Coagulation Profile: Recent Labs  Lab 03/29/23 0220  INR 1.1   Cardiac Enzymes: No results for input(s): "CKTOTAL", "CKMB", "CKMBINDEX", "TROPONINI" in the last 168 hours. BNP (last 3 results) No results for input(s): "PROBNP" in the last 8760 hours. HbA1C: No results for input(s): "HGBA1C" in the last 72 hours. CBG: No results for input(s): "GLUCAP" in the last 168 hours. Lipid Profile: No results for input(s): "CHOL", "HDL", "LDLCALC", "TRIG", "CHOLHDL", "LDLDIRECT" in the last 72 hours. Thyroid Function Tests: No results for input(s): "TSH", "T4TOTAL", "FREET4", "T3FREE", "THYROIDAB" in the last 72 hours. Anemia Panel: Recent Labs    03/29/23 0220  VITAMINB12 2,163*   Urine analysis:    Component Value Date/Time   COLORURINE YELLOW (A) 03/28/2023 2225   APPEARANCEUR CLOUDY (A) 03/28/2023 2225   LABSPEC 1.014 03/28/2023  2225   PHURINE 5.0 03/28/2023 2225   GLUCOSEU NEGATIVE 03/28/2023 2225   HGBUR SMALL (A) 03/28/2023 2225   HGBUR small 10/07/2009 1123   BILIRUBINUR NEGATIVE 03/28/2023 2225   BILIRUBINUR neg 07/19/2011 1152   KETONESUR NEGATIVE 03/28/2023 2225   PROTEINUR 30 (A) 03/28/2023 2225   UROBILINOGEN 0.2 07/19/2011 1152   UROBILINOGEN 0.2 11/09/2010 1757   NITRITE NEGATIVE 03/28/2023 2225   LEUKOCYTESUR NEGATIVE 03/28/2023 2225   Sepsis Labs: @LABRCNTIP (procalcitonin:4,lacticidven:4)  ) Recent Results (from the past 240 hour(s))  C Difficile Quick Screen w PCR reflex     Status: None   Collection Time: 03/28/23  9:05 PM   Specimen: Stool  Result Value Ref Range Status   C Diff antigen NEGATIVE NEGATIVE Final   C Diff toxin NEGATIVE NEGATIVE Final   C Diff interpretation No C. difficile detected.  Final    Comment: Performed at [facility], [address] [place], [place] [address]  Gastrointestinal Panel by PCR , Stool     Status:  Abnormal   Collection Time: 03/28/23  9:07 PM   Specimen: Stool  Result Value Ref Range Status   Campylobacter species NOT DETECTED NOT DETECTED Final   Plesimonas shigelloides NOT DETECTED NOT DETECTED Final   Salmonella species NOT DETECTED NOT DETECTED Final   Yersinia enterocolitica NOT DETECTED NOT DETECTED Final   Vibrio species NOT DETECTED NOT DETECTED Final   Vibrio cholerae NOT DETECTED NOT DETECTED Final   Enteroaggregative E coli (EAEC) NOT DETECTED NOT DETECTED Final   Enteropathogenic E coli (EPEC) NOT DETECTED NOT DETECTED Final   Enterotoxigenic E coli (ETEC) NOT DETECTED NOT DETECTED Final   Shiga like toxin producing E coli (STEC) NOT DETECTED NOT DETECTED Final   Shigella/Enteroinvasive E coli (EIEC) NOT DETECTED NOT DETECTED Final   Cryptosporidium NOT DETECTED NOT DETECTED Final   Cyclospora cayetanensis NOT DETECTED NOT DETECTED Final   Entamoeba histolytica NOT DETECTED NOT DETECTED Final   Giardia lamblia NOT  DETECTED NOT DETECTED Final   Adenovirus F40/41 NOT DETECTED NOT DETECTED Final   Astrovirus NOT DETECTED NOT DETECTED Final   Norovirus GI/GII NOT DETECTED NOT DETECTED Final   Rotavirus A DETECTED (A) NOT DETECTED Final   Sapovirus (I, II, IV, and V) NOT DETECTED NOT DETECTED Final    Comment: Performed at [facility], [address] [place], [place] [address]  Blood Culture (routine x 2)     Status: None (Preliminary result)   Collection Time: 03/28/23  9:07 PM   Specimen: BLOOD  Result Value Ref Range Status   Specimen Description BLOOD BLOOD RIGHT ARM  Final   Special Requests   Final    BOTTLES DRAWN AEROBIC AND ANAEROBIC Blood Culture adequate volume   Culture   Final    NO GROWTH < 12 HOURS Performed at [facility], [address] [place], [place] [address]    Report Status PENDING  Incomplete  Blood Culture (routine x 2)     Status: None (Preliminary result)   Collection Time: 03/28/23  9:07 PM   Specimen: BLOOD  Result Value Ref Range Status   Specimen Description BLOOD BLOOD LEFT ARM  Final   Special Requests   Final    BOTTLES DRAWN AEROBIC AND ANAEROBIC Blood Culture adequate volume   Culture   Final    NO GROWTH < 12 HOURS Performed at [facility], [address], [place], [place] [address]    Report Status PENDING  Incomplete         Radiology Studies: US ABDOMEN LIMITED RUQ (LIVER/GB)  Result Date: 03/28/2023 CLINICAL DATA:  [id number] Nausea AND vomiting [id number] EXAM: ULTRASOUND ABDOMEN LIMITED RIGHT UPPER QUADRANT COMPARISON:  CT abdomen pelvis 03/28/2023 FINDINGS: Gallbladder: Calcified gallstones within the gallbladder lumen. No gallbladder wall thickening visualized. No sonographic Murphy sign noted by sonographer. Common bile duct: Diameter: 7mm. Liver: No focal lesion identified. Within normal limits in parenchymal echogenicity. Portal vein is patent on color Doppler imaging with normal direction of blood flow towards the  liver. Other: None. IMPRESSION: Cholelithiasis with no acute cholecystitis. Electronically Signed   By: Tish Frederickson M.D.   On: 03/28/2023 21:54   CT ABDOMEN PELVIS WO CONTRAST  Result Date: 03/28/2023 CLINICAL DATA:  Abdominal pain EXAM: CT ABDOMEN AND PELVIS WITHOUT CONTRAST TECHNIQUE: Multidetector CT imaging of the abdomen and pelvis was performed following the standard protocol without IV contrast. RADIATION DOSE REDUCTION: This exam was performed according to the departmental dose-optimization program which includes automated exposure control, adjustment of the mA  and/or kV according to patient size and/or use of iterative reconstruction technique. COMPARISON:  CT abdomen and pelvis 11/09/2010 FINDINGS: Lower chest: No acute abnormality. Hepatobiliary: Gallstones are present. No biliary ductal dilatation. Liver is within normal limits. Pancreas: Unremarkable. No pancreatic ductal dilatation or surrounding inflammatory changes. Spleen: Normal in size without focal abnormality. Adrenals/Urinary Tract: Bilateral renal cysts are present. The largest is in the right kidney measuring 2.6 cm. Mildly hyperdense area in the inferior pole the left kidney measures 1 cm, unchanged. There is also mildly hyperdense area in the medial left kidney measuring 17 mm, unchanged. Punctate renal calculi are seen. There is no hydronephrosis. Adrenal glands and bladder are within normal limits. Stomach/Bowel: Again seen are changes of colectomy with right lower quadrant ileostomy. Peristomal hernia contains nondilated small bowel. There is no evidence for bowel obstruction, focal wall inflammation, pneumatosis or free air. The stomach is within normal limits. Vascular/Lymphatic: Aortic atherosclerosis. No enlarged abdominal or pelvic lymph nodes. Reproductive: Uterus and bilateral adnexa are unremarkable. Other: No ascites. Musculoskeletal: No acute osseous finding. Degenerative changes affect the hips. IMPRESSION: 1. No acute  localizing process in the abdomen or pelvis. 2. Cholelithiasis. 3. Nonobstructing bilateral renal calculi. 4. Stable changes of colectomy with right lower quadrant ileostomy. Peristomal hernia contains nondilated small bowel. 5. Bilateral renal cysts. There are 2 mildly hyperdense lesions in the left kidney which are indeterminate, but favored as benign proteinaceous cyst given stability since 2011. No follow-up necessary. Aortic Atherosclerosis (ICD10-I70.0). Electronically Signed   By: Darliss Cheney M.D.   On: 03/28/2023 19:35        Scheduled Meds:  enoxaparin (LOVENOX) injection  30 mg Subcutaneous Q24H   sodium chloride flush  3 mL Intravenous Q12H   Continuous Infusions:  cefTRIAXone (ROCEPHIN)  IV     lactated ringers 125 mL/hr at 03/29/23 0703   metronidazole       LOS: 1 day     Silvano Bilis, MD [facility]   If 7PM-7AM, please contact night-coverage [email or web address] Password [facility] 03/29/2023, 9:55 AM

## 2023-03-29 NOTE — Consult Note (Signed)
WOC Nurse ostomy consult note Stoma type/location: RUQ ileostomy. Consulted for supplies. Patient wears a 2-piece 2 and 1/4 inch ostomy pouching system by [employer] and prefers to use her own supplies while in house. She uses an opaque pouch with an integrated gas filter Customer service manager # [phone]) I have provided orders for staff to provide supplies in the event the patient's supply runs out and no one is available to bring additional supply to the hospital and also how to assist the patient in her care if she is unable to self manage her ostomy. Stomal assessment/size: Not measured today Peristomal assessment: Not seen today Treatment options for stomal/peristomal skin: recommend skin barrier ring, but this is not essential Output:stool Ostomy pouching: 2pc. 2 and 1/4 inch ostomy pouching system Education provided: None today Enrolled patient in [employer] DC program: No. Patient is established with a supplier for her ostomy supplies Customer service manager).  WOC nursing team will not follow, but will remain available to this patient, the nursing and medical teams.  Please re-consult if needed.  Thank you for inviting [place] to participate in this patient's Plan of Care.  Ladona Mow, MSN, RN, CNS, GNP, Leda Min, [employer], [employer] phone:  [phone]

## 2023-03-29 NOTE — Progress Notes (Signed)
  Transition of Care [facility]) Screening Note   Patient Details  Name: VIOLET SEABURY Date of Birth: December 14, 1950   Transition of [facility]) CM/SW Contact:    Chapman Fitch, RN Phone Number: 03/29/2023, 10:11 AM    Transition of Care Department [facility]) has reviewed patient and no TOC needs have been identified at this time. We will continue to monitor patient advancement through interdisciplinary progression rounds. If new patient transition needs arise, please place a TOC consult.

## 2023-03-30 DIAGNOSIS — K529 Noninfective gastroenteritis and colitis, unspecified: Secondary | ICD-10-CM | POA: Diagnosis not present

## 2023-03-30 DIAGNOSIS — N179 Acute kidney failure, unspecified: Secondary | ICD-10-CM

## 2023-03-30 DIAGNOSIS — G9341 Metabolic encephalopathy: Secondary | ICD-10-CM

## 2023-03-30 LAB — BASIC METABOLIC PANEL
Anion gap: 3 — ABNORMAL LOW (ref 5–15)
BUN: 19 mg/dL (ref 8–23)
CO2: 26 mmol/L (ref 22–32)
Calcium: 8.3 mg/dL — ABNORMAL LOW (ref 8.9–10.3)
Chloride: 110 mmol/L (ref 98–111)
Creatinine, Ser: 0.57 mg/dL (ref 0.44–1.00)
GFR, Estimated: >60 mL/min (ref >60)
Glucose, Bld: 93 mg/dL (ref 70–99)
Potassium: 3.5 mmol/L (ref 3.5–5.1)
Sodium: 139 mmol/L (ref 135–145)

## 2023-03-30 LAB — CULTURE, BLOOD (ROUTINE X 2): Culture: NO GROWTH

## 2023-03-30 MED ORDER — ONDANSETRON 4 MG PO TBDP: 4.0000 mg | ORAL_TABLET | Freq: Four times a day (QID) | ORAL | 0 refills | Status: AC | PRN

## 2023-03-30 MED ORDER — BETAMETHASONE VALERATE 0.1 % EX LOTN: 1.0000 | TOPICAL_LOTION | Freq: Two times a day (BID) | CUTANEOUS | 0 refills | Status: DC

## 2023-03-30 NOTE — Plan of Care (Signed)

## 2023-03-30 NOTE — Discharge Summary (Signed)
Physician Discharge Summary  [name] WUJ:[id number] DOB: May 06, 1951 DOA: 03/28/2023  PCP: Melida Quitter, PA  Admit date: 03/28/2023 Discharge date: 03/30/2023  Admitted From: home  Disposition:  home   Recommendations for Outpatient Follow-up:  Follow up with PCP in 1-2 weeks   Home Health: no  Equipment/Devices:  Discharge Condition: stable  CODE STATUS: full  Diet recommendation: Regular   Brief/Interim Summary: HPI was taken from Dr. Maryjean Ka: [name] is a 72 y.o. female with medical history significant of ulcerative colitis, colectomy in 2009 with ileostomy creation.  Patient does not report any chronic medication use and reports well-controlled symptoms of ulcerative colitis in the last several years.   Patient is a very poor historian as many of the details of the history are given by her son-in-law at the bedside.  I also son-in-law was not with the patient prior to yesterday afternoon therefore many of patient's symptoms since that time cannot be ascertained with 100% reliability.Patient was in her usual state of health till yesterday morning when she started having new fatigue and poor appetite.  Subsequently patient reports having nausea as well as retching yesterday afternoon with new onset of multiple bowel movements via her ileostomy which became liquid which is unusual for the patient.  Required emptying 4 times yesterday and today.  Output was brown in color.  Associated with crampy abdominal pain.  Starting today patient was very intolerant of food and vomited at least 4 times any attempt to ingest fluids.  There is no bleeding reported.  No fever.  No new rigors or chills.  Patient became extremely weak today and had to be helped to be ambulated.  Finally brought to the ER.   ER evaluation is notable for the patient being hypotensive with good response to IV fluids.   Patient is now feeling "better "in terms of her pain being relieved.  However she does  not have any appetite still. Patient is mixing up her days during provision of history . Being corrected by son in law often.   Incidentaly patient reports onset of tremor of hand in right about 6 months ago with progresion to left hand and now some left leg as well.     Discharge Diagnoses:  Principal Problem:   Gastroenteritis Active Problems:   SLEEP APNEA, OBSTRUCTIVE   Ileostomy status (HCC)   AKI (acute kidney injury) (HCC)   Parkinsonism  Rotavirus gastroenteritis: returned from visiting new grandson who has diarrheal illness, likely the same. C diff negative. Ileostomy output still high. Continue on IVFs. Zofran prn.    AKI: likely secondary to rotavirus. Resolved    Acute metabolic encephalopathy: likely secondary to infection. Resolved    Metabolic acidosis: resolved   Ileostomy: continue w/ supportive care  Discharge Instructions  Discharge Instructions     Diet general   Complete by: As directed    Discharge instructions   Complete by: As directed    F/u w/ PCP in 1-2 weeks   Increase activity slowly   Complete by: As directed       Allergies as of 03/30/2023       Reactions   Mesalamine    REACTION: N/V, rectal bleeding        Medication List     TAKE these medications    betamethasone valerate lotion 0.1 % Commonly known as: VALISONE Apply 1 Application topically 2 (two) times daily.   ondansetron 4 MG disintegrating tablet Commonly known as: ZOFRAN-ODT Take 1  tablet (4 mg total) by mouth every 6 (six) hours as needed for up to 14 days for nausea or vomiting.        Allergies  Allergen Reactions   Mesalamine     REACTION: N/V, rectal bleeding    Consultations:    Procedures/Studies: US ABDOMEN LIMITED RUQ (LIVER/GB)  Result Date: 03/28/2023 CLINICAL DATA:  [id number] Nausea AND vomiting [id number] EXAM: ULTRASOUND ABDOMEN LIMITED RIGHT UPPER QUADRANT COMPARISON:  CT abdomen pelvis 03/28/2023 FINDINGS: Gallbladder: Calcified gallstones  within the gallbladder lumen. No gallbladder wall thickening visualized. No sonographic Murphy sign noted by sonographer. Common bile duct: Diameter: 7mm. Liver: No focal lesion identified. Within normal limits in parenchymal echogenicity. Portal vein is patent on color Doppler imaging with normal direction of blood flow towards the liver. Other: None. IMPRESSION: Cholelithiasis with no acute cholecystitis. Electronically Signed   By: Tish Frederickson M.D.   On: 03/28/2023 21:54   CT ABDOMEN PELVIS WO CONTRAST  Result Date: 03/28/2023 CLINICAL DATA:  Abdominal pain EXAM: CT ABDOMEN AND PELVIS WITHOUT CONTRAST TECHNIQUE: Multidetector CT imaging of the abdomen and pelvis was performed following the standard protocol without IV contrast. RADIATION DOSE REDUCTION: This exam was performed according to the departmental dose-optimization program which includes automated exposure control, adjustment of the mA and/or kV according to patient size and/or use of iterative reconstruction technique. COMPARISON:  CT abdomen and pelvis 11/09/2010 FINDINGS: Lower chest: No acute abnormality. Hepatobiliary: Gallstones are present. No biliary ductal dilatation. Liver is within normal limits. Pancreas: Unremarkable. No pancreatic ductal dilatation or surrounding inflammatory changes. Spleen: Normal in size without focal abnormality. Adrenals/Urinary Tract: Bilateral renal cysts are present. The largest is in the right kidney measuring 2.6 cm. Mildly hyperdense area in the inferior pole the left kidney measures 1 cm, unchanged. There is also mildly hyperdense area in the medial left kidney measuring 17 mm, unchanged. Punctate renal calculi are seen. There is no hydronephrosis. Adrenal glands and bladder are within normal limits. Stomach/Bowel: Again seen are changes of colectomy with right lower quadrant ileostomy. Peristomal hernia contains nondilated small bowel. There is no evidence for bowel obstruction, focal wall inflammation,  pneumatosis or free air. The stomach is within normal limits. Vascular/Lymphatic: Aortic atherosclerosis. No enlarged abdominal or pelvic lymph nodes. Reproductive: Uterus and bilateral adnexa are unremarkable. Other: No ascites. Musculoskeletal: No acute osseous finding. Degenerative changes affect the hips. IMPRESSION: 1. No acute localizing process in the abdomen or pelvis. 2. Cholelithiasis. 3. Nonobstructing bilateral renal calculi. 4. Stable changes of colectomy with right lower quadrant ileostomy. Peristomal hernia contains nondilated small bowel. 5. Bilateral renal cysts. There are 2 mildly hyperdense lesions in the left kidney which are indeterminate, but favored as benign proteinaceous cyst given stability since 2011. No follow-up necessary. Aortic Atherosclerosis (ICD10-I70.0). Electronically Signed   By: Darliss Cheney M.D.   On: 03/28/2023 19:35   (Echo, Carotid, EGD, Colonoscopy, ERCP)    Subjective: Pt c/o fatigue    Discharge Exam: Vitals:   03/30/23 0501 03/30/23 0757  BP: (!) 92/54 (!) 103/57  Pulse: 68 66  Resp: 18 14  Temp: 98.4 F (36.9 C)   SpO2: 98% 99%   Vitals:   03/29/23 1627 03/29/23 1948 03/30/23 0501 03/30/23 0757  BP:  101/62 (!) 92/54 (!) 103/57  Pulse: 74 79 68 66  Resp:  20 18 14   Temp:  98.4 F (36.9 C) 98.4 F (36.9 C)   TempSrc:  Oral Oral   SpO2: 99% 98% 98% 99%  Weight:  Height:        General: Pt is alert, awake, not in acute distress Cardiovascular: S1/S2 +, no rubs, no gallops Respiratory: CTA bilaterally, no wheezing, no rhonchi Abdominal: Soft, NT, ND, bowel sounds + Extremities: no edema, no cyanosis    The results of significant diagnostics from this hospitalization (including imaging, microbiology, ancillary and laboratory) are listed below for reference.     Microbiology: Recent Results (from the past 240 hour(s))  C Difficile Quick Screen w PCR reflex     Status: None   Collection Time: 03/28/23  9:05 PM   Specimen:  Stool  Result Value Ref Range Status   C Diff antigen NEGATIVE NEGATIVE Final   C Diff toxin NEGATIVE NEGATIVE Final   C Diff interpretation No C. difficile detected.  Final    Comment: Performed at [facility], [address] [place], [place] [address]  Gastrointestinal Panel by PCR , Stool     Status: Abnormal   Collection Time: 03/28/23  9:07 PM   Specimen: Stool  Result Value Ref Range Status   Campylobacter species NOT DETECTED NOT DETECTED Final   Plesimonas shigelloides NOT DETECTED NOT DETECTED Final   Salmonella species NOT DETECTED NOT DETECTED Final   Yersinia enterocolitica NOT DETECTED NOT DETECTED Final   Vibrio species NOT DETECTED NOT DETECTED Final   Vibrio cholerae NOT DETECTED NOT DETECTED Final   Enteroaggregative E coli (EAEC) NOT DETECTED NOT DETECTED Final   Enteropathogenic E coli (EPEC) NOT DETECTED NOT DETECTED Final   Enterotoxigenic E coli (ETEC) NOT DETECTED NOT DETECTED Final   Shiga like toxin producing E coli (STEC) NOT DETECTED NOT DETECTED Final   Shigella/Enteroinvasive E coli (EIEC) NOT DETECTED NOT DETECTED Final   Cryptosporidium NOT DETECTED NOT DETECTED Final   Cyclospora cayetanensis NOT DETECTED NOT DETECTED Final   Entamoeba histolytica NOT DETECTED NOT DETECTED Final   Giardia lamblia NOT DETECTED NOT DETECTED Final   Adenovirus F40/41 NOT DETECTED NOT DETECTED Final   Astrovirus NOT DETECTED NOT DETECTED Final   Norovirus GI/GII NOT DETECTED NOT DETECTED Final   Rotavirus A DETECTED (A) NOT DETECTED Final   Sapovirus (I, II, IV, and V) NOT DETECTED NOT DETECTED Final    Comment: Performed at [facility], [address] [place], [place] [address]  Blood Culture (routine x 2)     Status: None (Preliminary result)   Collection Time: 03/28/23  9:07 PM   Specimen: BLOOD  Result Value Ref Range Status   Specimen Description BLOOD BLOOD RIGHT ARM  Final   Special Requests   Final    BOTTLES DRAWN AEROBIC AND ANAEROBIC  Blood Culture adequate volume   Culture   Final    NO GROWTH 2 DAYS Performed at [facility], [address] [place], [place] [address]    Report Status PENDING  Incomplete  Blood Culture (routine x 2)     Status: None (Preliminary result)   Collection Time: 03/28/23  9:07 PM   Specimen: BLOOD  Result Value Ref Range Status   Specimen Description BLOOD BLOOD LEFT ARM  Final   Special Requests   Final    BOTTLES DRAWN AEROBIC AND ANAEROBIC Blood Culture adequate volume   Culture   Final    NO GROWTH 2 DAYS Performed at [facility], [address] [place], [place] [address]    Report Status PENDING  Incomplete     Labs: BNP (last 3 results) No results for input(s): "BNP" in the  last 8760 hours. Basic Metabolic Panel: Recent Labs  Lab 03/28/23 1819 03/29/23 0220 03/30/23 0632  NA 136 137 139  K 4.7 3.7 3.5  CL 101 108 110  CO2 16* 19* 26  GLUCOSE 166* 112* 93  BUN 50* 43* 19  CREATININE 3.36* 1.70* 0.57  CALCIUM 10.2 8.8* 8.3*   Liver Function Tests: Recent Labs  Lab 03/28/23 1819  AST 32  ALT 18  ALKPHOS 77  BILITOT 0.7  PROT 10.1*  ALBUMIN 5.1*   Recent Labs  Lab 03/28/23 1819  LIPASE 29   No results for input(s): "AMMONIA" in the last 168 hours. CBC: Recent Labs  Lab 03/28/23 1819 03/29/23 0220  WBC 21.4* 16.5*  HGB 16.1* 11.8*  HCT 49.5* 35.3*  MCV 96.9 95.4  PLT 451* 345   Cardiac Enzymes: No results for input(s): "CKTOTAL", "CKMB", "CKMBINDEX", "TROPONINI" in the last 168 hours. BNP: Invalid input(s): "POCBNP" CBG: No results for input(s): "GLUCAP" in the last 168 hours. D-Dimer No results for input(s): "DDIMER" in the last 72 hours. Hgb A1c No results for input(s): "HGBA1C" in the last 72 hours. Lipid Profile No results for input(s): "CHOL", "HDL", "LDLCALC", "TRIG", "CHOLHDL", "LDLDIRECT" in the last 72 hours. Thyroid function studies No results for input(s): "TSH", "T4TOTAL", "T3FREE", "THYROIDAB" in the last  72 hours.  Invalid input(s): "FREET3" Anemia work up Recent Labs    03/29/23 0220  VITAMINB12 2,163*   Urinalysis    Component Value Date/Time   COLORURINE YELLOW (A) 03/28/2023 2225   APPEARANCEUR CLOUDY (A) 03/28/2023 2225   LABSPEC 1.014 03/28/2023 2225   PHURINE 5.0 03/28/2023 2225   GLUCOSEU NEGATIVE 03/28/2023 2225   HGBUR SMALL (A) 03/28/2023 2225   HGBUR small 10/07/2009 1123   BILIRUBINUR NEGATIVE 03/28/2023 2225   BILIRUBINUR neg 07/19/2011 1152   KETONESUR NEGATIVE 03/28/2023 2225   PROTEINUR 30 (A) 03/28/2023 2225   UROBILINOGEN 0.2 07/19/2011 1152   UROBILINOGEN 0.2 11/09/2010 1757   NITRITE NEGATIVE 03/28/2023 2225   LEUKOCYTESUR NEGATIVE 03/28/2023 2225   Sepsis Labs Recent Labs  Lab 03/28/23 1819 03/29/23 0220  WBC 21.4* 16.5*   Microbiology Recent Results (from the past 240 hour(s))  C Difficile Quick Screen w PCR reflex     Status: None   Collection Time: 03/28/23  9:05 PM   Specimen: Stool  Result Value Ref Range Status   C Diff antigen NEGATIVE NEGATIVE Final   C Diff toxin NEGATIVE NEGATIVE Final   C Diff interpretation No C. difficile detected.  Final    Comment: Performed at [facility], [address] [place], [place] [address]  Gastrointestinal Panel by PCR , Stool     Status: Abnormal   Collection Time: 03/28/23  9:07 PM   Specimen: Stool  Result Value Ref Range Status   Campylobacter species NOT DETECTED NOT DETECTED Final   Plesimonas shigelloides NOT DETECTED NOT DETECTED Final   Salmonella species NOT DETECTED NOT DETECTED Final   Yersinia enterocolitica NOT DETECTED NOT DETECTED Final   Vibrio species NOT DETECTED NOT DETECTED Final   Vibrio cholerae NOT DETECTED NOT DETECTED Final   Enteroaggregative E coli (EAEC) NOT DETECTED NOT DETECTED Final   Enteropathogenic E coli (EPEC) NOT DETECTED NOT DETECTED Final   Enterotoxigenic E coli (ETEC) NOT DETECTED NOT DETECTED Final   Shiga like toxin producing E coli (STEC)  NOT DETECTED NOT DETECTED Final   Shigella/Enteroinvasive E coli (EIEC) NOT DETECTED NOT DETECTED Final   Cryptosporidium NOT DETECTED NOT DETECTED Final  Cyclospora cayetanensis NOT DETECTED NOT DETECTED Final   Entamoeba histolytica NOT DETECTED NOT DETECTED Final   Giardia lamblia NOT DETECTED NOT DETECTED Final   Adenovirus F40/41 NOT DETECTED NOT DETECTED Final   Astrovirus NOT DETECTED NOT DETECTED Final   Norovirus GI/GII NOT DETECTED NOT DETECTED Final   Rotavirus A DETECTED (A) NOT DETECTED Final   Sapovirus (I, II, IV, and V) NOT DETECTED NOT DETECTED Final    Comment: Performed at [facility], [address] [place], [place] [address]  Blood Culture (routine x 2)     Status: None (Preliminary result)   Collection Time: 03/28/23  9:07 PM   Specimen: BLOOD  Result Value Ref Range Status   Specimen Description BLOOD BLOOD RIGHT ARM  Final   Special Requests   Final    BOTTLES DRAWN AEROBIC AND ANAEROBIC Blood Culture adequate volume   Culture   Final    NO GROWTH 2 DAYS Performed at [facility], [address], [place], [place] [address]    Report Status PENDING  Incomplete  Blood Culture (routine x 2)     Status: None (Preliminary result)   Collection Time: 03/28/23  9:07 PM   Specimen: BLOOD  Result Value Ref Range Status   Specimen Description BLOOD BLOOD LEFT ARM  Final   Special Requests   Final    BOTTLES DRAWN AEROBIC AND ANAEROBIC Blood Culture adequate volume   Culture   Final    NO GROWTH 2 DAYS Performed at [facility], [address], [place], [place] [address]    Report Status PENDING  Incomplete     Time coordinating discharge: Over 30 minutes  SIGNED:   Charise Killian, MD  [facility] 03/30/2023, 11:04 AM Pager   If 7PM-7AM, please contact night-coverage [email or web address]

## 2023-03-30 NOTE — Progress Notes (Signed)
Mobility Specialist - Progress Note   03/30/23 0946  Mobility  Activity Ambulated independently in hallway;Stood at bedside;Dangled on edge of bed  Level of Assistance Modified independent, requires aide device or extra time  Assistive Device None  Distance Ambulated (ft) 160 ft  Activity Response Tolerated well  Mobility Referral Yes  $Mobility charge 1 Mobility   Pt supine in bed on RA upon arrival. Pt completes bed mobility and STS and ambulates in hallway indep. Pt returns to room and is left in bathroom with family present.   Terrilyn Saver  Mobility Specialist  03/30/23 9:48 AM

## 2023-03-31 ENCOUNTER — Telehealth: Payer: Self-pay | Admitting: *Deleted

## 2023-03-31 LAB — THYROID PANEL WITH TSH
Free Thyroxine Index: 2.8 (ref 1.2–4.9)
T3 Uptake Ratio: 33 % (ref 24–39)
T4, Total: 8.4 ug/dL (ref 4.5–12.0)
TSH: 0.375 u[IU]/mL — ABNORMAL LOW (ref 0.450–4.500)

## 2023-03-31 LAB — CULTURE, BLOOD (ROUTINE X 2): Special Requests: ADEQUATE

## 2023-03-31 NOTE — Transitions of Care (Post Inpatient/ED Visit) (Signed)
   03/31/2023  Name: [name] MRN: [id number] DOB: 07-08-1951  Today's TOC FU Call Status: Today's TOC FU Call Status:: Unsuccessul Call (1st Attempt) Unsuccessful Call (1st Attempt) Date: 03/31/23  Attempted to reach the patient regarding the most recent Inpatient/ED visit.  Follow Up Plan: Additional outreach attempts will be made to reach the patient to complete the Transitions of Care (Post Inpatient/ED visit) call.   Gean Maidens BSN RN [facility] [phone]

## 2023-04-02 LAB — CULTURE, BLOOD (ROUTINE X 2)
Culture: NO GROWTH
Special Requests: ADEQUATE

## 2023-04-04 ENCOUNTER — Telehealth: Payer: Self-pay

## 2023-04-04 NOTE — Transitions of Care (Post Inpatient/ED Visit) (Signed)
   04/04/2023  Name: [name] MRN: [id number] DOB: 04/12/1951  Today's TOC FU Call Status: Today's TOC FU Call Status:: Successful TOC FU Call Competed TOC FU Call Complete Date: 04/04/23  Transition Care Management Follow-up Telephone Call Date of Discharge: 03/30/23 Discharge Facility[facility]) Type of Discharge: Inpatient Admission Primary Inpatient Discharge Diagnosis:: Gastroenteritis How have you been since you were released from the hospital?: Better Any questions or concerns?: No  Items Reviewed: Medications obtained,verified, and reconciled?: Yes (Medications Reviewed) Any new allergies since your discharge?: No Dietary orders reviewed?: Yes Type of Diet Ordered:: General Do you have support at home?: Yes People in Home: child(ren), adult Name of Support/Comfort Primary Source: Richard-son  Medications Reviewed Today: Medications Reviewed Today     Reviewed by Jodelle Gross, RN (Case Manager) on 04/04/23 at [phone]  Med List Status: <None>   Medication Order Taking? Sig Documenting Provider Last Dose Status Informant  betamethasone valerate lotion (VALISONE) 0.1 % [id number]  Apply 1 Application topically 2 (two) times daily. Charise Killian, MD  Active   ondansetron (ZOFRAN-ODT) 4 MG disintegrating tablet [id number]  Take 1 tablet (4 mg total) by mouth every 6 (six) hours as needed for up to 14 days for nausea or vomiting. Charise Killian, MD  Active             Home Care and Equipment/Supplies: Were Home Health Services Ordered?: No Any new equipment or medical supplies ordered?: No  Functional Questionnaire: Do you need assistance with bathing/showering or dressing?: No Do you need assistance with meal preparation?: No Do you need assistance with eating?: No Do you have difficulty maintaining continence: No Do you need assistance with getting out of bed/getting out of a chair/moving?: No Do you have difficulty  managing or taking your medications?: No  Follow up appointments reviewed: PCP Follow-up appointment confirmed?: No (Patient refused to allow this writer to schedule appt. Patient to call for appointment.) MD Provider Line Number:[phone] Given: No Specialist Hospital Follow-up appointment confirmed?: NA Do you need transportation to your follow-up appointment?: No Do you understand care options if your condition(s) worsen?: Yes-patient verbalized understanding  SDOH Interventions Today    Flowsheet Row Most Recent Value  SDOH Interventions   Food Insecurity Interventions Intervention Not Indicated  Housing Interventions Intervention Not Indicated  Transportation Interventions Intervention Not Indicated      Interventions Today    Flowsheet Row Most Recent Value  Chronic Disease   Chronic disease during today's visit Other  [Gastroenteritis]  General Interventions   General Interventions Discussed/Reviewed General Interventions Discussed, Doctor Visits  Doctor Visits Discussed/Reviewed PCP, Doctor Visits Discussed  [Patient wants to call her PCP and not have scheduled by care guide.]  PCP/Specialist Visits Compliance with follow-up visit  Nutrition Interventions   Nutrition Discussed/Reviewed Nutrition Discussed  [Patient still has little appetite but is keeping up with fluids to prevent dehydration.]       TOC Interventions Today    Flowsheet Row Most Recent Value  TOC Interventions   TOC Interventions Discussed/Reviewed TOC Interventions Discussed       Jodelle Gross, RN, BSN, CCM Care Management Coordinator [place] Phone: [phone]/Fax: [phone]

## 2023-04-18 ENCOUNTER — Encounter: Payer: Self-pay | Admitting: Family Medicine

## 2023-04-18 ENCOUNTER — Ambulatory Visit (INDEPENDENT_AMBULATORY_CARE_PROVIDER_SITE_OTHER): Payer: Medicare Other | Admitting: Family Medicine

## 2023-04-18 VITALS — BP 117/72 | HR 49 | Resp 18 | Ht 60.0 in | Wt 101.0 lb

## 2023-04-18 DIAGNOSIS — Z9049 Acquired absence of other specified parts of digestive tract: Secondary | ICD-10-CM

## 2023-04-18 DIAGNOSIS — Z932 Ileostomy status: Secondary | ICD-10-CM

## 2023-04-18 DIAGNOSIS — Z09 Encounter for follow-up examination after completed treatment for conditions other than malignant neoplasm: Secondary | ICD-10-CM

## 2023-04-18 DIAGNOSIS — K529 Noninfective gastroenteritis and colitis, unspecified: Secondary | ICD-10-CM | POA: Diagnosis not present

## 2023-04-18 NOTE — Patient Instructions (Addendum)
I have sent a referral for you to see Dr. Allegra Lai at [facility] at [facility]. I am hopeful that this will be a better fit!  I will send you a message on MyChart once the results of your labs are back.

## 2023-04-18 NOTE — Progress Notes (Signed)
Established Patient Office Visit  Subjective   Patient ID: [name], female    DOB: 1951/08/24  Age: 72 y.o. MRN: [id number]  Chief Complaint  Patient presents with   Hospitalization Follow-up    HPI [name] is a 72 y.o. female presenting today for follow up of hospitalization from 03/28/2023 through 03/30/2023.  Presented to the ER after developing new fatigue, decreased appetite, weakness, nausea, multiple loose bowel movements via ileostomy, crampy abdominal pain.  She was given IV fluids in the ER when noted to be hypotensive.  Patient was noted to be poor historian in the hospital, much of [name] was provided by her son-in-law.  Admitted for treatment of rotavirus gastroenteritis with IV fluids and Zofran.  AKI, acute metabolic encephalopathy, metabolic acidosis resolved, all likely secondary to gastroenteritis.  She was discharged with Zofran 4 mg tablets every 6 hours as needed for nausea or vomiting with instructions to follow-up with PCP in 1-2 weeks.  Since returning home, she has not had to use the Zofran.  She denies fever, chills, abdominal pain.  She does have continuing weakness.  ROS Negative unless otherwise noted in HPI   Objective:     BP 117/72 (BP Location: Left Arm, Cuff Size: Normal)   Pulse (!) 49   Resp 18   Ht 5' (1.524 m)   Wt 101 lb (45.8 kg)   SpO2 98%   BMI 19.73 kg/m   Physical Exam Constitutional:      General: She is not in acute distress.    Appearance: Normal appearance.  HENT:     Head: Normocephalic and atraumatic.  Cardiovascular:     Rate and Rhythm: Normal rate and regular rhythm.     Pulses: Normal pulses.     Heart sounds: No murmur heard.    No friction rub. No gallop.  Pulmonary:     Effort: Pulmonary effort is normal. No respiratory distress.     Breath sounds: No wheezing, rhonchi or rales.  Abdominal:     General: Bowel sounds are normal.  Skin:    General: Skin is warm and dry.     Coloration: Skin is  not jaundiced.  Neurological:     Mental Status: She is alert and oriented to person, place, and time.  Psychiatric:        Mood and Affect: Mood normal.        Behavior: Behavior normal.        Thought Content: Thought content normal.     Assessment & Plan:  Gastroenteritis -     CBC with Differential/Platelet; Future -     Comprehensive metabolic panel; Future  Hospital discharge follow-up  [name] -     Ambulatory referral to Gastroenterology  Ileostomy in place [name]) -     Ambulatory referral to Gastroenterology  Upon discharge, labs were trending towards baseline.  Rechecking CMP and CBC today.  Provided referral to gastroenterology as it has been several years since patient saw a specialist.  She does have a [name] of bad experiences with healthcare and is hesitant about going to any office, but she is willing to try another to see if it is a better fit than other places in the past.  She also endorses some weakness in her legs and issues with balance.  It is possible that this may be related to malnutrition and her recent hospitalization, also possible that it is related to parkinsonism but is currently not being treated.  Patient would like to start with GI follow-up before considering referral to neurology.  Parkinsonism currently not being treated.  At follow-up, we will discuss referral to neurology and/or trial of Sinemet.  Return in 3 months (on 07/19/2023) for follow-up for HLD, prediabetes, Parkinsonism.  Also due for Medicare AWV.  I spent 35 minutes on the day of the encounter to include pre-visit record review of hospital notes/labs, face-to-face time with the patient, and post visit ordering of tests and referrals.  [name] Quitter, PA

## 2023-04-20 LAB — CBC WITH DIFFERENTIAL/PLATELET
Basophils Absolute: 0 10*3/uL (ref 0.0–0.2)
Basos: 0 %
EOS (ABSOLUTE): 0.4 10*3/uL (ref 0.0–0.4)
Eos: 5 %
Hematocrit: 38.5 % (ref 34.0–46.6)
Hemoglobin: 12.6 g/dL (ref 11.1–15.9)
Immature Grans (Abs): 0 10*3/uL (ref 0.0–0.1)
Immature Granulocytes: 0 %
Lymphocytes Absolute: 1.7 10*3/uL (ref 0.7–3.1)
Lymphs: 21 %
MCH: 31 pg (ref 26.6–33.0)
MCHC: 32.7 g/dL (ref 31.5–35.7)
MCV: 95 fL (ref 79–97)
Monocytes Absolute: 0.6 10*3/uL (ref 0.1–0.9)
Monocytes: 8 %
Neutrophils Absolute: 5.4 10*3/uL (ref 1.4–7.0)
Neutrophils: 66 %
Platelets: 393 10*3/uL (ref 150–450)
RBC: 4.07 x10E6/uL (ref 3.77–5.28)
RDW: 11.8 % (ref 11.7–15.4)
WBC: 8.1 10*3/uL (ref 3.4–10.8)

## 2023-04-20 LAB — COMPREHENSIVE METABOLIC PANEL
ALT: 14 IU/L (ref 0–32)
AST: 18 IU/L (ref 0–40)
Albumin/Globulin Ratio: 1.4 (ref 1.2–2.2)
Albumin: 4.4 g/dL (ref 3.8–4.8)
Alkaline Phosphatase: 73 IU/L (ref 44–121)
BUN/Creatinine Ratio: 24 (ref 12–28)
BUN: 17 mg/dL (ref 8–27)
Bilirubin Total: 0.8 mg/dL (ref 0.0–1.2)
CO2: 21 mmol/L (ref 20–29)
Calcium: 9.7 mg/dL (ref 8.7–10.3)
Chloride: 104 mmol/L (ref 96–106)
Creatinine, Ser: 0.7 mg/dL (ref 0.57–1.00)
Globulin, Total: 3.2 g/dL (ref 1.5–4.5)
Glucose: 88 mg/dL (ref 70–99)
Potassium: 4.1 mmol/L (ref 3.5–5.2)
Sodium: 141 mmol/L (ref 134–144)
Total Protein: 7.6 g/dL (ref 6.0–8.5)
eGFR: 92 mL/min/{1.73_m2} (ref >59)

## 2023-07-19 ENCOUNTER — Ambulatory Visit: Payer: Medicare Other | Admitting: Family Medicine

## 2023-07-27 ENCOUNTER — Encounter: Payer: Self-pay | Admitting: Gastroenterology

## 2023-07-27 ENCOUNTER — Ambulatory Visit (INDEPENDENT_AMBULATORY_CARE_PROVIDER_SITE_OTHER): Payer: Medicare Other | Admitting: Gastroenterology

## 2023-07-27 VITALS — BP 122/72 | HR 81 | Temp 98.0°F | Ht 60.0 in | Wt 101.5 lb

## 2023-07-27 DIAGNOSIS — R627 Adult failure to thrive: Secondary | ICD-10-CM

## 2023-07-27 DIAGNOSIS — R5383 Other fatigue: Secondary | ICD-10-CM

## 2023-07-27 DIAGNOSIS — E538 Deficiency of other specified B group vitamins: Secondary | ICD-10-CM

## 2023-07-27 DIAGNOSIS — R634 Abnormal weight loss: Secondary | ICD-10-CM

## 2023-07-27 DIAGNOSIS — R531 Weakness: Secondary | ICD-10-CM | POA: Diagnosis not present

## 2023-07-27 DIAGNOSIS — K9419 Other complications of enterostomy: Secondary | ICD-10-CM

## 2023-07-27 NOTE — Patient Instructions (Signed)
Gave samples of Zenpep

## 2023-07-27 NOTE — Progress Notes (Signed)
Arlyss Repress, MD [address]  [address]  [place], [place] [address]  Main: [phone]  Fax: [phone]    Gastroenterology Consultation  Referring Provider:     Melida Quitter, PA Primary Care Physician:  Melida Quitter, PA Primary Gastroenterologist:  Dr. Arlyss Repress Reason for Consultation: Failure to thrive, unexplained weight loss        HPI:   [name] is a 72 y.o. female referred by Melida Quitter, PA  for consultation & management of unexplained weight loss, generalized fatigue, hair loss, weakness.  Patient reports that ever since she was released from hospital in May 2024 after she was hospitalized due to dehydration from rotavirus infection, she lost about 10 pounds and had significant hair loss.  She does not have good appetite.  She reports also increasing ostomy output which is watery.  She can manage the consistency of the output by eating peanut butter.  She does not take Imodium.  She is questioning why her B12 levels are always high even though she feels she is malnourished and weak.  No history of anemia, LFTs normal including total protein levels She also reports intermittent rectal bleeding which she is scant and bright red Questionable history of ulcerative colitis, not on any treatment for it  Her surgical history with course of events as described below  Underwent emergency C-section in 12/23/1989 In October 23 2007, she was treated with steroid and antibiotic for eye infection in both eyes On 11/14/2007, she was hospitalized after she underwent colonoscopy In April 2009 she was hospitalized and required blood transfusion In May 2009, she underwent emergency subtotal colectomy with ileostomy secondary to perforated colon with peritonitis In 06/2008 she was hospitalized secondary to left subphrenic abscess and left pleural effusion In December 2009, 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 Melvia Heaps [name] is returned from her upper endoscopy. Exam was normal. A bravo pH hope was placed. It demonstrated significant gastroesophageal reflux over the 48 hour period.   Past Medical History:  Diagnosis Date   Anorexia    Ulcerative colitis     Past Surgical History:  Procedure Laterality Date   CESAREAN SECTION     COLECTOMY     ILEOSTOMY  5/09   Dr.Ingram --drained abscess and ileostomy    No current outpatient medications on file.   Family History  Problem Relation Age of Onset   Colitis Sister    Crohn's disease Sister    Stroke Father    Hypertension Father    Hyperlipidemia Father    Stroke Brother    Hypertension Brother    Hyperlipidemia Brother    Hypertension Paternal Grandmother    Stroke Paternal Grandmother      Social History   Tobacco Use   Smoking status: Never   Smokeless tobacco: Never  Vaping Use   Vaping status: Never Used  Substance Use Topics   Alcohol use: No   Drug use: No    Allergies as of 07/27/2023 - Review Complete 07/27/2023  Allergen Reaction Noted   Mesalamine  09/09/2008  Review of Systems:    All systems reviewed and negative except where noted in HPI.   Physical Exam:  BP 122/72 (BP Location: Left Arm, Patient Position: Sitting, Cuff Size: Normal)   Pulse 81   Temp 98 F (36.7 C) (Oral)   Ht 5' (1.524 m)   Wt 101 lb 8 oz (46 kg)   BMI 19.82 kg/m  No LMP recorded. Patient is postmenopausal.  General:   Alert, thin built, moderately nourished, pleasant and cooperative in NAD Head:  Normocephalic and  atraumatic. Eyes:  Sclera clear, no icterus.   Conjunctiva pink. Ears:  Normal auditory acuity. Nose:  No deformity, discharge, or lesions. Mouth:  No deformity or lesions,oropharynx pink & moist. Neck:  Supple; no masses or thyromegaly. Lungs:  Respirations even and unlabored.  Clear throughout to auscultation.   No wheezes, crackles, or rhonchi. No acute distress. Heart:  Regular rate and rhythm; no murmurs, clicks, rubs, or gallops. Abdomen:  Normal bowel sounds. Soft, ileostomy in the right lower quadrant, non-tender and non-distended without masses, hepatosplenomegaly or hernias noted.  No guarding or rebound tenderness.   Rectal: Not performed Msk:  Symmetrical without gross deformities. Good, equal movement & strength bilaterally. Pulses:  Normal pulses noted. Extremities:  No clubbing or edema.  No cyanosis. Neurologic:  Alert and oriented x3;  grossly normal neurologically. Skin:  Intact without significant lesions or rashes. No jaundice. Psych:  Alert and cooperative. Normal mood and affect.  Imaging Studies: Reviewed  Assessment and Plan:   [name] is a 72 y.o. Caucasian female with history of ?  Ulcerative colitis, perforated colon complicated by peritonitis and subtotal colectomy with end ileostomy in 03/2008, previous history of strongyloidiasis infection s/p treatment in 10/2008 is seen in consultation for recent history of unexplained weight loss, generalized weakness, fatigue and increased ileostomy output  Recommend stool studies to rule out any infection Check fecal calprotectin levels Check celiac disease panel, pancreatic fecal elastase levels Check iron panel, folate levels, serum homocysteine levels, zinc Check strongyloidiasis antibodies Discussed with patient regarding upper endoscopy, ileoscopy as well as proctoscopy, however she is reluctant to undergo any procedures at this time  Advised her to start taking Imodium  Follow up in 2 to 3  months   Arlyss Repress, MD

## 2023-07-28 DIAGNOSIS — R627 Adult failure to thrive: Secondary | ICD-10-CM | POA: Diagnosis not present

## 2023-07-30 LAB — IRON,TIBC AND FERRITIN PANEL
Ferritin: 49 ng/mL (ref 15–150)
Iron Saturation: 12 % — ABNORMAL LOW (ref 15–55)
Iron: 50 ug/dL (ref 27–139)
Total Iron Binding Capacity: 405 ug/dL (ref 250–450)
UIBC: 355 ug/dL (ref 118–369)

## 2023-07-30 LAB — HOMOCYSTEINE: Homocysteine: 10.3 umol/L (ref 0.0–19.2)

## 2023-07-30 LAB — CELIAC DISEASE PANEL
Endomysial IgA: NEGATIVE
IgA/Immunoglobulin A, Serum: 467 mg/dL — ABNORMAL HIGH (ref 64–422)
Transglutaminase IgA: <2 U/mL (ref 0–3)

## 2023-07-30 LAB — FOLATE: Folate: 17.4 ng/mL (ref >3.0)

## 2023-07-30 LAB — STRONGYLOIDES, AB, IGG: Strongyloides, Ab, IgG: NEGATIVE

## 2023-07-30 LAB — ZINC: Zinc: 78 ug/dL (ref 44–115)

## 2023-08-01 LAB — GI PROFILE, STOOL, PCR

## 2023-08-01 LAB — PANCREATIC ELASTASE, FECAL: Pancreatic Elastase, Fecal: 197 ug Elast./g — ABNORMAL LOW (ref >200)

## 2023-08-01 LAB — CALPROTECTIN, FECAL: Calprotectin, Fecal: 42 ug/g (ref 0–120)

## 2023-08-02 ENCOUNTER — Telehealth: Payer: Self-pay

## 2023-08-02 DIAGNOSIS — Z832 Family history of diseases of the blood and blood-forming organs and certain disorders involving the immune mechanism: Secondary | ICD-10-CM

## 2023-08-02 MED ORDER — BETAMETHASONE VALERATE 0.1 % EX LOTN: 1.0000 | TOPICAL_LOTION | Freq: Two times a day (BID) | CUTANEOUS | 0 refills | Status: AC

## 2023-08-02 NOTE — Telephone Encounter (Signed)
Requested medication is no longer an active medication. Was last prescribed on 03/30/23 by Dr. Fabienne Bruns.   Please advise

## 2023-08-02 NOTE — Addendum Note (Signed)
Addended by: Saralyn Pilar on: 08/02/2023 03:10 PM   Modules accepted: Orders

## 2023-08-02 NOTE — Telephone Encounter (Signed)
Called and patient verbalized understanding of results and she states she does not want to start on a prescription enzyme right now because she has bad side effects to medications. She states she saw the results on Saturday and she started on a Nature Enzymes protein Essential enzyme. She also order Source Nature Daily Enzyme Digestive Aid 500mg . She states she would rather see if these help her first before starting on a medication. She also found out this weekend that her mother had and her sister has Pernicious anemia and her sister gets monthly Anemia injections

## 2023-08-02 NOTE — Telephone Encounter (Signed)
Prescription Request  08/02/2023  LOV: 04/18/23   What is the name of the medication or equipment? betamethasone valerate lotion 0.1 %   Have you contacted your pharmacy to request a refill? No   Which pharmacy would you like this sent to?   [id number] [place], [place] - [address] AT [facility] [address] [place] [address] Phone: [phone] Fax: [phone]    Patient notified that their request is being sent to the clinical staff for review and that they should receive a response within 2 business days.   Please advise at Mobile [phone] (mobile)  Pt states she is breaking out again.

## 2023-08-02 NOTE — Telephone Encounter (Signed)
Pt returned call requesting call back

## 2023-08-02 NOTE — Telephone Encounter (Signed)
-----   Message from [facility] sent at 08/02/2023  8:42 AM EDT ----- Please inform patient that the pancreatic fecal elastase levels came back low.  Recommend to start Creon 36K or Zenpep 40K 1 capsule with each meal and 1 with snack  RV

## 2023-08-02 NOTE — Telephone Encounter (Signed)
Order the lab work. Patient verbalized understanding and she will come for labs one day this week

## 2023-08-02 NOTE — Telephone Encounter (Signed)
Meds ordered this encounter  Medications   betamethasone valerate lotion (VALISONE) 0.1 %    Sig: Apply 1 Application topically 2 (two) times daily.    Dispense:  60 mL    Refill:  0    Order Specific Question:   Supervising Provider    Answer:   Nani Gasser D [[id number]]

## 2023-08-04 DIAGNOSIS — Z832 Family history of diseases of the blood and blood-forming organs and certain disorders involving the immune mechanism: Secondary | ICD-10-CM | POA: Diagnosis not present

## 2023-08-04 NOTE — Telephone Encounter (Signed)
No other test is needed at this time  RV

## 2023-08-04 NOTE — Addendum Note (Signed)
Addended by: Radene Knee L on: 08/04/2023 02:21 PM   Modules accepted: Orders

## 2023-08-04 NOTE — Addendum Note (Signed)
Addended by: Radene Knee L on: 08/04/2023 02:26 PM   Modules accepted: Orders

## 2023-08-04 NOTE — Telephone Encounter (Signed)
Patient came for labs today but [facility] does not have the lab for Parietal cell antibody total anymore. They did draw the Intrinsic factor antibody. I did ask them to draw extra labs for the patient incase you wanted to add on anything else

## 2023-08-05 LAB — INTRINSIC FACTOR ANTIBODIES: Intrinsic Factor Abs, Serum: 1.1 [AU]/ml (ref 0.0–1.1)

## 2023-09-06 ENCOUNTER — Ambulatory Visit (INDEPENDENT_AMBULATORY_CARE_PROVIDER_SITE_OTHER): Payer: Medicare Other | Admitting: Family Medicine

## 2023-09-06 ENCOUNTER — Encounter: Payer: Self-pay | Admitting: Family Medicine

## 2023-09-06 VITALS — BP 110/73 | HR 69 | Resp 18 | Ht 60.0 in | Wt 102.0 lb

## 2023-09-06 DIAGNOSIS — R300 Dysuria: Secondary | ICD-10-CM

## 2023-09-06 DIAGNOSIS — G20A1 Parkinson's disease without dyskinesia, without mention of fluctuations: Secondary | ICD-10-CM | POA: Diagnosis not present

## 2023-09-06 DIAGNOSIS — R7303 Prediabetes: Secondary | ICD-10-CM

## 2023-09-06 DIAGNOSIS — M791 Myalgia, unspecified site: Secondary | ICD-10-CM | POA: Diagnosis not present

## 2023-09-06 DIAGNOSIS — Z1159 Encounter for screening for other viral diseases: Secondary | ICD-10-CM

## 2023-09-06 DIAGNOSIS — L659 Nonscarring hair loss, unspecified: Secondary | ICD-10-CM

## 2023-09-06 DIAGNOSIS — E559 Vitamin D deficiency, unspecified: Secondary | ICD-10-CM

## 2023-09-06 DIAGNOSIS — E785 Hyperlipidemia, unspecified: Secondary | ICD-10-CM

## 2023-09-06 LAB — POCT URINALYSIS DIP (CLINITEK)
Bilirubin, UA: NEGATIVE
Glucose, UA: NEGATIVE mg/dL
Ketones, POC UA: NEGATIVE mg/dL
Leukocytes, UA: NEGATIVE
Nitrite, UA: NEGATIVE
POC PROTEIN,UA: NEGATIVE
Spec Grav, UA: 1.025 (ref 1.010–1.025)
Urobilinogen, UA: 0.2 U/dL
pH, UA: 6.5 (ref 5.0–8.0)

## 2023-09-06 NOTE — Assessment & Plan Note (Signed)
Last A1c 5.9 on 08/27/2021.  Repeating A1c today.

## 2023-09-06 NOTE — Progress Notes (Signed)
Established Patient Office Visit  Subjective   Patient ID: [name], female    DOB: 1951/06/27  Age: 72 y.o. MRN: [id number]  Chief Complaint  Patient presents with   Urinary Tract Infection    HPI [name] is a 72 y.o. female presenting today for follow up of hyperlipidemia, prediabetes, Parkinsonism. Hyperlipidemia: Currently consuming a general diet.  She prefers to stay off of medications in general, especially if she is not hurting. The 10-year ASCVD risk score (Arnett DK, et al., 2019) is: 8.8% Prediabetes: denies hypoglycemic events, wounds or sores that are not healing well, increased thirst or urination.  Parkinsonism: She continues to have a tremor, she states that if she stays busy during the day it tends to be better and less noticeable for her.  She has never been evaluated by neurology before, but her tremor has been present for several years now.  She also complains of weakness particularly in her legs as well as myalgias and increased hair loss that started about 3 months ago and have been gradually worsening.  Outpatient Medications Prior to Visit  Medication Sig   betamethasone valerate lotion (VALISONE) 0.1 % Apply 1 Application topically 2 (two) times daily.   No facility-administered medications prior to visit.    ROS Negative unless otherwise noted in HPI   Objective:     BP 110/73 (BP Location: Left Arm, Patient Position: Sitting, Cuff Size: Normal)   Pulse 69   Resp 18   Ht 5' (1.524 m)   Wt 102 lb (46.3 kg)   SpO2 97%   BMI 19.92 kg/m   Physical Exam Constitutional:      General: She is not in acute distress.    Appearance: Normal appearance.  HENT:     Head: Normocephalic and atraumatic.  Cardiovascular:     Rate and Rhythm: Normal rate and regular rhythm.     Heart sounds: No murmur heard.    No friction rub. No gallop.  Pulmonary:     Effort: Pulmonary effort is normal. No respiratory distress.     Breath sounds: No  wheezing, rhonchi or rales.  Skin:    General: Skin is warm and dry.  Neurological:     Mental Status: She is alert and oriented to person, place, and time.     Motor: Tremor (Pill-rolling tremor of right upper extremity.  Tremor also present in left upper extremity.) present.    Results for orders placed or performed in visit on 09/06/23  POCT URINALYSIS DIP (CLINITEK)  Result Value Ref Range   Color, UA yellow yellow   Clarity, UA clear clear   Glucose, UA negative negative mg/dL   Bilirubin, UA negative negative   Ketones, POC UA negative negative mg/dL   Spec Grav, UA 1.324 4.010 - 1.025   Blood, UA small (A) negative   pH, UA 6.5 5.0 - 8.0   POC PROTEIN,UA negative negative, trace   Urobilinogen, UA 0.2 0.2 or 1.0 E.U./dL   Nitrite, UA Negative Negative   Leukocytes, UA Negative Negative     Assessment & Plan:  Dyslipidemia, goal LDL below 100 Assessment & Plan: Last lipid panel: LDL 145, HDL 94, triglycerides 91.  Repeating lipid panel today and will adjust management as indicated by results.  Orders: -     Lipid panel; Future  Pre-diabetes Assessment & Plan: Last A1c 5.9 on 08/27/2021.  Repeating A1c today.  Orders: -     Hemoglobin A1c; Future  Parkinson's disease,  unspecified whether dyskinesia present, unspecified whether manifestations fluctuate (HCC) Assessment & Plan: Initially complained of intermittent tremor, hair loss, and fatigue on 08/27/2021.  Workup at that time included CBC, A1c, CMP, TSH, vitamin D, vitamin B-12.  All were within normal limits with the exception of vitamin D which was low, and vitamin B12 which was high.  Patient is agreeable to evaluation with neurology at this time, also collecting labs to evaluate for myalgia and thinning hair.  Referral sent to [facility]  Orders: -     CBC with Differential/Platelet; Future -     Comprehensive metabolic panel; Future -     Ambulatory referral to Neurology  Dysuria -     POCT URINALYSIS DIP  (CLINITEK)  Myalgia -     CBC with Differential/Platelet; Future -     Comprehensive metabolic panel; Future -     Hemoglobin A1c; Future -     TSH; Future -     T4, free; Future -     C-reactive protein; Future -     Sedimentation rate; Future -     ANA w/Reflex if Positive; Future -     Rheumatoid factor; Future  Hair thinning -     CBC with Differential/Platelet; Future -     Comprehensive metabolic panel; Future -     Hemoglobin A1c; Future -     TSH; Future -     T4, free; Future -     C-reactive protein; Future -     Sedimentation rate; Future -     ANA w/Reflex if Positive; Future -     Rheumatoid factor; Future  Screening for viral disease -     Hepatitis C antibody; Future  Vitamin D deficiency -     VITAMIN D 25 Hydroxy (Vit-D Deficiency, Fractures); Future  Urinalysis negative for infectious process, only abnormality was small amount of blood.  Recommend increasing fluid intake, drinking cranberry juice, using Azo or sitz bath's for symptomatic relief.  Return in about 6 months (around 03/06/2024) for follow-up, fasting blood work 1 week before.    Melida Quitter, PA

## 2023-09-06 NOTE — Assessment & Plan Note (Signed)
Initially complained of intermittent tremor, hair loss, and fatigue on 08/27/2021.  Workup at that time included CBC, A1c, CMP, TSH, vitamin D, vitamin B-12.  All were within normal limits with the exception of vitamin D which was low, and vitamin B12 which was high.  Patient is agreeable to evaluation with neurology at this time, also collecting labs to evaluate for myalgia and thinning hair.  Referral sent to [facility].

## 2023-09-06 NOTE — Patient Instructions (Addendum)
Let me know which mail pharmacy you want me to send the prescription to!  I have sent a referral and recommend going to neurology at [facility] in [place].  I will let you know once we get the results back from your blood work today.

## 2023-09-06 NOTE — Assessment & Plan Note (Signed)
Last lipid panel: LDL 145, HDL 94, triglycerides 91.  Repeating lipid panel today and will adjust management as indicated by results.

## 2023-09-07 LAB — CBC WITH DIFFERENTIAL/PLATELET
Basophils Absolute: 0.1 10*3/uL (ref 0.0–0.2)
Basos: 1 %
EOS (ABSOLUTE): 0.4 10*3/uL (ref 0.0–0.4)
Eos: 4 %
Hematocrit: 37.5 % (ref 34.0–46.6)
Hemoglobin: 12.3 g/dL (ref 11.1–15.9)
Immature Grans (Abs): 0 10*3/uL (ref 0.0–0.1)
Immature Granulocytes: 0 %
Lymphocytes Absolute: 1.9 10*3/uL (ref 0.7–3.1)
Lymphs: 21 %
MCH: 31.3 pg (ref 26.6–33.0)
MCHC: 32.8 g/dL (ref 31.5–35.7)
MCV: 95 fL (ref 79–97)
Monocytes Absolute: 0.7 10*3/uL (ref 0.1–0.9)
Monocytes: 8 %
Neutrophils Absolute: 6 10*3/uL (ref 1.4–7.0)
Neutrophils: 66 %
Platelets: 379 10*3/uL (ref 150–450)
RBC: 3.93 x10E6/uL (ref 3.77–5.28)
RDW: 12 % (ref 11.7–15.4)
WBC: 9.1 10*3/uL (ref 3.4–10.8)

## 2023-09-07 LAB — COMPREHENSIVE METABOLIC PANEL
ALT: 14 [IU]/L (ref 0–32)
AST: 18 [IU]/L (ref 0–40)
Albumin: 4.3 g/dL (ref 3.8–4.8)
Alkaline Phosphatase: 72 [IU]/L (ref 44–121)
BUN/Creatinine Ratio: 19 (ref 12–28)
BUN: 14 mg/dL (ref 8–27)
Bilirubin Total: 0.6 mg/dL (ref 0.0–1.2)
CO2: 23 mmol/L (ref 20–29)
Calcium: 9.8 mg/dL (ref 8.7–10.3)
Chloride: 101 mmol/L (ref 96–106)
Creatinine, Ser: 0.74 mg/dL (ref 0.57–1.00)
Globulin, Total: 2.9 g/dL (ref 1.5–4.5)
Glucose: 91 mg/dL (ref 70–99)
Potassium: 4.7 mmol/L (ref 3.5–5.2)
Sodium: 138 mmol/L (ref 134–144)
Total Protein: 7.2 g/dL (ref 6.0–8.5)
eGFR: 86 mL/min/{1.73_m2} (ref >59)

## 2023-09-07 LAB — T4, FREE: Free T4: 1.24 ng/dL (ref 0.82–1.77)

## 2023-09-07 LAB — HEMOGLOBIN A1C
Est. average glucose Bld gHb Est-mCnc: 120 mg/dL
Hgb A1c MFr Bld: 5.8 % — ABNORMAL HIGH (ref 4.8–5.6)

## 2023-09-07 LAB — C-REACTIVE PROTEIN: CRP: 1 mg/L (ref 0–10)

## 2023-09-07 LAB — LIPID PANEL
Chol/HDL Ratio: 2.4 {ratio} (ref 0.0–4.4)
Cholesterol, Total: 224 mg/dL — ABNORMAL HIGH (ref 100–199)
HDL: 93 mg/dL (ref >39)
LDL Chol Calc (NIH): 116 mg/dL — ABNORMAL HIGH (ref 0–99)
Triglycerides: 88 mg/dL (ref 0–149)
VLDL Cholesterol Cal: 15 mg/dL (ref 5–40)

## 2023-09-07 LAB — ANA W/REFLEX IF POSITIVE: Anti Nuclear Antibody (ANA): NEGATIVE

## 2023-09-07 LAB — SEDIMENTATION RATE: Sed Rate: 12 mm/h (ref 0–40)

## 2023-09-07 LAB — HEPATITIS C ANTIBODY: Hep C Virus Ab: NONREACTIVE

## 2023-09-07 LAB — TSH: TSH: 0.894 u[IU]/mL (ref 0.450–4.500)

## 2023-09-07 LAB — RHEUMATOID FACTOR: Rheumatoid fact SerPl-aCnc: 10.4 [IU]/mL (ref <14.0)

## 2023-09-07 LAB — VITAMIN D 25 HYDROXY (VIT D DEFICIENCY, FRACTURES): Vit D, 25-Hydroxy: 28.3 ng/mL — ABNORMAL LOW (ref 30.0–100.0)

## 2023-09-08 ENCOUNTER — Telehealth: Payer: Self-pay

## 2023-09-08 MED ORDER — BETAMETHASONE VALERATE 0.1 % EX LOTN: 1.0000 | TOPICAL_LOTION | Freq: Two times a day (BID) | CUTANEOUS | 3 refills | Status: DC

## 2023-09-08 NOTE — Telephone Encounter (Signed)
Prescription Request  09/08/2023  LOV: 09/06/23  What is the name of the medication or equipment? betamethasone valerate lotion (VALISONE) 0.1 %   Have you contacted your pharmacy to request a refill? No   Which pharmacy would you like this sent to?  [facility] - [place], [place] - [address] [place] [address] Phone: [phone] Fax: [phone]  Patient notified that their request is being sent to the clinical staff for review and that they should receive a response within 2 business days.   Please advise at Mobile [phone] (mobile)

## 2023-09-08 NOTE — Addendum Note (Signed)
Addended by: Tonny Bollman on: 09/08/2023 09:28 AM   Modules accepted: Orders

## 2023-09-08 NOTE — Telephone Encounter (Signed)
Refill sent.

## 2023-09-08 NOTE — Telephone Encounter (Signed)
Pt is going to take Vit D OTC so she can do liquid form.

## 2023-10-31 ENCOUNTER — Telehealth: Payer: Self-pay | Admitting: Gastroenterology

## 2023-10-31 ENCOUNTER — Ambulatory Visit (INDEPENDENT_AMBULATORY_CARE_PROVIDER_SITE_OTHER): Payer: Medicare Other | Admitting: Gastroenterology

## 2023-10-31 ENCOUNTER — Encounter: Payer: Self-pay | Admitting: Gastroenterology

## 2023-10-31 VITALS — BP 117/74 | HR 74 | Temp 98.4°F | Ht 60.0 in | Wt 102.1 lb

## 2023-10-31 DIAGNOSIS — R634 Abnormal weight loss: Secondary | ICD-10-CM | POA: Diagnosis not present

## 2023-10-31 DIAGNOSIS — R531 Weakness: Secondary | ICD-10-CM

## 2023-10-31 DIAGNOSIS — R5383 Other fatigue: Secondary | ICD-10-CM

## 2023-10-31 DIAGNOSIS — R627 Adult failure to thrive: Secondary | ICD-10-CM | POA: Diagnosis not present

## 2023-10-31 DIAGNOSIS — K9419 Other complications of enterostomy: Secondary | ICD-10-CM

## 2023-10-31 DIAGNOSIS — K580 Irritable bowel syndrome with diarrhea: Secondary | ICD-10-CM | POA: Diagnosis not present

## 2023-10-31 MED ORDER — RIFAXIMIN 550 MG PO TABS
550.0000 mg | ORAL_TABLET | Freq: Three times a day (TID) | ORAL | 0 refills | Status: DC
Start: 2023-10-31 — End: 2023-11-14

## 2023-10-31 NOTE — Telephone Encounter (Signed)
The patient called to let Dr. Allegra Lai know she cancel her medication (Rifaximin) because of the price she said that if it is something cheaper, she will try that. The price of the medication with her insurance the price is [phone].

## 2023-10-31 NOTE — Progress Notes (Signed)
[name] Repress, MD [address]  [address]  [place], [place] [address]  Main: [phone]  Fax: [phone]    Gastroenterology Consultation  Referring Provider:     Melida Quitter, PA Primary Care Physician:  Melida Quitter, PA Primary Gastroenterologist:  Dr. Arlyss [name] Reason for Consultation: Failure to thrive, unexplained weight loss        HPI:   [name] is a 72 y.o. female referred by Melida Quitter, PA  for consultation & management of unexplained weight loss, generalized fatigue, hair loss, weakness.  Patient reports that ever since she was released from hospital in May 2024 after she was hospitalized due to dehydration from rotavirus infection, she lost about 10 pounds and had significant hair loss.  She does not have good appetite.  She reports also increasing ostomy output which is watery.  She can manage the consistency of the output by eating peanut butter.  She does not take Imodium.  She is questioning why her B12 levels are always high even though she feels she is malnourished and weak.  No history of anemia, LFTs normal including total protein levels She also reports intermittent rectal bleeding which she is scant and bright red Questionable history of ulcerative colitis, not on any treatment for it  Her surgical history with course of events as described below  Underwent emergency C-section in 12/23/1989 In October 23 2007, she was treated with steroid and antibiotic for eye infection in both eyes On 11/14/2007, she was hospitalized after she underwent colonoscopy In April 2009 she was hospitalized and required blood transfusion In May 2009, she underwent emergency subtotal colectomy with ileostomy secondary to perforated colon with peritonitis In 06/2008 she was hospitalized secondary to left subphrenic abscess and left pleural effusion In December 2009, 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  Since last visit, she underwent workup including stool studies negative for infection, fecal calprotectin levels were normal.  Pancreatic fecal elastase levels were mildly low, strongyloidiasis IgG antibodies were negative, celiac disease panel was negative.  Intrinsic factor antibodies were negative I started her on critic enzymes, apparently she is taking over-the-counter digestive enzymes 2 pills daily only.  She reports her stool consistency is thicker.  However, her weight has been stable.  She is concerned about not gaining weight.  She is requesting ivermectin even though spondylitis antibodies are negative.  She states that when she was on ivermectin in the past she instantly gained about 10 pounds within a week.  NSAIDs: None  Antiplts/Anticoagulants/Anti thrombotics: None  GI Procedures: Upper endoscopy and colonoscopy in 2010 by Dr. Melvia Heaps [name] is returned from her upper endoscopy. Exam was normal. A bravo pH hope was placed. It demonstrated significant gastroesophageal reflux over the 48 hour period.   Past Medical History:  Diagnosis Date   Anorexia    Strongyloidiasis 04/24/2009   Qualifier: Diagnosis of   By: Janit Bern         Ulcerative colitis     Past Surgical History:  Procedure Laterality Date   CESAREAN SECTION     COLECTOMY  ILEOSTOMY  5/09   Dr.Ingram --drained abscess and ileostomy     Current Outpatient Medications:    betamethasone valerate lotion (VALISONE) 0.1 %, Apply 1 Application topically 2 (two) times daily., Disp: 60 mL, Rfl: 3   rifaximin (XIFAXAN) 550 MG TABS tablet, Take 1 tablet (550  mg total) by mouth 3 (three) times daily for 14 days., Disp: 42 tablet, Rfl: 0   Family History  Problem Relation Age of Onset   Stroke Father    Hypertension Father    Hyperlipidemia Father    Colitis Sister    Crohn's disease Sister    Stroke Brother    Hypertension Brother    Hyperlipidemia Brother    Hypertension Paternal Grandmother    Stroke Paternal Grandmother      Social History   Tobacco Use   Smoking status: Never   Smokeless tobacco: Never  Vaping Use   Vaping status: Never Used  Substance Use Topics   Alcohol use: No   Drug use: No    Allergies as of 10/31/2023   (No Known Allergies)    Review of Systems:    All systems reviewed and negative except where noted in HPI.   Physical Exam:  BP 117/74 (BP Location: Right Arm, Patient Position: Sitting, Cuff Size: Normal)   Pulse 74   Temp 98.4 F (36.9 C) (Oral)   Ht 5' (1.524 m)   Wt 102 lb 2 oz (46.3 kg)   BMI 19.94 kg/m  No LMP recorded. Patient is postmenopausal.  General:   Alert, thin built, moderately nourished, pleasant and cooperative in NAD Head:  Normocephalic and atraumatic. Eyes:  Sclera clear, no icterus.   Conjunctiva pink. Ears:  Normal auditory acuity. Nose:  No deformity, discharge, or lesions. Mouth:  No deformity or lesions,oropharynx pink & moist. Neck:  Supple; no masses or thyromegaly. Lungs:  Respirations even and unlabored.  Clear throughout to auscultation.   No wheezes, crackles, or rhonchi. No acute distress. Heart:  Regular rate and rhythm; no murmurs, clicks, rubs, or gallops. Abdomen:  Normal bowel sounds. Soft, ileostomy in the right lower quadrant, non-tender and non-distended without masses, hepatosplenomegaly or hernias noted.  No guarding or rebound tenderness.   Rectal: Not performed Msk:  Symmetrical without gross deformities. Good, equal movement & strength bilaterally. Pulses:  Normal pulses noted. Extremities:  No clubbing or edema.  No cyanosis. Neurologic:   Alert and oriented x3;  grossly normal neurologically. Skin:  Intact without significant lesions or rashes. No jaundice. Psych:  Alert and cooperative. Normal mood and affect.  Imaging Studies: Reviewed  Assessment and Plan:   [name] is a 72 y.o. Caucasian female with history of ?  Ulcerative colitis, perforated colon complicated by peritonitis and subtotal colectomy with end ileostomy in 03/2008, previous history of strongyloidiasis infection s/p treatment in 10/2008 is seen in consultation for recent history of unexplained weight loss, generalized weakness, fatigue and increased ileostomy output.  She underwent extensive workup which came back unremarkable except for mildly low pancreatic fecal elastase levels which is likely secondary pancreatic insufficiency.  Advised her to increase pancreatic enzymes with each meal and snack Discussed about empiric trial of Xifaxan 50 mg 3 times daily for 2 weeks and she is agreeable She is insisting about prescription for ivermectin although her strongyloidiasis antibodies are negative.  She believes that the test is not accurate.  I did not prescribe her ivermectin and offered her to see infectious disease specialist and she did not want  to Check immunoglobulin panel, alpha gal panel, food allergy profile  Follow up in 2 to 3 months   [name] Repress, MD

## 2023-11-02 LAB — FOOD ALLERGY PROFILE

## 2023-11-02 LAB — ALPHA-GAL PANEL: IgE (Immunoglobulin E), Serum: 10 [IU]/mL (ref 6–495)

## 2023-11-02 LAB — IMMUNOGLOBULINS A/E/G/M, SERUM
IgA/Immunoglobulin A, Serum: 482 mg/dL — ABNORMAL HIGH (ref 64–422)
IgG (Immunoglobin G), Serum: 1347 mg/dL (ref 586–1602)
IgM (Immunoglobulin M), Srm: 42 mg/dL (ref 26–217)

## 2023-11-02 MED ORDER — METRONIDAZOLE 250 MG PO TABS
250.0000 mg | ORAL_TABLET | Freq: Three times a day (TID) | ORAL | 0 refills | Status: DC
Start: 2023-11-02 — End: 2023-11-03

## 2023-11-02 NOTE — Telephone Encounter (Signed)
Sent Flagyl to the pharmacy called and informed patient

## 2023-11-02 NOTE — Addendum Note (Signed)
Addended by: Radene Knee L on: 11/02/2023 10:23 AM   Modules accepted: Orders

## 2023-11-02 NOTE — Telephone Encounter (Signed)
History of Flagyl 250 mg 3 times daily for 2 weeks  RV

## 2023-11-03 ENCOUNTER — Telehealth: Payer: Self-pay | Admitting: Gastroenterology

## 2023-11-03 ENCOUNTER — Encounter: Payer: Self-pay | Admitting: Gastroenterology

## 2023-11-03 MED ORDER — METRONIDAZOLE 375 MG PO CAPS: 375.0000 mg | ORAL_CAPSULE | Freq: Three times a day (TID) | ORAL | 0 refills | Status: AC

## 2023-11-03 NOTE — Telephone Encounter (Signed)
Patient called in to changes her tablets to capsule because it is hard for her to dissolved tablets. She will like a call back for confirmation. She use mail order [facility] delivery - [place], [place] - [address] [address] or [id number] - [place], [place] - [address] at [place] [address]

## 2023-11-03 NOTE — Telephone Encounter (Signed)
Sent medication to the pharmacy and called patient and she said to send medication to mail order

## 2023-11-03 NOTE — Telephone Encounter (Signed)
Flagyl does not come in 250 capsules please advise what strength. I only see 375mg 

## 2023-11-03 NOTE — Addendum Note (Signed)
Addended by: Radene Knee L on: 11/03/2023 11:20 AM   Modules accepted: Orders

## 2023-11-03 NOTE — Telephone Encounter (Signed)
Patient states she unable to dissolve tablet in her ostomy bag. She states she has to do this to take her medications. She is wanting to know if this could be change to a capsule and if not can she dissolve this in her ostomy bag

## 2023-12-08 ENCOUNTER — Other Ambulatory Visit: Payer: Self-pay | Admitting: Family Medicine

## 2024-01-05 ENCOUNTER — Encounter: Payer: Self-pay | Admitting: Family Medicine

## 2024-02-28 ENCOUNTER — Other Ambulatory Visit: Payer: Medicare Other

## 2024-03-06 ENCOUNTER — Ambulatory Visit: Payer: Medicare Other | Admitting: Family Medicine

## 2024-04-12 ENCOUNTER — Ambulatory Visit (INDEPENDENT_AMBULATORY_CARE_PROVIDER_SITE_OTHER): Admitting: Gastroenterology

## 2024-04-12 VITALS — BP 125/83 | HR 78 | Temp 97.8°F | Wt 100.6 lb

## 2024-04-12 DIAGNOSIS — R269 Unspecified abnormalities of gait and mobility: Secondary | ICD-10-CM

## 2024-04-12 DIAGNOSIS — K130 Diseases of lips: Secondary | ICD-10-CM

## 2024-04-12 DIAGNOSIS — R251 Tremor, unspecified: Secondary | ICD-10-CM

## 2024-04-12 DIAGNOSIS — M6281 Muscle weakness (generalized): Secondary | ICD-10-CM

## 2024-04-12 NOTE — Patient Instructions (Addendum)
 Referral will be faxed to   [facility]  [address] [place], [place], [place] [address] Phone: [phone]  For labs, please go to [facility] drawing station to have labs drawn  [facility] Address: [address] [place], [place], [place] [address]

## 2024-04-12 NOTE — Progress Notes (Signed)
 [name], MD [address]  [address]  [place], [place] [address]  Main: [phone]  Fax: [phone]    Gastroenterology Consultation  Referring Provider:     Noreene Bearded, PA Primary Care Physician:  Noreene Bearded, PA Primary Gastroenterologist:  Dr. [name] Reason for Consultation: Failure to thrive, unexplained weight loss        HPI:   [name] is a 73 y.o. female referred by Noreene Bearded, PA  for consultation & management of unexplained weight loss, generalized fatigue, hair loss, weakness.  Patient reports that ever since she was released from hospital in May 2024 after she was hospitalized due to dehydration from rotavirus infection, she lost about 10 pounds and had significant hair loss.  She does not have good appetite.  She reports also increasing ostomy output which is watery.  She can manage the consistency of the output by eating peanut butter.  She does not take Imodium .  She is questioning why her B12 levels are always high even though she feels she is malnourished and weak.  No history of anemia, LFTs normal including total protein levels She also reports intermittent rectal bleeding which she is scant and bright red Questionable history of ulcerative colitis, not on any treatment for it  Her surgical history with course of events as described below  Underwent emergency C-section in 12/23/1989 In October 23 2007, she was treated with steroid and antibiotic for eye infection in both eyes On 11/14/2007, she was hospitalized after she underwent colonoscopy In April 2009 she was hospitalized and required blood transfusion In May 2009, she underwent emergency subtotal colectomy with ileostomy secondary to perforated colon with peritonitis In 06/2008 she was hospitalized secondary to left subphrenic abscess and left pleural effusion In December 2009, 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 is here for follow-up of her GI symptoms.  Since last visit, she underwent workup including stool studies negative for infection, fecal calprotectin levels were normal.  Pancreatic fecal elastase levels were mildly low, strongyloidiasis IgG antibodies were negative, celiac disease panel was negative.  Intrinsic factor antibodies were negative I started her on critic enzymes, apparently she is taking over-the-counter digestive enzymes 2 pills daily only.  She reports her stool consistency is thicker.  However, her weight has been stable.  She is concerned about not gaining weight.  She is requesting ivermectin even though spondylitis antibodies are negative.  She states that when she was on ivermectin in the past she instantly gained about 10 pounds within a week.  Follow-up visit 04/12/2024 [name] is here for follow-up of GI symptoms.  Her ileostomy output is soft pudding-like consistency.  She is not taking pancreatic enzymes.  Takes Imodium  as needed only.  Her weight has been stable.  She is more concerned about resting tremors, proximal muscle weakness in her bilateral lower extremities, gait instability.  She is asking if she could get B12 injections.  She said oral B12 did not work for her.  Although, her B12 levels have always been elevated.  She tried metronidazole  in the past for possible bacterial overgrowth for diarrhea and bloating, which did not help much.  She also reports cracking of corners of her mouth.  For workup of diarrhea, she had food allergy  profile, alpha gal panel as well as immunoglobulin panel which were negative.  She underwent extensive workup in the past.  NSAIDs:  None  Antiplts/Anticoagulants/Anti thrombotics: None  GI Procedures: Upper endoscopy and colonoscopy in 2010 by Dr. Blenda Burdock [name] is returned from her upper endoscopy. Exam was normal. A bravo pH was placed. It demonstrated significant gastroesophageal reflux over the 48 hour period.   Past Medical History:  Diagnosis Date   Anorexia    Strongyloidiasis 04/24/2009   Qualifier: Diagnosis of   By: Tracy Friedlander         Ulcerative colitis     Past Surgical History:  Procedure Laterality Date   CESAREAN SECTION     COLECTOMY     ILEOSTOMY  5/09   Dr.Ingram --drained abscess and ileostomy     Current Outpatient Medications:    betamethasone  valerate lotion (VALISONE ) 0.1 %, APPLY 1 APPLICATION TOPICALLY 2 (TWO) TIMES DAILY., Disp: 60 mL, Rfl: 11   Family History  Problem Relation Age of Onset   Stroke Father    Hypertension Father    Hyperlipidemia Father    Colitis Sister    Crohn's disease Sister    Stroke Brother    Hypertension Brother    Hyperlipidemia Brother    Hypertension Paternal Grandmother    Stroke Paternal Grandmother      Social History   Tobacco Use   Smoking status: Never   Smokeless tobacco: Never  Vaping Use   Vaping status: Never Used  Substance Use Topics   Alcohol use: No   Drug use: No    Allergies as of 04/12/2024   (No Known Allergies)    Review of Systems:    All systems reviewed and negative except where noted in HPI.   Physical Exam:  BP 125/83 (BP Location: Left Arm, Patient Position: Sitting, Cuff Size: Normal)   Pulse 78   Temp 97.8 F (36.6 C) (Oral)   Wt 100 lb 9.6 oz (45.6 kg)   BMI 19.65 kg/m  No LMP recorded. Patient is postmenopausal.  General:   Alert, thin built, moderately nourished, pleasant and cooperative in NAD Head:  Normocephalic and atraumatic. Eyes:  Sclera clear, no icterus.   Conjunctiva pink. Ears:  Normal auditory acuity. Nose:  No deformity, discharge, or lesions. Mouth:  No  deformity or lesions,oropharynx pink & moist. Neck:  Supple; no masses or thyromegaly. Lungs:  Respirations even and unlabored.  Clear throughout to auscultation.   No wheezes, crackles, or rhonchi. No acute distress. Heart:  Regular rate and rhythm; no murmurs, clicks, rubs, or gallops. Abdomen:  Normal bowel sounds. Soft, ileostomy in the right lower quadrant, non-tender and non-distended without masses, hepatosplenomegaly or hernias noted.  No guarding or rebound tenderness.   Rectal: Not performed Msk:  Symmetrical without gross deformities. Good, equal movement & strength bilaterally. Pulses:  Normal pulses noted. Extremities:  No clubbing or edema.  No cyanosis. Neurologic:  Alert and oriented x3;  grossly normal neurologically. Skin:  Intact without significant lesions or rashes. No jaundice. Psych:  Alert and cooperative. Normal mood and affect.  Imaging Studies: Reviewed  Assessment and Plan:   JAHNIYAH REVERE is a 73 y.o. Caucasian female with history of ?  Ulcerative colitis, perforated colon complicated by peritonitis and subtotal colectomy with end ileostomy in 03/2008, previous history of strongyloidiasis infection s/p treatment in 10/2008 is seen  in consultation for recent history of unexplained weight loss, generalized weakness, fatigue and increased ileostomy output.  She underwent extensive workup which came back unremarkable except for mildly low pancreatic fecal elastase levels which is likely secondary pancreatic insufficiency.  She is no longer taking pancreatic enzymes.  She is more concerned about gait instability, resting tremors as well as muscle weakness in her bilateral proximal lower extremities Recommend referral to neurology Check total CK levels, vitamin B1 and B2 Discussed about trial of cholestyramine powder, although she says she does not like to take any medications  Follow up in 6 months   [name], MD

## 2024-04-13 LAB — FOLATE: Folate: 18.8 ng/mL (ref >3.0)

## 2024-04-14 MED ORDER — CHOLESTYRAMINE 4 G PO PACK: 4.0000 g | PACK | Freq: Two times a day (BID) | ORAL | 12 refills | Status: DC

## 2024-04-16 ENCOUNTER — Ambulatory Visit: Payer: Self-pay | Admitting: Gastroenterology

## 2024-04-16 ENCOUNTER — Ambulatory Visit: Admitting: Gastroenterology

## 2024-04-16 LAB — VITAMIN B1: Thiamine: 121.8 nmol/L (ref 66.5–200.0)

## 2024-04-16 LAB — VITAMIN B2, WHOLE BLOOD: Vitamin B2, Whole Blood: 288 ug/L (ref 137–370)

## 2024-04-16 LAB — CK: Total CK: 61 U/L (ref 32–182)

## 2024-06-06 DIAGNOSIS — R2689 Other abnormalities of gait and mobility: Secondary | ICD-10-CM | POA: Diagnosis not present

## 2024-06-06 DIAGNOSIS — M791 Myalgia, unspecified site: Secondary | ICD-10-CM | POA: Diagnosis not present

## 2024-06-06 DIAGNOSIS — R531 Weakness: Secondary | ICD-10-CM | POA: Diagnosis not present

## 2024-06-06 DIAGNOSIS — R251 Tremor, unspecified: Secondary | ICD-10-CM | POA: Diagnosis not present

## 2024-06-13 DIAGNOSIS — Z9049 Acquired absence of other specified parts of digestive tract: Secondary | ICD-10-CM | POA: Diagnosis not present

## 2024-06-13 DIAGNOSIS — K5190 Ulcerative colitis, unspecified, without complications: Secondary | ICD-10-CM | POA: Diagnosis not present

## 2024-06-13 DIAGNOSIS — Z932 Ileostomy status: Secondary | ICD-10-CM | POA: Diagnosis not present

## 2024-06-13 DIAGNOSIS — G20C Parkinsonism, unspecified: Secondary | ICD-10-CM | POA: Diagnosis not present

## 2024-06-18 DIAGNOSIS — K5190 Ulcerative colitis, unspecified, without complications: Secondary | ICD-10-CM | POA: Diagnosis not present

## 2024-06-18 DIAGNOSIS — Z9049 Acquired absence of other specified parts of digestive tract: Secondary | ICD-10-CM | POA: Diagnosis not present

## 2024-06-18 DIAGNOSIS — G20C Parkinsonism, unspecified: Secondary | ICD-10-CM | POA: Diagnosis not present

## 2024-06-18 DIAGNOSIS — Z932 Ileostomy status: Secondary | ICD-10-CM | POA: Diagnosis not present

## 2024-06-22 DIAGNOSIS — Z932 Ileostomy status: Secondary | ICD-10-CM | POA: Diagnosis not present

## 2024-06-22 DIAGNOSIS — G20C Parkinsonism, unspecified: Secondary | ICD-10-CM | POA: Diagnosis not present

## 2024-06-22 DIAGNOSIS — K5190 Ulcerative colitis, unspecified, without complications: Secondary | ICD-10-CM | POA: Diagnosis not present

## 2024-06-22 DIAGNOSIS — Z9049 Acquired absence of other specified parts of digestive tract: Secondary | ICD-10-CM | POA: Diagnosis not present

## 2024-06-23 ENCOUNTER — Emergency Department

## 2024-06-23 ENCOUNTER — Other Ambulatory Visit: Payer: Self-pay

## 2024-06-23 ENCOUNTER — Emergency Department
Admission: EM | Admit: 2024-06-23 | Discharge: 2024-06-23 | Disposition: A | Discharge status: Home / Self Care | Attending: Emergency Medicine | Admitting: Emergency Medicine

## 2024-06-23 DIAGNOSIS — R110 Nausea: Secondary | ICD-10-CM | POA: Diagnosis not present

## 2024-06-23 DIAGNOSIS — R1084 Generalized abdominal pain: Secondary | ICD-10-CM | POA: Diagnosis not present

## 2024-06-23 DIAGNOSIS — R109 Unspecified abdominal pain: Secondary | ICD-10-CM | POA: Insufficient documentation

## 2024-06-23 DIAGNOSIS — R519 Headache, unspecified: Secondary | ICD-10-CM | POA: Insufficient documentation

## 2024-06-23 DIAGNOSIS — I10 Essential (primary) hypertension: Secondary | ICD-10-CM | POA: Diagnosis not present

## 2024-06-23 DIAGNOSIS — R4182 Altered mental status, unspecified: Secondary | ICD-10-CM | POA: Diagnosis not present

## 2024-06-23 DIAGNOSIS — R112 Nausea with vomiting, unspecified: Secondary | ICD-10-CM | POA: Diagnosis not present

## 2024-06-23 DIAGNOSIS — R7309 Other abnormal glucose: Secondary | ICD-10-CM | POA: Diagnosis not present

## 2024-06-23 DIAGNOSIS — G20C Parkinsonism, unspecified: Secondary | ICD-10-CM | POA: Insufficient documentation

## 2024-06-23 DIAGNOSIS — S0990XA Unspecified injury of head, initial encounter: Secondary | ICD-10-CM | POA: Diagnosis not present

## 2024-06-23 LAB — URINALYSIS, ROUTINE W REFLEX MICROSCOPIC
Bacteria, UA: NONE SEEN
Bilirubin Urine: NEGATIVE
Glucose, UA: NEGATIVE mg/dL
Ketones, ur: 5 mg/dL — AB
Leukocytes,Ua: NEGATIVE
Nitrite: NEGATIVE
Protein, ur: NEGATIVE mg/dL
Specific Gravity, Urine: 1.044 — ABNORMAL HIGH (ref 1.005–1.030)
pH: 6 (ref 5.0–8.0)

## 2024-06-23 LAB — COMPREHENSIVE METABOLIC PANEL WITH GFR
ALT: 13 U/L (ref 0–44)
AST: 21 U/L (ref 15–41)
Albumin: 3.9 g/dL (ref 3.5–5.0)
Alkaline Phosphatase: 56 U/L (ref 38–126)
Anion gap: 11 (ref 5–15)
BUN: 15 mg/dL (ref 8–23)
CO2: 23 mmol/L (ref 22–32)
Calcium: 9.8 mg/dL (ref 8.9–10.3)
Chloride: 104 mmol/L (ref 98–111)
Creatinine, Ser: 0.61 mg/dL (ref 0.44–1.00)
GFR, Estimated: >60 mL/min (ref >60)
Glucose, Bld: 145 mg/dL — ABNORMAL HIGH (ref 70–99)
Potassium: 3.8 mmol/L (ref 3.5–5.1)
Sodium: 138 mmol/L (ref 135–145)
Total Bilirubin: 0.8 mg/dL (ref 0.0–1.2)
Total Protein: 7.6 g/dL (ref 6.5–8.1)

## 2024-06-23 LAB — CBC
HCT: 36.3 % (ref 36.0–46.0)
Hemoglobin: 12.1 g/dL (ref 12.0–15.0)
MCH: 31.8 pg (ref 26.0–34.0)
MCHC: 33.3 g/dL (ref 30.0–36.0)
MCV: 95.3 fL (ref 80.0–100.0)
Platelets: 280 K/uL (ref 150–400)
RBC: 3.81 MIL/uL — ABNORMAL LOW (ref 3.87–5.11)
RDW: 12.7 % (ref 11.5–15.5)
WBC: 7.5 K/uL (ref 4.0–10.5)
nRBC: 0 % (ref 0.0–0.2)

## 2024-06-23 LAB — LACTIC ACID, PLASMA: Lactic Acid, Venous: 0.9 mmol/L (ref 0.5–1.9)

## 2024-06-23 LAB — TROPONIN I (HIGH SENSITIVITY): Troponin I (High Sensitivity): 3 ng/L (ref <18)

## 2024-06-23 LAB — CBG MONITORING, ED: Glucose-Capillary: 128 mg/dL — ABNORMAL HIGH (ref 70–99)

## 2024-06-23 LAB — LIPASE, BLOOD: Lipase: 25 U/L (ref 11–51)

## 2024-06-23 LAB — CK: Total CK: 70 U/L (ref 38–234)

## 2024-06-23 MED ORDER — FENTANYL CITRATE PF 50 MCG/ML IJ SOSY
25.0000 ug | PREFILLED_SYRINGE | Freq: Once | INTRAMUSCULAR | Status: AC
  Administered 2024-06-23: 25 ug via INTRAVENOUS
  Filled 2024-06-23: qty 1

## 2024-06-23 MED ORDER — IOHEXOL 300 MG/ML  SOLN
100.0000 mL | Freq: Once | INTRAMUSCULAR | Status: AC | PRN
  Administered 2024-06-23: 75 mL via INTRAVENOUS

## 2024-06-23 MED ORDER — SODIUM CHLORIDE 0.9 % IV BOLUS
500.0000 mL | Freq: Once | INTRAVENOUS | Status: AC
  Administered 2024-06-23: 500 mL via INTRAVENOUS

## 2024-06-23 MED ORDER — ONDANSETRON HCL 4 MG/2ML IJ SOLN
4.0000 mg | INTRAMUSCULAR | Status: AC
  Administered 2024-06-23: 4 mg via INTRAVENOUS
  Filled 2024-06-23: qty 2

## 2024-06-23 MED ORDER — FENTANYL CITRATE PF 50 MCG/ML IJ SOSY
50.0000 ug | PREFILLED_SYRINGE | Freq: Once | INTRAMUSCULAR | Status: AC
  Administered 2024-06-23: 50 ug via INTRAVENOUS
  Filled 2024-06-23: qty 1

## 2024-06-23 MED ORDER — ONDANSETRON HCL 4 MG PO TABS: 4.0000 mg | ORAL_TABLET | Freq: Four times a day (QID) | ORAL | 0 refills | Status: DC | PRN

## 2024-06-23 MED ORDER — HYDROCODONE-ACETAMINOPHEN 5-325 MG PO TABS
0.5000 | ORAL_TABLET | Freq: Four times a day (QID) | ORAL | 0 refills | Status: AC | PRN
Start: 2024-06-23 — End: 2024-06-26

## 2024-06-23 NOTE — ED Provider Notes (Signed)
 Care of this patient assumed from prior physician at 1500 pending completion of labs, imaging, reevaluation, and disposition. Please see prior physician note for further details.  Briefly this is a 73 year old female with history of ulcerative colitis, distant ileostomy presenting with acute onset of abdominal pain.  Labs overall reassuring including CBC, CMP, normal CK, negative lactate, negative troponin, normal lipase.  CXR without focal consolidation CT head resulted without acute bleed CT abdomen pelvis demonstrated postsurgical changes with a large parastomal hernia without bowel obstruction, no acute intra-abdominal or pelvic pathology.  Patient reassessed after second dose of fentanyl .  On reevaluation she reports her abdominal pain is completely resolved.  The hernia around her stoma is soft, I do feel that I am at least able to partially if not completely reduce this.  Patient does not seem to have significant discomfort while I do this.  Son notes that patient does appear to be much closer to her neurologic baseline.  Urinalysis is still pending.  Will provide additional fluids, await results of this.  Urinalysis without evidence of infection.  Patient reassessed.  Remains without recurrent abdominal pain.  RN did note that patient did have weakness when attempting to ambulate.  Did discuss this with patient and family.  They note that patient has waxing and waning strength at baseline and this is not necessarily atypical for her.  Given her initial significant pain and weakness here, I did consider admission and discussed this with patient and family.  They both would prefer to be discharged home with plans for close outpatient follow-up and with strict return precautions.  They report that patient has adequate support at home to be able to care for her and they do not have concerns for her falling.  Will DC with short course of pain medication and nausea medication.  Strict return precautions  provided.  Patient discharged stable condition.   Levander Slate, MD 06/23/24 [phone]

## 2024-06-23 NOTE — ED Notes (Signed)
 Pt ostomy bag empty at this time. Son reporting pt has not eaten today or had anything to drink.

## 2024-06-23 NOTE — ED Provider Notes (Signed)
 [facility] Provider Note    Event Date/Time   First MD Initiated Contact with Patient 06/23/24 1418     (approximate)   History   Altered Mental Status   HPI  [name] is a 73 y.o. female history of parkinsonism, ileostomy, ulcerative colitis documented in her medical list.  Also appears to have a history of a remote abdominal abscess colostomy ileostomy.  Neither patient or her son actually know the reason for the ileostomy   Reviewed record from July 9 at that time concern for Parkinson's was high, an MRI of the brain was recommended.  There were findings including cognitive impairment gait abnormality and resting tremor.  Physical Exam   Triage Vital Signs: ED Triage Vitals  Encounter Vitals Group     BP 06/23/24 1400 (!) 116/56     Girls Systolic BP Percentile --      Girls Diastolic BP Percentile --      Boys Systolic BP Percentile --      Boys Diastolic BP Percentile --      Pulse Rate 06/23/24 1400 71     Resp 06/23/24 1400 20     Temp 06/23/24 1400 97.8 F (36.6 C)     Temp Source 06/23/24 1400 Oral     SpO2 06/23/24 1400 100 %     Weight 06/23/24 1358 103 lb (46.7 kg)     Height 06/23/24 1358 4' 11 (1.499 m)     Head Circumference --      Peak Flow --      Pain Score --      Pain Loc --      Pain Education --      Exclude from Growth Chart --     Most recent vital signs: Vitals:   06/23/24 1400 06/23/24 1410  BP: (!) 116/56 129/67  Pulse: 71 75  Resp: 20 11  Temp: 97.8 F (36.6 C)   SpO2: 100% 100%     General: Awake, keeps eyes closed reports feeling very nauseated with severe abdominal pain.  Also reports a very mild headache.  Able to open eyes, recognizes son, but appears to be in pain.  Pupils are equal round and reactive to light and accommodation.  No noted deficits.  Moves all extremities without deficit.  When she does speak her speech is clear does not appear to be acutely confused or disoriented.  Good  peripheral perfusion.  Normal cardiac tones and rate Resp:  Normal effort.  Bilateral normal work of breathing Abd:  No distention.  Soft no obvious focal area of tenderness but she reports severe abdominal pain throughout exam.  She has an ostomy there is nothing noted in the ostomy bag in per patient and son she has not changed the content of the bag since last night though that is not atypical. Other:  Palpable pulses lower extremities bilaterally.   ED Results / Procedures / Treatments   Labs (all labs ordered are listed, but only abnormal results are displayed) Labs Reviewed  COMPREHENSIVE METABOLIC PANEL WITH GFR - Abnormal; Notable for the following components:      Result Value   Glucose, Bld 145 (*)    All other components within normal limits  CBC - Abnormal; Notable for the following components:   RBC 3.81 (*)    All other components within normal limits  CBG MONITORING, ED - Abnormal; Notable for the following components:   Glucose-Capillary 128 (*)    All  other components within normal limits  LACTIC ACID, PLASMA  LIPASE, BLOOD  CK  URINALYSIS, ROUTINE W REFLEX MICROSCOPIC  TROPONIN I (HIGH SENSITIVITY)     EKG  Interpreted by me at 2 PM.  Normal sinus rhythm.  Slight artifact.  Patient is noted to have mild tremor.  Overall no evidence of frank ischemia.  There is a slight evidence of an incomplete right bundle branch block.  There is no ST elevation or evidence of clear ischemia   RADIOLOGY  CT of the head and CT of the abdomen pelvis as well as chest x-ray are pending at time of signout to Dr. Levander   PROCEDURES:  Critical Care performed: No  Procedures   MEDICATIONS ORDERED IN ED: Medications  sodium chloride  0.9 % bolus 500 mL (500 mLs Intravenous New Bag/Given 06/23/24 1436)  ondansetron  (ZOFRAN ) injection 4 mg (4 mg Intravenous Given 06/23/24 1438)  fentaNYL  (SUBLIMAZE ) injection 25 mcg (25 mcg Intravenous Given 06/23/24 1439)  iohexol  (OMNIPAQUE ) 300  MG/ML solution 100 mL (75 mLs Intravenous Contrast Given 06/23/24 1517)     IMPRESSION / MDM / ASSESSMENT AND PLAN / ED COURSE  I reviewed the triage vital signs and the nursing notes.                              Differential diagnosis includes, but is not limited to, possible acute intra-abdominal pain obstruction perforation etc. ischemia etc. are considered as the patient reports a rather abrupt onset of abdominal pain.  Other considerations such as musculoskeletal muscle strain related to her recent physical therapy etc. would also be considered but felt less likely.  I am genuinely concerned about her degree of pain reported symptoms that are acute onset in nature about 2 to 3 hours ago.  With regard to central neurologic features her last known and discernible known well time would be around 4:30 PM yesterday when her son reports she had completed physical therapy and he saw her.  He did not speak to her and she cannot give a good history as to the exact onset but is suspected about 2 to 3 hours ago began with patient's symptoms of abdominal pain.  She does report a mild component of headache and thus CT of the head is also ordered  She has no chest pain or difficulty breathing.  Her ECG reassuring.  Patient's presentation is most consistent with acute complicated illness / injury requiring diagnostic workup.  ----------------------------------------- 3:29 PM on 06/23/2024 ----------------------------------------- CBC lactic acid CK and comprehensive metabolic panel are unremarkable.  Awaiting imaging studies.  Acute ischemia seeming less likely as to vascular cause with lactic acid that is normal but her pain on presentation is rather abrupt and concerning.  Awaiting CT of the head and abdomen pelvis.  I do believe that if her pain is well-controlled and all her symptoms are much resolved with reassuring imaging she may be able to be discharged, but my overall impression and gestalt is that  she will likely require admission due to the severity and abrupt onset of symptoms.  Much of her workup is still pending and Dr. Levander to follow-up        FINAL CLINICAL IMPRESSION(S) / ED DIAGNOSES   Final diagnoses:  Acute abdominal pain  Nausea     Rx / DC Orders   ED Discharge Orders     None        Note:  This document  was prepared using Conservation officer, historic buildings and may include unintentional dictation errors.   Dicky Anes, MD 06/23/24 1530

## 2024-06-23 NOTE — ED Notes (Signed)
 Pt stating feeling nauseated and stomach hurts. Pt stating it hurts all over stomach.

## 2024-06-23 NOTE — ED Triage Notes (Signed)
 Pt to ED from home with son for altered mental status since about 2 hours ago. Pt answering questions with 1 word answers. Pt knows self, place, time. Pt denies urinary burning. Has not eaten today. Nauseous since last night. Pt has eyes closed, appears pale.

## 2024-06-23 NOTE — Discharge Instructions (Signed)
 You were seen in the Emergency Department today for evaluation of your abdominal pain. Fortunately, your labs, urine test, and CT scan were overall reassuring against an emergency cause for your pain. Please follow-up with your primary care doctor within the next few days for reevaluation. Return to the ER for any new or worsening symptoms including worsening pain, inability to tolerate food or liquids, or any other new or concerning symptoms.  I have sent a prescription for nausea medicine to your pharmacy that you can take as needed.  I will also send a short course of pain medication.  This can make you drowsy, do not drive or operate machinery when taking this.

## 2024-06-25 ENCOUNTER — Encounter: Payer: Self-pay | Admitting: Family Medicine

## 2024-06-25 NOTE — Telephone Encounter (Signed)
 Will call patient on 7/29 to schedule the requested appt per [place].

## 2024-06-26 NOTE — Telephone Encounter (Signed)
 Appt has been scheduled.

## 2024-06-27 ENCOUNTER — Ambulatory Visit (INDEPENDENT_AMBULATORY_CARE_PROVIDER_SITE_OTHER)

## 2024-06-27 VITALS — BP 128/75 | HR 68 | Ht 59.0 in | Wt 103.0 lb

## 2024-06-27 DIAGNOSIS — R1084 Generalized abdominal pain: Secondary | ICD-10-CM | POA: Diagnosis not present

## 2024-06-27 DIAGNOSIS — R110 Nausea: Secondary | ICD-10-CM | POA: Diagnosis not present

## 2024-06-27 MED ORDER — PANTOPRAZOLE SODIUM 40 MG PO TBEC: 40.0000 mg | DELAYED_RELEASE_TABLET | Freq: Two times a day (BID) | ORAL | 0 refills | Status: DC

## 2024-06-27 MED ORDER — PROMETHAZINE HCL 25 MG PO TABS: 25.0000 mg | ORAL_TABLET | Freq: Three times a day (TID) | ORAL | 0 refills | Status: AC | PRN

## 2024-06-27 MED ORDER — ONDANSETRON 4 MG PO TBDP
4.0000 mg | ORAL_TABLET | Freq: Three times a day (TID) | ORAL | 0 refills | Status: AC | PRN
Start: 2024-06-27 — End: ?

## 2024-06-27 MED ORDER — TRAMADOL HCL 50 MG PO TABS
50.0000 mg | ORAL_TABLET | Freq: Four times a day (QID) | ORAL | 0 refills | Status: AC | PRN
Start: 2024-06-27 — End: 2024-07-02

## 2024-06-27 NOTE — Patient Instructions (Addendum)
 It was nice to see you today!  As we discussed in clinic:  -I have sent in sublingual Zofran  to take every 8 hours as needed for nausea  -I have also sent in promethazine  25 mg for you to take at bedtime in place of the Zofran  to help with nausea and improve sleep -I have sent in Tramadol  50 mg, which is a pain medication that does not contain tylenol , that you can use every 6 hours as needed for pain -Continue to avoid NSAIDs and ibuprofen to avoid further stomach upset -I have also sent in pantoprazole , which is a medication that will help with the burping and coat the stomach to help with discomfort. You can take this medication twice daily for 2 weeks  -We are getting some blood work from you today. You will hear from me via MyChart with the results -The number to the Gastroenterology office that you are established at is [phone]   -Please call them and ask to schedule a hospital follow up appointment and let them know that you are already an established patient there. I recommend taking the first available appointment with them for quicker care.  -Continue to increase food intake as tolerated and be sure to keep drinking plenty of water and/or electrolytes to avoid dehydration.  -Please keep me updated through MyChart if symptoms are not getting better!   If you have any problems before your next visit feel free to message me via MyChart (minor issues or questions) or call the office, otherwise you may reach out to schedule an office visit.  Thank you! Saddie Sacks, PA-C

## 2024-06-27 NOTE — Assessment & Plan Note (Addendum)
 Reviewed imaging and lab work from ED in detail. No acute processes or pathology recognized. Discussed likely causes of abdominal pain in the absence of abnormal labs/imaging to include viral GI bug vs gastritis vs H. Pylori vs pain being related to large parastomal hernia or pancreatic insufficiency.  Advised patient to follow up with Dr. Unk, who is now at [facility], or with a provider with the first available appointment for further evaluation.  Will replace Norco with Tramdol (since it does not contain tylenol ) for now to keep pain under control. PDMP reviewed, no aberrancies.  Have also added pantoprazole  40 mg twice daily x 14 days to help with belching and epigastric pain. Number provided for [facility] to call and schedule appointment. Referral also placed today to that practice. Strict follow up precautions discussion and instructions for when to RTED also discussed. Rechecking labs today to include CBC, CMP, lipase, Mg, Phos, and H. Pylori breath test. Ok to follow up via MyChart with results per patient's son.

## 2024-06-27 NOTE — Progress Notes (Signed)
 Established Patient Office Visit  Subjective   Patient ID: [name], female    DOB: [birth year]  Age: 73 y.o. MRN: [id number]  Chief Complaint  Patient presents with   Hospitalization Follow-up    HPI  [name] is a 73 y.o. y/o female who presents to the clinic today for ED follow up. Patient was seen in the ED on 06/23/24 with complaints of acute generalized abdominal pain. Of note, patient has an ileostomy s/p colectomy in 2009. Patient and patient's son unsure of exact reason for the ileostomy, however patient has diagnosed hx of ulcerative colitis. Workup in the ED essentially unremarkable. CBC, CMP, CK, lactate, troponin, lipase all reassuring. Negative UA. CXR without consolidation. CT head without acute bleed. CT abdomen/pelvis revealed postsurgical changes with large parastomal hernia without bowel obstruction, no acute intra-abdominal or pelvic pathology. CT also revealed stable cholelithiasis without cholecystitis. After 2 doses of fentanyl , patient's abdominal pain completely resolved. Patient was stabilized and discharged shortly after with strict follow up precautions. Patient was discharged with prescriptions for hydrocodone  and zofran  and instructions to follow up with PCP in 1 week.   Today, patient reports to the clinic with her son, [name], for hospital follow up. Patient reports that after she got home from the hospital and the fentanyl  wore off, she began experienced abdominal pain and associated nausea again. She reports that the Zofran  does help minimally, but she has not been taking the hydrocodone  because it contains acetaminophen  and patient does not tolerate tylenol  well as it causes nausea for her.  Patient reports that her PO intake has improved but she is not yet back to her baseline. Still experiencing generalized abdominal pain over the area of the umbilicus and associated nausea. She also reports that since all of these symptoms started,  she has been burping/belching constantly. She does not take any medications for reflux. Of note, patient mentions that she did take 2 doses of liquid ibuprofen, which upset her stomach further and increased belching incidence. Patient does feel that her abdominal pain has improved slightly since her ED visit, but is obviously still there and causing her great discomfort. Patient's son does feel that the patient is neurologically at her baseline, just restless and not sleeping well secondary to the abdominal pain.  She denies fever or vomiting. Reports that she typically needs to change her ostomy bag once per day, but does note slightly decreased output, which has caused her to only need to change the bag every other day. Reports stool consistency is softer than normal. Denies frank blood. Denies changes to ostomy site.  Patient was seen by her gastroenterologist, Dr. Unk, on 04/12/24 for unexplained weight loss. Workup was only significant for mildly low pancreatic fecal elastase levels, which is likely secondary to pancreatic insufficiency. Patient was prescribed cholestyramine  powder, but unclear whether she has been taking it as of recently. Patient was advised to follow up in 6 months with GI, however her provider has moved from that practice to [facility] since then.      ROS Per HPI.    Objective:     BP 128/75   Pulse 68   Ht 4' 11 (1.499 m)   Wt 103 lb (46.7 kg)   SpO2 98%   BMI 20.80 kg/m    Physical Exam Constitutional:      Appearance: Normal appearance.  Cardiovascular:     Rate and Rhythm: Normal rate and regular rhythm.  Pulmonary:  Effort: Pulmonary effort is normal.     Breath sounds: Normal breath sounds.  Abdominal:     General: Abdomen is flat. Bowel sounds are normal.     Palpations: Abdomen is soft.     Tenderness: There is abdominal tenderness (Mild TTP over umbilicus). There is no rebound.     Comments: Ileostomy bag present with no surrounding  erythema, swelling, laceration, bleeding, or discharge around stoma site.  Neurological:     Mental Status: She is alert.    No results found for any visits on 06/27/24.  Last CBC Lab Results  Component Value Date   WBC 7.5 06/23/2024   HGB 12.1 06/23/2024   HCT 36.3 06/23/2024   MCV 95.3 06/23/2024   MCH 31.8 06/23/2024   RDW 12.7 06/23/2024   PLT 280 06/23/2024   Last metabolic panel Lab Results  Component Value Date   GLUCOSE 145 (H) 06/23/2024   NA 138 06/23/2024   K 3.8 06/23/2024   CL 104 06/23/2024   CO2 23 06/23/2024   BUN 15 06/23/2024   CREATININE 0.61 06/23/2024   GFRNONAA >60 06/23/2024   CALCIUM 9.8 06/23/2024   PHOS 3.3 08/01/2008   PROT 7.6 06/23/2024   ALBUMIN 3.9 06/23/2024   LABGLOB 2.9 09/06/2023   AGRATIO 1.4 04/18/2023   BILITOT 0.8 06/23/2024   ALKPHOS 56 06/23/2024   AST 21 06/23/2024   ALT 13 06/23/2024   ANIONGAP 11 06/23/2024   Last lipids Lab Results  Component Value Date   CHOL 224 (H) 09/06/2023   HDL 93 09/06/2023   LDLCALC 116 (H) 09/06/2023   LDLDIRECT 112.8 10/15/2008   TRIG 88 09/06/2023   CHOLHDL 2.4 09/06/2023   Last hemoglobin A1c Lab Results  Component Value Date   HGBA1C 5.8 (H) 09/06/2023   Last thyroid  functions Lab Results  Component Value Date   TSH 0.894 09/06/2023   T3TOTAL 110 08/27/2021   T4TOTAL 8.4 03/29/2023      The 10-year ASCVD risk score (Arnett DK, et al., 2019) is: 11.4%    Assessment & Plan:   Acute generalized abdominal pain Assessment & Plan: Reviewed imaging and lab work from ED in detail. No acute processes or pathology recognized. Discussed likely causes of abdominal pain in the absence of abnormal labs/imaging to include viral GI bug vs gastritis vs H. Pylori vs pain being related to large parastomal hernia or pancreatic insufficiency.  Advised patient to follow up with Dr. Unk, who is now at [facility], or with a provider with the first available appointment for further  evaluation.  Will replace Norco with Tramdol (since it does not contain tylenol ) for now to keep pain under control. PDMP reviewed, no aberrancies.  Have also added pantoprazole  40 mg twice daily x 14 days to help with belching and epigastric pain. Number provided for [facility] to call and schedule appointment. Referral also placed today to that practice. Strict follow up precautions discussion and instructions for when to RTED also discussed. Rechecking labs today to include CBC, CMP, lipase, Mg, Phos, and H. Pylori breath test. Ok to follow up via MyChart with results per patient's son.  Orders: -     CBC with Differential/Platelet -     Comprehensive metabolic panel with GFR -     Lipase -     H. pylori breath test -     Magnesium -     Phosphorus -     Ambulatory referral to Gastroenterology  Nausea Assessment & Plan:  Have replaced oral Zofran  4 mg tablets with sublingual Zofran  as requested by patient. Advised that she can take this every 8 hours as needed for nausea. Have also prescribed phenergan  25 mg to take at bedtime for nausea and to assist with sleep while we are getting her pain under control.    Other orders -     traMADol  HCl; Take 1 tablet (50 mg total) by mouth every 6 (six) hours as needed for up to 5 days for severe pain (pain score 7-10).  Dispense: 20 tablet; Refill: 0 -     Pantoprazole  Sodium; Take 1 tablet (40 mg total) by mouth 2 (two) times daily.  Dispense: 30 tablet; Refill: 0 -     Promethazine  HCl; Take 1 tablet (25 mg total) by mouth every 8 (eight) hours as needed for nausea or vomiting (At bedtime as needed).  Dispense: 20 tablet; Refill: 0 -     Ondansetron ; Take 1 tablet (4 mg total) by mouth every 8 (eight) hours as needed for nausea or vomiting.  Dispense: 20 tablet; Refill: 0    Return if symptoms worsen or fail to improve.    Saddie JULIANNA Sacks, PA-C

## 2024-06-27 NOTE — Assessment & Plan Note (Signed)
 Have replaced oral Zofran  4 mg tablets with sublingual Zofran  as requested by patient. Advised that she can take this every 8 hours as needed for nausea. Have also prescribed phenergan  25 mg to take at bedtime for nausea and to assist with sleep while we are getting her pain under control.

## 2024-06-28 ENCOUNTER — Ambulatory Visit: Payer: Self-pay

## 2024-06-28 LAB — COMPREHENSIVE METABOLIC PANEL WITH GFR
ALT: 28 IU/L (ref 0–32)
AST: 30 IU/L (ref 0–40)
Albumin: 4.3 g/dL (ref 3.8–4.8)
Alkaline Phosphatase: 65 IU/L (ref 44–121)
BUN/Creatinine Ratio: 12 (ref 12–28)
BUN: 8 mg/dL (ref 8–27)
Bilirubin Total: 0.4 mg/dL (ref 0.0–1.2)
CO2: 22 mmol/L (ref 20–29)
Calcium: 10.1 mg/dL (ref 8.7–10.3)
Chloride: 100 mmol/L (ref 96–106)
Creatinine, Ser: 0.66 mg/dL (ref 0.57–1.00)
Globulin, Total: 3 g/dL (ref 1.5–4.5)
Glucose: 105 mg/dL — ABNORMAL HIGH (ref 70–99)
Potassium: 3.7 mmol/L (ref 3.5–5.2)
Sodium: 142 mmol/L (ref 134–144)
Total Protein: 7.3 g/dL (ref 6.0–8.5)
eGFR: 93 mL/min/1.73 (ref >59)

## 2024-06-28 LAB — CBC WITH DIFFERENTIAL/PLATELET
Basophils Absolute: 0 x10E3/uL (ref 0.0–0.2)
Basos: 0 %
EOS (ABSOLUTE): 0.1 x10E3/uL (ref 0.0–0.4)
Eos: 1 %
Hematocrit: 38 % (ref 34.0–46.6)
Hemoglobin: 12.3 g/dL (ref 11.1–15.9)
Immature Grans (Abs): 0 x10E3/uL (ref 0.0–0.1)
Immature Granulocytes: 0 %
Lymphocytes Absolute: 1.4 x10E3/uL (ref 0.7–3.1)
Lymphs: 23 %
MCH: 31.5 pg (ref 26.6–33.0)
MCHC: 32.4 g/dL (ref 31.5–35.7)
MCV: 97 fL (ref 79–97)
Monocytes Absolute: 0.9 x10E3/uL (ref 0.1–0.9)
Monocytes: 16 %
Neutrophils Absolute: 3.6 x10E3/uL (ref 1.4–7.0)
Neutrophils: 59 %
Platelets: 271 x10E3/uL (ref 150–450)
RBC: 3.9 x10E6/uL (ref 3.77–5.28)
RDW: 12.1 % (ref 11.7–15.4)
WBC: 6 x10E3/uL (ref 3.4–10.8)

## 2024-06-28 LAB — PHOSPHORUS: Phosphorus: 3 mg/dL (ref 3.0–4.3)

## 2024-06-28 LAB — LIPASE: Lipase: 18 U/L (ref 14–85)

## 2024-06-28 LAB — H. PYLORI BREATH TEST: H pylori Breath Test: NEGATIVE

## 2024-06-28 LAB — MAGNESIUM: Magnesium: 1.9 mg/dL (ref 1.6–2.3)

## 2024-06-29 ENCOUNTER — Other Ambulatory Visit: Payer: Self-pay | Admitting: Physician Assistant

## 2024-06-29 DIAGNOSIS — R531 Weakness: Secondary | ICD-10-CM

## 2024-06-29 DIAGNOSIS — M791 Myalgia, unspecified site: Secondary | ICD-10-CM

## 2024-06-29 DIAGNOSIS — R251 Tremor, unspecified: Secondary | ICD-10-CM

## 2024-06-29 DIAGNOSIS — R2689 Other abnormalities of gait and mobility: Secondary | ICD-10-CM

## 2024-06-29 DIAGNOSIS — M6281 Muscle weakness (generalized): Secondary | ICD-10-CM

## 2024-06-29 DIAGNOSIS — Z9181 History of falling: Secondary | ICD-10-CM

## 2024-07-02 DIAGNOSIS — Z9049 Acquired absence of other specified parts of digestive tract: Secondary | ICD-10-CM | POA: Diagnosis not present

## 2024-07-02 DIAGNOSIS — G20C Parkinsonism, unspecified: Secondary | ICD-10-CM | POA: Diagnosis not present

## 2024-07-02 DIAGNOSIS — K5190 Ulcerative colitis, unspecified, without complications: Secondary | ICD-10-CM | POA: Diagnosis not present

## 2024-07-02 DIAGNOSIS — Z932 Ileostomy status: Secondary | ICD-10-CM | POA: Diagnosis not present

## 2024-07-02 MED ORDER — DIAZEPAM 5 MG PO TABS: 5.0000 mg | ORAL_TABLET | Freq: Once | ORAL | 0 refills | Status: AC

## 2024-07-04 ENCOUNTER — Ambulatory Visit
Admission: RE | Admit: 2024-07-04 | Discharge: 2024-07-04 | Disposition: A | Discharge status: Home / Self Care | Source: Ambulatory Visit | Attending: Physician Assistant | Admitting: Physician Assistant

## 2024-07-04 ENCOUNTER — Other Ambulatory Visit

## 2024-07-04 ENCOUNTER — Other Ambulatory Visit: Payer: Self-pay

## 2024-07-04 DIAGNOSIS — M6281 Muscle weakness (generalized): Secondary | ICD-10-CM | POA: Diagnosis not present

## 2024-07-04 DIAGNOSIS — R2689 Other abnormalities of gait and mobility: Secondary | ICD-10-CM | POA: Diagnosis not present

## 2024-07-04 DIAGNOSIS — M791 Myalgia, unspecified site: Secondary | ICD-10-CM | POA: Diagnosis not present

## 2024-07-04 DIAGNOSIS — R251 Tremor, unspecified: Secondary | ICD-10-CM | POA: Insufficient documentation

## 2024-07-04 DIAGNOSIS — I6782 Cerebral ischemia: Secondary | ICD-10-CM | POA: Diagnosis not present

## 2024-07-04 DIAGNOSIS — Z9181 History of falling: Secondary | ICD-10-CM | POA: Insufficient documentation

## 2024-07-04 DIAGNOSIS — R531 Weakness: Secondary | ICD-10-CM | POA: Diagnosis not present

## 2024-07-06 DIAGNOSIS — K5190 Ulcerative colitis, unspecified, without complications: Secondary | ICD-10-CM | POA: Diagnosis not present

## 2024-07-06 DIAGNOSIS — Z932 Ileostomy status: Secondary | ICD-10-CM | POA: Diagnosis not present

## 2024-07-06 DIAGNOSIS — Z9049 Acquired absence of other specified parts of digestive tract: Secondary | ICD-10-CM | POA: Diagnosis not present

## 2024-07-06 DIAGNOSIS — G20C Parkinsonism, unspecified: Secondary | ICD-10-CM | POA: Diagnosis not present

## 2024-07-09 DIAGNOSIS — K5190 Ulcerative colitis, unspecified, without complications: Secondary | ICD-10-CM | POA: Diagnosis not present

## 2024-07-09 DIAGNOSIS — Z932 Ileostomy status: Secondary | ICD-10-CM | POA: Diagnosis not present

## 2024-07-09 DIAGNOSIS — G20C Parkinsonism, unspecified: Secondary | ICD-10-CM | POA: Diagnosis not present

## 2024-07-09 DIAGNOSIS — Z9049 Acquired absence of other specified parts of digestive tract: Secondary | ICD-10-CM | POA: Diagnosis not present

## 2024-07-10 DIAGNOSIS — Z932 Ileostomy status: Secondary | ICD-10-CM | POA: Diagnosis not present

## 2024-07-10 DIAGNOSIS — G20C Parkinsonism, unspecified: Secondary | ICD-10-CM | POA: Diagnosis not present

## 2024-07-10 DIAGNOSIS — K5190 Ulcerative colitis, unspecified, without complications: Secondary | ICD-10-CM | POA: Diagnosis not present

## 2024-07-10 DIAGNOSIS — Z9049 Acquired absence of other specified parts of digestive tract: Secondary | ICD-10-CM | POA: Diagnosis not present

## 2024-07-12 DIAGNOSIS — K5190 Ulcerative colitis, unspecified, without complications: Secondary | ICD-10-CM | POA: Diagnosis not present

## 2024-07-12 DIAGNOSIS — Z9049 Acquired absence of other specified parts of digestive tract: Secondary | ICD-10-CM | POA: Diagnosis not present

## 2024-07-12 DIAGNOSIS — Z932 Ileostomy status: Secondary | ICD-10-CM | POA: Diagnosis not present

## 2024-07-12 DIAGNOSIS — G20C Parkinsonism, unspecified: Secondary | ICD-10-CM | POA: Diagnosis not present

## 2024-07-13 DIAGNOSIS — K5190 Ulcerative colitis, unspecified, without complications: Secondary | ICD-10-CM | POA: Diagnosis not present

## 2024-07-13 DIAGNOSIS — Z932 Ileostomy status: Secondary | ICD-10-CM | POA: Diagnosis not present

## 2024-07-13 DIAGNOSIS — Z9049 Acquired absence of other specified parts of digestive tract: Secondary | ICD-10-CM | POA: Diagnosis not present

## 2024-07-13 DIAGNOSIS — G20C Parkinsonism, unspecified: Secondary | ICD-10-CM | POA: Diagnosis not present

## 2024-07-17 DIAGNOSIS — K5190 Ulcerative colitis, unspecified, without complications: Secondary | ICD-10-CM | POA: Diagnosis not present

## 2024-07-17 DIAGNOSIS — Z932 Ileostomy status: Secondary | ICD-10-CM | POA: Diagnosis not present

## 2024-07-17 DIAGNOSIS — Z9049 Acquired absence of other specified parts of digestive tract: Secondary | ICD-10-CM | POA: Diagnosis not present

## 2024-07-17 DIAGNOSIS — G20C Parkinsonism, unspecified: Secondary | ICD-10-CM | POA: Diagnosis not present

## 2024-07-18 MED ORDER — TRAZODONE HCL 50 MG PO TABS: 25.0000 mg | ORAL_TABLET | Freq: Every day | ORAL | 0 refills | Status: DC

## 2024-07-19 DIAGNOSIS — K5190 Ulcerative colitis, unspecified, without complications: Secondary | ICD-10-CM | POA: Diagnosis not present

## 2024-07-19 DIAGNOSIS — G20C Parkinsonism, unspecified: Secondary | ICD-10-CM | POA: Diagnosis not present

## 2024-07-19 DIAGNOSIS — Z932 Ileostomy status: Secondary | ICD-10-CM | POA: Diagnosis not present

## 2024-07-19 DIAGNOSIS — Z9049 Acquired absence of other specified parts of digestive tract: Secondary | ICD-10-CM | POA: Diagnosis not present

## 2024-07-24 DIAGNOSIS — G20C Parkinsonism, unspecified: Secondary | ICD-10-CM | POA: Diagnosis not present

## 2024-07-24 DIAGNOSIS — Z9049 Acquired absence of other specified parts of digestive tract: Secondary | ICD-10-CM | POA: Diagnosis not present

## 2024-07-24 DIAGNOSIS — K5190 Ulcerative colitis, unspecified, without complications: Secondary | ICD-10-CM | POA: Diagnosis not present

## 2024-07-24 DIAGNOSIS — Z932 Ileostomy status: Secondary | ICD-10-CM | POA: Diagnosis not present

## 2024-07-26 DIAGNOSIS — K5190 Ulcerative colitis, unspecified, without complications: Secondary | ICD-10-CM | POA: Diagnosis not present

## 2024-07-26 DIAGNOSIS — Z932 Ileostomy status: Secondary | ICD-10-CM | POA: Diagnosis not present

## 2024-07-26 DIAGNOSIS — G20C Parkinsonism, unspecified: Secondary | ICD-10-CM | POA: Diagnosis not present

## 2024-07-26 DIAGNOSIS — Z9049 Acquired absence of other specified parts of digestive tract: Secondary | ICD-10-CM | POA: Diagnosis not present

## 2024-08-01 DIAGNOSIS — Z932 Ileostomy status: Secondary | ICD-10-CM | POA: Diagnosis not present

## 2024-08-01 DIAGNOSIS — Z9049 Acquired absence of other specified parts of digestive tract: Secondary | ICD-10-CM | POA: Diagnosis not present

## 2024-08-01 DIAGNOSIS — K5190 Ulcerative colitis, unspecified, without complications: Secondary | ICD-10-CM | POA: Diagnosis not present

## 2024-08-01 DIAGNOSIS — G20C Parkinsonism, unspecified: Secondary | ICD-10-CM | POA: Diagnosis not present

## 2024-08-02 DIAGNOSIS — K5190 Ulcerative colitis, unspecified, without complications: Secondary | ICD-10-CM | POA: Diagnosis not present

## 2024-08-02 DIAGNOSIS — Z9049 Acquired absence of other specified parts of digestive tract: Secondary | ICD-10-CM | POA: Diagnosis not present

## 2024-08-02 DIAGNOSIS — G20C Parkinsonism, unspecified: Secondary | ICD-10-CM | POA: Diagnosis not present

## 2024-08-02 DIAGNOSIS — Z932 Ileostomy status: Secondary | ICD-10-CM | POA: Diagnosis not present

## 2024-08-08 DIAGNOSIS — G20C Parkinsonism, unspecified: Secondary | ICD-10-CM | POA: Diagnosis not present

## 2024-08-08 DIAGNOSIS — K5190 Ulcerative colitis, unspecified, without complications: Secondary | ICD-10-CM | POA: Diagnosis not present

## 2024-08-08 DIAGNOSIS — Z932 Ileostomy status: Secondary | ICD-10-CM | POA: Diagnosis not present

## 2024-08-08 DIAGNOSIS — Z9049 Acquired absence of other specified parts of digestive tract: Secondary | ICD-10-CM | POA: Diagnosis not present

## 2024-08-09 ENCOUNTER — Other Ambulatory Visit: Payer: Self-pay

## 2024-08-09 ENCOUNTER — Telehealth: Payer: Self-pay | Admitting: *Deleted

## 2024-08-09 DIAGNOSIS — G20C Parkinsonism, unspecified: Secondary | ICD-10-CM | POA: Diagnosis not present

## 2024-08-09 DIAGNOSIS — K5190 Ulcerative colitis, unspecified, without complications: Secondary | ICD-10-CM | POA: Diagnosis not present

## 2024-08-09 DIAGNOSIS — Z9049 Acquired absence of other specified parts of digestive tract: Secondary | ICD-10-CM | POA: Diagnosis not present

## 2024-08-09 DIAGNOSIS — Z932 Ileostomy status: Secondary | ICD-10-CM | POA: Diagnosis not present

## 2024-08-09 NOTE — Telephone Encounter (Signed)
 I have filled out the form and Katrina faxed back today on 08/09/24.

## 2024-08-09 NOTE — Telephone Encounter (Signed)
 Copied from CRM [id number]. Topic: Clinical - Medication Question >> Aug 09, 2024  9:12 AM [name] wrote: Reason for CRM: [place] called in because wanted an update on fax that was sent on 09/03 was uploaded to pt chart on 09. Wondering when fax will be sent back and filled. Pls callback with update.     Callback [phone]

## 2024-08-09 NOTE — Telephone Encounter (Signed)
 I have sent this to provider and also printed if she wishes to fill this medication.  This Medication is not on current med list.

## 2024-08-13 ENCOUNTER — Other Ambulatory Visit: Payer: Self-pay

## 2024-08-13 DIAGNOSIS — Z9049 Acquired absence of other specified parts of digestive tract: Secondary | ICD-10-CM

## 2024-08-13 DIAGNOSIS — Z932 Ileostomy status: Secondary | ICD-10-CM

## 2024-08-16 ENCOUNTER — Telehealth: Payer: Self-pay

## 2024-08-16 ENCOUNTER — Other Ambulatory Visit: Payer: Self-pay

## 2024-08-16 DIAGNOSIS — E785 Hyperlipidemia, unspecified: Secondary | ICD-10-CM

## 2024-08-16 DIAGNOSIS — E559 Vitamin D deficiency, unspecified: Secondary | ICD-10-CM

## 2024-08-16 DIAGNOSIS — R7303 Prediabetes: Secondary | ICD-10-CM

## 2024-08-16 NOTE — Telephone Encounter (Signed)
 Sent a [employer] geologist .

## 2024-08-16 NOTE — Telephone Encounter (Signed)
 Copied from CRM [id number]. Topic: Clinical - Request for Lab/Test Order >> Aug 16, 2024  3:26 PM Pinkey ORN wrote: Reason for CRM: Lab Work >> Aug 16, 2024  3:26 PM Pinkey ORN wrote: Patient is requesting lab orders are put in prior to her upcoming appointment Oct 21.

## 2024-08-17 ENCOUNTER — Ambulatory Visit (HOSPITAL_COMMUNITY)
Admission: RE | Admit: 2024-08-17 | Discharge: 2024-08-17 | Disposition: A | Discharge status: Home / Self Care | Source: Ambulatory Visit

## 2024-08-17 DIAGNOSIS — Z432 Encounter for attention to ileostomy: Secondary | ICD-10-CM

## 2024-08-17 DIAGNOSIS — L24B3 Irritant contact dermatitis related to fecal or urinary stoma or fistula: Secondary | ICD-10-CM

## 2024-08-17 DIAGNOSIS — Z932 Ileostomy status: Secondary | ICD-10-CM | POA: Insufficient documentation

## 2024-08-17 NOTE — Discharge Instructions (Signed)
 Mio flip Referral to dietician [facility]ses [facility]

## 2024-08-17 NOTE — Progress Notes (Signed)
 [facility]   Reason for visit:  RLQ ileostomy for several years. Is interested in new pouching options.  She is changing daily due to adhesive on Hollister pouch lifting.  She is feeling very weak lately, weight loss and fatigue.  She has seen her PCP, labs were obtained and neurology consult placed for tremors, gait instability.  HPI:  Ulcerative colitis, perforated colon with end ileostomy, 2009 Past Medical History:  Diagnosis Date   Anorexia    Strongyloidiasis 04/24/2009   Qualifier: Diagnosis of   By: [name]         Ulcerative colitis    Family History  Problem Relation Age of Onset   Stroke Father    Hypertension Father    Hyperlipidemia Father    Colitis Sister    Crohn's disease Sister    Stroke Brother    Hypertension Brother    Hyperlipidemia Brother    Hypertension Paternal Grandmother    Stroke Paternal Grandmother    No Known Allergies Current Outpatient Medications  Medication Sig Dispense Refill Last Dose/Taking   betamethasone  valerate lotion (VALISONE ) 0.1 % APPLY 1 APPLICATION TOPICALLY 2 (TWO) TIMES DAILY. 60 mL 11    cholestyramine  (QUESTRAN ) 4 g packet Take 1 packet (4 g total) by mouth 2 (two) times daily. 60 each 12    ondansetron  (ZOFRAN -ODT) 4 MG disintegrating tablet Take 1 tablet (4 mg total) by mouth every 8 (eight) hours as needed for nausea or vomiting. 20 tablet 0    pantoprazole  (PROTONIX ) 40 MG tablet TAKE 1 TABLET BY MOUTH TWICE A DAY 180 tablet 1    promethazine  (PHENERGAN ) 25 MG tablet Take 1 tablet (25 mg total) by mouth every 8 (eight) hours as needed for nausea or vomiting (At bedtime as needed). 20 tablet 0    traZODone  (DESYREL ) 50 MG tablet Take 0.5 tablets (25 mg total) by mouth at bedtime. 45 tablet 0    No current facility-administered medications for this encounter.   ROS  Review of Systems  Constitutional:  Positive for appetite change, fatigue and unexpected weight change.  Gastrointestinal:        RLQ  ileostomy Peristomal hernia  Endocrine: Positive for cold intolerance.  Musculoskeletal:  Positive for myalgias.  Skin:  Positive for color change.  Neurological:  Positive for tremors.  All other systems reviewed and are negative.  Vital signs:  BP 118/61 (BP Location: Right Arm)   Pulse 85   Temp 98 F (36.7 C) (Oral)   Resp 18   SpO2 92%  Exam:  Physical Exam Vitals reviewed.  Constitutional:      Appearance: Normal appearance.  HENT:     Mouth/Throat:     Mouth: Mucous membranes are moist.  Cardiovascular:     Rate and Rhythm: Normal rate.  Pulmonary:     Effort: Pulmonary effort is normal.  Abdominal:     Palpations: Abdomen is soft.  Musculoskeletal:        General: Normal range of motion.  Skin:    General: Skin is warm and dry.     Findings: Erythema present.  Neurological:     General: No focal deficit present.     Mental Status: She is alert and oriented to person, place, and time.  Psychiatric:        Mood and Affect: Mood normal.        Behavior: Behavior normal.     Stoma type/location:  RLQ ilestomy Stomal assessment/size:  7/8 pink and  moist  budded Peristomal assessment:  Parastomal hernia. Bulging from 6 to 12 o'clock around stoma.  Skin is intact  initially red with pouch removal but this quickly faded.  Treatment options for stomal/peristomal skin: Switching to Mio FLip pouch for hernia and bulges.  THis is not available in the pre-sized option, but her son in law states family could assist with cutting barrier opening ahead of time.  Also given a sample of 1 piece convex pouch.  This has a presized option. She would like the Mio flip option if it works because it really conforms the shape of her abdomen around the stoma and the hernia.  Output: liquid brown stool  Ostomy pouching: 1pc. Mio flip convex for hernia.  Was previously in 2 piece 1 3/4 convex pouch.  Customer service manager)  Education provided:  See above  New pouching options.  Uses [facility]  for supplies.     Impression/dx  Ileostomy Parastomal hernia Risk for dehydration Discussion  Would like referral to dietician due to weight loss, fatigue and muscle weakness Plan  Call clinic as needed.     Visit time: 50 minutes.   [name] Cooley FNP-BC

## 2024-08-20 DIAGNOSIS — L24B3 Irritant contact dermatitis related to fecal or urinary stoma or fistula: Secondary | ICD-10-CM | POA: Insufficient documentation

## 2024-08-20 DIAGNOSIS — Z432 Encounter for attention to ileostomy: Secondary | ICD-10-CM | POA: Insufficient documentation

## 2024-08-31 DIAGNOSIS — Z9049 Acquired absence of other specified parts of digestive tract: Secondary | ICD-10-CM

## 2024-08-31 DIAGNOSIS — R7303 Prediabetes: Secondary | ICD-10-CM

## 2024-08-31 DIAGNOSIS — Z932 Ileostomy status: Secondary | ICD-10-CM

## 2024-09-03 ENCOUNTER — Telehealth: Payer: Self-pay

## 2024-09-03 NOTE — Telephone Encounter (Signed)
 She scheduled to come in on October 13th at 8:00 for labwork.

## 2024-09-10 ENCOUNTER — Other Ambulatory Visit

## 2024-09-10 DIAGNOSIS — E559 Vitamin D deficiency, unspecified: Secondary | ICD-10-CM

## 2024-09-10 DIAGNOSIS — R7303 Prediabetes: Secondary | ICD-10-CM

## 2024-09-10 DIAGNOSIS — E785 Hyperlipidemia, unspecified: Secondary | ICD-10-CM

## 2024-09-11 ENCOUNTER — Ambulatory Visit: Payer: Self-pay

## 2024-09-11 LAB — CBC WITH DIFFERENTIAL/PLATELET
Basophils Absolute: 0 x10E3/uL (ref 0.0–0.2)
Basos: 0 %
EOS (ABSOLUTE): 0.2 x10E3/uL (ref 0.0–0.4)
Eos: 2 %
Hematocrit: 38.5 % (ref 34.0–46.6)
Hemoglobin: 12.6 g/dL (ref 11.1–15.9)
Immature Grans (Abs): 0 x10E3/uL (ref 0.0–0.1)
Immature Granulocytes: 0 %
Lymphocytes Absolute: 1.7 x10E3/uL (ref 0.7–3.1)
Lymphs: 18 %
MCH: 31.5 pg (ref 26.6–33.0)
MCHC: 32.7 g/dL (ref 31.5–35.7)
MCV: 96 fL (ref 79–97)
Monocytes Absolute: 0.7 x10E3/uL (ref 0.1–0.9)
Monocytes: 8 %
Neutrophils Absolute: 6.8 x10E3/uL (ref 1.4–7.0)
Neutrophils: 72 %
Platelets: 413 x10E3/uL (ref 150–450)
RBC: 4 x10E6/uL (ref 3.77–5.28)
RDW: 11.7 % (ref 11.7–15.4)
WBC: 9.4 x10E3/uL (ref 3.4–10.8)

## 2024-09-11 LAB — LIPID PANEL
Chol/HDL Ratio: 2.5 ratio (ref 0.0–4.4)
Cholesterol, Total: 238 mg/dL — ABNORMAL HIGH (ref 100–199)
HDL: 96 mg/dL (ref >39)
LDL Chol Calc (NIH): 128 mg/dL — ABNORMAL HIGH (ref 0–99)
Triglycerides: 83 mg/dL (ref 0–149)
VLDL Cholesterol Cal: 14 mg/dL (ref 5–40)

## 2024-09-11 LAB — COMPREHENSIVE METABOLIC PANEL WITH GFR
ALT: 11 IU/L (ref 0–32)
AST: 17 IU/L (ref 0–40)
Albumin: 4.3 g/dL (ref 3.8–4.8)
Alkaline Phosphatase: 66 IU/L (ref 49–135)
BUN/Creatinine Ratio: 21 (ref 12–28)
BUN: 15 mg/dL (ref 8–27)
Bilirubin Total: 0.4 mg/dL (ref 0.0–1.2)
CO2: 25 mmol/L (ref 20–29)
Calcium: 9.8 mg/dL (ref 8.7–10.3)
Chloride: 103 mmol/L (ref 96–106)
Creatinine, Ser: 0.71 mg/dL (ref 0.57–1.00)
Globulin, Total: 2.9 g/dL (ref 1.5–4.5)
Glucose: 87 mg/dL (ref 70–99)
Potassium: 4 mmol/L (ref 3.5–5.2)
Sodium: 141 mmol/L (ref 134–144)
Total Protein: 7.2 g/dL (ref 6.0–8.5)
eGFR: 90 mL/min/1.73 (ref >59)

## 2024-09-11 LAB — HEMOGLOBIN A1C
Est. average glucose Bld gHb Est-mCnc: 123 mg/dL
Hgb A1c MFr Bld: 5.9 % — ABNORMAL HIGH (ref 4.8–5.6)

## 2024-09-11 LAB — VITAMIN D 25 HYDROXY (VIT D DEFICIENCY, FRACTURES): Vit D, 25-Hydroxy: 52.4 ng/mL (ref 30.0–100.0)

## 2024-09-11 LAB — TSH: TSH: 0.887 u[IU]/mL (ref 0.450–4.500)

## 2024-09-14 DIAGNOSIS — G8929 Other chronic pain: Secondary | ICD-10-CM

## 2024-09-14 MED ORDER — TRAMADOL HCL 50 MG PO TABS: 50.0000 mg | ORAL_TABLET | Freq: Three times a day (TID) | ORAL | 0 refills | Status: AC | PRN

## 2024-09-14 NOTE — Telephone Encounter (Signed)
 Received MyChart message requesting refill on Tramadol . Patient using Tramadol  intermittently for abdominal pain and chronic bilateral lower leg pain. Currently in PT for suspected parkinsonism, bradykinesia. PDMP reviewed during this encounter, no aberrancies.

## 2024-09-18 ENCOUNTER — Ambulatory Visit

## 2024-09-24 ENCOUNTER — Ambulatory Visit (INDEPENDENT_AMBULATORY_CARE_PROVIDER_SITE_OTHER)

## 2024-09-24 VITALS — BP 145/79 | HR 75 | Temp 98.0°F | Ht 59.0 in | Wt 99.1 lb

## 2024-09-24 DIAGNOSIS — E785 Hyperlipidemia, unspecified: Secondary | ICD-10-CM

## 2024-09-24 DIAGNOSIS — R7303 Prediabetes: Secondary | ICD-10-CM | POA: Diagnosis not present

## 2024-09-24 DIAGNOSIS — R627 Adult failure to thrive: Secondary | ICD-10-CM | POA: Diagnosis not present

## 2024-09-24 DIAGNOSIS — Z8619 Personal history of other infectious and parasitic diseases: Secondary | ICD-10-CM | POA: Diagnosis not present

## 2024-09-24 DIAGNOSIS — G20A1 Parkinson's disease without dyskinesia, without mention of fluctuations: Secondary | ICD-10-CM | POA: Diagnosis not present

## 2024-09-24 DIAGNOSIS — R042 Hemoptysis: Secondary | ICD-10-CM

## 2024-09-24 DIAGNOSIS — Z932 Ileostomy status: Secondary | ICD-10-CM

## 2024-09-24 DIAGNOSIS — K5180 Other ulcerative colitis without complications: Secondary | ICD-10-CM | POA: Diagnosis not present

## 2024-09-24 DIAGNOSIS — R21 Rash and other nonspecific skin eruption: Secondary | ICD-10-CM | POA: Diagnosis not present

## 2024-09-24 NOTE — Patient Instructions (Addendum)
 VISIT SUMMARY: During your visit, we discussed your ongoing weight loss, fatigue, and other symptoms. We also reviewed your history of strongyloides infection and pulmonary abscess, and addressed your current concerns including a facial rash, leg pain, and intermittent bloody phlegm.  YOUR PLAN: FAILURE TO THRIVE: You have continued to lose weight despite nutritional interventions, and you are experiencing fatigue, hair loss, and decreased appetite. -We will discuss the possibility of starting mirtazapine with your family to help stimulate your appetite and improve your sleep. - The office phone number for the registered dietician office is [phone]. Please give them a call to get the appointment set up.  STRONGYLOIDIASIS: You have a history of strongyloides infection, and there are concerns about a possible recurrence. -We will contact your GI specialist to expedite your appointment. -We will order tests to check for strongyloidiasis.  CHRONIC BLOODY PHLEGM: You have been experiencing intermittent bloody phlegm for a couple of years, with a history of lung abscess. -We discussed imaging, but it has been deferred for now.  FACIAL RASH: You have a rash on your forehead that is dry with white centers. -We recommend using CeraVe moisturizing cream.   LOWER EXTREMITY PAIN: You have leg pain and are using heat pads and physical therapy for relief. Tramadol  is used for pain, but it takes over an hour to provide relief. -Continue using heat pads and physical therapy for symptom relief. -Continue with vitamin B12 supplementation.  PREDIABETES: Your A1c level is 5.9, which is consistent with prediabetes. -Continue monitoring your blood sugar levels and maintain a healthy diet and exercise routine.  HYPERLIPIDEMIA: You have elevated cholesterol levels, but your cardiac risk is low. -We are deferring medication to avoid side effects.  If you have any problems before your next visit feel free to  message me via MyChart (minor issues or questions) or call the office, otherwise you may reach out to schedule an office visit.  Thank you! Saddie Sacks, PA-C

## 2024-09-26 ENCOUNTER — Telehealth: Payer: Self-pay

## 2024-09-26 ENCOUNTER — Other Ambulatory Visit: Payer: Self-pay

## 2024-09-26 DIAGNOSIS — Z8619 Personal history of other infectious and parasitic diseases: Secondary | ICD-10-CM

## 2024-09-26 NOTE — Telephone Encounter (Signed)
 Copied from CRM [id number]. Topic: Clinical - Request for Lab/Test Order >> Sep 25, 2024  4:50 PM Viola F wrote: Patient had lab appt scheduled for 10/30 - son [name] called to reschedule and wanted to know if patient needs to fast for labs but there are no lab orders in. Please call [name] at [phone] to get lab appt rescheduled once orders are in. He wants to bring the patient in on Friday 10/31.

## 2024-09-26 NOTE — Telephone Encounter (Signed)
 Patient requested additional lab work. The order is in so they can come Friday to have it done. Fasting is fine

## 2024-09-26 NOTE — Telephone Encounter (Signed)
 Called and spoke with patient's sonGLENWOOD Na. Lab appointment has been scheduled.

## 2024-09-27 ENCOUNTER — Other Ambulatory Visit

## 2024-09-27 DIAGNOSIS — R627 Adult failure to thrive: Secondary | ICD-10-CM | POA: Insufficient documentation

## 2024-09-27 DIAGNOSIS — R042 Hemoptysis: Secondary | ICD-10-CM | POA: Insufficient documentation

## 2024-09-27 DIAGNOSIS — Z8619 Personal history of other infectious and parasitic diseases: Secondary | ICD-10-CM | POA: Insufficient documentation

## 2024-09-27 NOTE — Assessment & Plan Note (Signed)
 A1c stable at 5.9 Will cont to monitor.

## 2024-09-27 NOTE — Assessment & Plan Note (Signed)
 Initially complained of intermittent tremor, hair loss, and fatigue on 08/27/2021. Workup at that time included CBC, A1c, CMP, TSH, vitamin D , vitamin B-12. All were within normal limits with the exception of vitamin D  which was low, and vitamin B12 which was high. Follows with neurology but not currently taking any anti-parkinsonism medications due to preference.

## 2024-09-27 NOTE — Assessment & Plan Note (Addendum)
 Previous treatment in 2009 with concerns about recurrence. Specialist involvement required. Discussed referral to ID  - Contact GI specialist to expedite appointment that is scheduled for December. - Will order igg strongyloides blood test per patient request.

## 2024-09-27 NOTE — Assessment & Plan Note (Signed)
 Intermittent pink colored phlegm that patient reports she coughs up every few weeks. Discussed importance of CT scan to rule out malignancy but patient deferred imaging at this time. She wants to wait until the [facility] testing is back as she believes this is the cause of her blood tinged sputum.

## 2024-09-27 NOTE — Assessment & Plan Note (Signed)
 Last lipid panel: LDL 128, HDL 96, Trig 83. The 10-year ASCVD risk score (Arnett DK, et al., 2019) is: 16.2%  Not open to statin medication at this time until her other symptoms are rectified. Will cont to monitor.

## 2024-09-27 NOTE — Assessment & Plan Note (Addendum)
 Weight loss of 104 to 99 lbs since her last OV despite nutritional interventions.  Symptoms include fatigue, hair loss, and decreased appetite. - Discuss mirtazapine with family for appetite stimulation and sleep improvement. - Referral is already placed for registered dietician consult. Recommend continuing high protein, high caloric diet.

## 2024-09-27 NOTE — Assessment & Plan Note (Signed)
 Follows with GI and ostomy specialist. Has already had follow ups scheduled for the year.

## 2024-09-27 NOTE — Progress Notes (Signed)
 Complete physical exam  Patient: [name]   DOB: Mar 06, 1951   73 y.o. Female  MRN: [id number]  Subjective:    Chief Complaint  Patient presents with   Annual Exam    Physical   History of Present Illness [name] is a 73 year old female with a history of strongyloides infection who presents with weight loss and fatigue. She is accompanied by her son-in-law, [name].   Unintentional weight loss and fatigue - Persistent weight loss despite improved nutrition and protein supplementation with nurse assistance - Lost four pounds since last visit - Loss of appetite, though increased protein and calorie intake - Sleep is variable, with some nights better than others - Fatigue present; intramuscular vitamin B12 injections weekly provide temporary improvement in energy. She is getting these injections done through a wellness clinic.   Cutaneous rash - Rash on forehead for approximately one week, resembling chicken pox with a white center - Rash sometimes pruritic - Similar rash present on legs, less prominent - No recent changes in skincare products or medications - Avoids steroid creams due to her hx of strongyloides infection exacerbated by steroid use (oral)  Lower extremity pain - Leg pain present - Uses heat pads and physical therapy for symptom relief - Tramadol  used for pain, but onset of relief is delayed by over an hour, causing frustration - Continues physical treatments and vitamin B12 supplementation  Hemoptysis - Bloody phlegm occurs every few weeks, ongoing for a couple of years - No associated pain with hemoptysis  History of strongyloides infection and pulmonary abscess - Strongyloides infection diagnosed in 2009 after positive test following two negatives - Treated with medication in 2009, resulting in weight gain (ivermectin) - Concern for recurrence due to current symptoms and environmental exposure [place] a [place] - Verbalized that she thinks that  another round of Ivermectin would help her gain weight, fix her rash, and reduce her stomach pain and is requesting Ivermectin Rx without testing today     Most recent fall risk assessment:    09/24/2024    3:24 PM  Fall Risk   Falls in the past year? 0  Follow up Falls evaluation completed     Most recent depression screenings:    09/24/2024    3:24 PM 06/27/2024    9:37 AM  PHQ 2/9 Scores  PHQ - 2 Score 0 0  PHQ- 9 Score 6 13        Patient Care Team: Gayle Saddie JULIANNA DEVONNA as PCP - General (Physician Assistant)   Outpatient Medications Prior to Visit  Medication Sig   betamethasone  valerate lotion (VALISONE ) 0.1 % APPLY 1 APPLICATION TOPICALLY 2 (TWO) TIMES DAILY.   cholestyramine  (QUESTRAN ) 4 g packet Take 1 packet (4 g total) by mouth 2 (two) times daily.   ondansetron  (ZOFRAN -ODT) 4 MG disintegrating tablet Take 1 tablet (4 mg total) by mouth every 8 (eight) hours as needed for nausea or vomiting.   pantoprazole  (PROTONIX ) 40 MG tablet TAKE 1 TABLET BY MOUTH TWICE A DAY   promethazine  (PHENERGAN ) 25 MG tablet Take 1 tablet (25 mg total) by mouth every 8 (eight) hours as needed for nausea or vomiting (At bedtime as needed).   No facility-administered medications prior to visit.    ROS  Per HPI       Objective:     BP (!) 145/79   Pulse 75   Temp 98 F (36.7 C) (Oral)   Ht 4' 11 (1.499 m)  Wt 99 lb 1.9 oz (45 kg)   SpO2 95%   BMI 20.02 kg/m    Physical Exam Constitutional:      General: She is not in acute distress.    Appearance: Normal appearance.  Cardiovascular:     Rate and Rhythm: Normal rate and regular rhythm.     Heart sounds: Normal heart sounds. No murmur heard.    No friction rub. No gallop.  Pulmonary:     Effort: Pulmonary effort is normal. No respiratory distress.     Breath sounds: Normal breath sounds.  Musculoskeletal:        General: No swelling.     Cervical back: Neck supple.  Lymphadenopathy:     Cervical: No cervical  adenopathy.  Skin:    General: Skin is warm and dry.     Comments: Red, flat macules that are flaking present on forehead   Neurological:     General: No focal deficit present.     Mental Status: She is alert.  Psychiatric:        Mood and Affect: Mood normal.        Behavior: Behavior normal.        Thought Content: Thought content normal.       No results found for any visits on 09/24/24. Last CBC Lab Results  Component Value Date   WBC 9.4 09/10/2024   HGB 12.6 09/10/2024   HCT 38.5 09/10/2024   MCV 96 09/10/2024   MCH 31.5 09/10/2024   RDW 11.7 09/10/2024   PLT 413 09/10/2024   Last metabolic panel Lab Results  Component Value Date   GLUCOSE 87 09/10/2024   NA 141 09/10/2024   K 4.0 09/10/2024   CL 103 09/10/2024   CO2 25 09/10/2024   BUN 15 09/10/2024   CREATININE 0.71 09/10/2024   EGFR 90 09/10/2024   CALCIUM 9.8 09/10/2024   PHOS 3.0 06/27/2024   PROT 7.2 09/10/2024   ALBUMIN 4.3 09/10/2024   LABGLOB 2.9 09/10/2024   AGRATIO 1.4 04/18/2023   BILITOT 0.4 09/10/2024   ALKPHOS 66 09/10/2024   AST 17 09/10/2024   ALT 11 09/10/2024   ANIONGAP 11 06/23/2024   Last lipids Lab Results  Component Value Date   CHOL 238 (H) 09/10/2024   HDL 96 09/10/2024   LDLCALC 128 (H) 09/10/2024   LDLDIRECT 112.8 10/15/2008   TRIG 83 09/10/2024   CHOLHDL 2.5 09/10/2024   Last hemoglobin A1c Lab Results  Component Value Date   HGBA1C 5.9 (H) 09/10/2024   Last thyroid  functions Lab Results  Component Value Date   TSH 0.887 09/10/2024   T3TOTAL 110 08/27/2021   T4TOTAL 8.4 03/29/2023   FREET4 1.24 09/06/2023   Last vitamin D  Lab Results  Component Value Date   VD25OH 52.4 09/10/2024        Assessment & Plan:    Routine Health Maintenance and Physical Exam  Health Maintenance  Topic Date Due   Medicare Annual Wellness Visit  10/29/2022   COVID-19 Vaccine (1 - 2025-26 season) 10/10/2024*   Zoster (Shingles) Vaccine (1 of 2) 12/25/2024*   Flu Shot   02/26/2025*   DTaP/Tdap/Td vaccine (2 - Tdap) 09/24/2025*   Pneumococcal Vaccine for age over 108 (1 of 1 - PCV) 09/24/2025*   Breast Cancer Screening  09/24/2025*   DEXA scan (bone density measurement)  09/24/2025*   Hepatitis C Screening  Completed   Meningitis B Vaccine  Aged Out  *Topic was postponed. The date shown is not  the original due date.    Discussed health benefits of physical activity, and encouraged her to engage in regular exercise appropriate for her age and condition.  FTT (failure to thrive) in adult Assessment & Plan: Weight loss of 104 to 99 lbs since her last OV despite nutritional interventions.  Symptoms include fatigue, hair loss, and decreased appetite. - Discuss mirtazapine with family for appetite stimulation and sleep improvement. - Referral is already placed for registered dietician consult. Recommend continuing high protein, high caloric diet.    History of strongyloidiasis Assessment & Plan: Previous treatment in 2009 with concerns about recurrence. Specialist involvement required. Discussed referral to ID  - Contact GI specialist to expedite appointment that is scheduled for December. - Will order igg strongyloides blood test per patient request.     Blood-tinged sputum Assessment & Plan: Intermittent pink colored phlegm that patient reports she coughs up every few weeks. Discussed importance of CT scan to rule out malignancy but patient deferred imaging at this time. She wants to wait until the [facility] testing is back as she believes this is the cause of her blood tinged sputum.    Other ulcerative colitis without complication (HCC)  Ileostomy status (HCC) Assessment & Plan: Follows with GI and ostomy specialist. Has already had follow ups scheduled for the year.   Pre-diabetes Assessment & Plan: A1c stable at 5.9 Will cont to monitor.   Parkinson's disease, unspecified whether dyskinesia present, unspecified whether manifestations  fluctuate (HCC) Assessment & Plan: Initially complained of intermittent tremor, hair loss, and fatigue on 08/27/2021. Workup at that time included CBC, A1c, CMP, TSH, vitamin D , vitamin B-12. All were within normal limits with the exception of vitamin D  which was low, and vitamin B12 which was high. Follows with neurology but not currently taking any anti-parkinsonism medications due to preference.   Dyslipidemia, goal LDL below 100 Assessment & Plan: Last lipid panel: LDL 128, HDL 96, Trig 83. The 10-year ASCVD risk score (Arnett DK, et al., 2019) is: 16.2%  Not open to statin medication at this time until her other symptoms are rectified. Will cont to monitor.   Facial rash Assessment & Plan: Rash on forehead, dry with white centers. Patient prefers to avoid steroid creams due to strongyloidiasis history. Explained that steroid creams are generally safe in this sense.  - Recommend CeraVe moisturizing cream.    I have personally spent 60 minutes involved in face-to-face and non-face-to-face activities for this patient on the day of the visit. Professional time spent includes the following activities: Preparing to see the patient (review of tests), Obtaining and/or reviewing separately obtained history (admission/discharge record), Performing a medically appropriate examination and/or evaluation , Ordering medications/tests/procedures, referring and communicating with other health care professionals, Documenting clinical information in the EMR, Independently interpreting results (not separately reported), Communicating results to the patient/family/caregiver, Counseling and educating the patient/family/caregiver and Care coordination (not separately reported).  Total time spent in patient room: 47 minutes.  Return in about 4 months (around 01/25/2025) for Med check.     Saddie JULIANNA Sacks, PA-C

## 2024-09-27 NOTE — Assessment & Plan Note (Signed)
 Rash on forehead, dry with white centers. Patient prefers to avoid steroid creams due to strongyloidiasis history. Explained that steroid creams are generally safe in this sense.  - Recommend CeraVe moisturizing cream.

## 2024-09-28 ENCOUNTER — Other Ambulatory Visit

## 2024-09-28 DIAGNOSIS — Z8619 Personal history of other infectious and parasitic diseases: Secondary | ICD-10-CM | POA: Diagnosis not present

## 2024-10-01 ENCOUNTER — Ambulatory Visit: Payer: Self-pay

## 2024-10-01 LAB — STRONGYLOIDES, AB, IGG: Strongyloides, Ab, IgG: NEGATIVE

## 2024-10-22 DIAGNOSIS — Z8639 Personal history of other endocrine, nutritional and metabolic disease: Secondary | ICD-10-CM

## 2024-11-08 NOTE — Telephone Encounter (Signed)
 That's fine -- Patient does have hx of mild hyperthroidism but this was several years ago (2018). She messaged me requesting a referral, however if they office does not feel they need to see her due to most recent thyroid  tests being normal, that is reasonable.

## 2024-11-08 NOTE — Telephone Encounter (Signed)
 Copied from CRM #[id number]. Topic: Referral - Question >> Nov 08, 2024  9:07 AM Leonette SQUIBB wrote: Reason for CRM: Temeka with Endocrinology at [facility] saying they received a referral for this patient but there is no mention of her having thyroid  problems.  [phone]

## 2024-11-12 ENCOUNTER — Telehealth: Payer: Self-pay

## 2024-11-15 ENCOUNTER — Telehealth: Payer: Self-pay

## 2024-11-15 ENCOUNTER — Other Ambulatory Visit

## 2024-11-15 DIAGNOSIS — R627 Adult failure to thrive: Secondary | ICD-10-CM

## 2024-11-15 DIAGNOSIS — Z8639 Personal history of other endocrine, nutritional and metabolic disease: Secondary | ICD-10-CM

## 2024-11-15 NOTE — Telephone Encounter (Signed)
 Called and LVM(home) and with son GLENWOOD Na. Need to schedule lab appointment.

## 2024-11-15 NOTE — Telephone Encounter (Signed)
 Pt came in office for more labs pertaining to this will await for those results

## 2024-11-16 NOTE — Telephone Encounter (Signed)
 Error

## 2024-11-18 MED ORDER — METHOCARBAMOL 500 MG PO TABS: 500.0000 mg | ORAL_TABLET | Freq: Four times a day (QID) | ORAL | 0 refills | Status: DC | PRN

## 2024-11-19 ENCOUNTER — Other Ambulatory Visit

## 2024-11-23 ENCOUNTER — Ambulatory Visit: Payer: Self-pay

## 2024-11-24 ENCOUNTER — Emergency Department

## 2024-11-24 ENCOUNTER — Other Ambulatory Visit: Payer: Self-pay

## 2024-11-24 ENCOUNTER — Observation Stay
Admission: EM | Admit: 2024-11-24 | Discharge: 2024-11-28 | Disposition: A | Discharge status: Home Health | Attending: Student | Admitting: Student

## 2024-11-24 DIAGNOSIS — M79604 Pain in right leg: Secondary | ICD-10-CM

## 2024-11-24 DIAGNOSIS — R197 Diarrhea, unspecified: Secondary | ICD-10-CM | POA: Diagnosis present

## 2024-11-24 DIAGNOSIS — R531 Weakness: Principal | ICD-10-CM | POA: Insufficient documentation

## 2024-11-24 DIAGNOSIS — K435 Parastomal hernia without obstruction or  gangrene: Secondary | ICD-10-CM | POA: Diagnosis not present

## 2024-11-24 DIAGNOSIS — K8020 Calculus of gallbladder without cholecystitis without obstruction: Secondary | ICD-10-CM | POA: Diagnosis not present

## 2024-11-24 DIAGNOSIS — Z8719 Personal history of other diseases of the digestive system: Secondary | ICD-10-CM | POA: Diagnosis not present

## 2024-11-24 DIAGNOSIS — N289 Disorder of kidney and ureter, unspecified: Secondary | ICD-10-CM | POA: Diagnosis not present

## 2024-11-24 DIAGNOSIS — K9419 Other complications of enterostomy: Secondary | ICD-10-CM | POA: Insufficient documentation

## 2024-11-24 DIAGNOSIS — M79659 Pain in unspecified thigh: Secondary | ICD-10-CM | POA: Diagnosis present

## 2024-11-24 DIAGNOSIS — Z9049 Acquired absence of other specified parts of digestive tract: Secondary | ICD-10-CM | POA: Diagnosis not present

## 2024-11-24 DIAGNOSIS — G20C Parkinsonism, unspecified: Secondary | ICD-10-CM | POA: Diagnosis present

## 2024-11-24 DIAGNOSIS — E440 Moderate protein-calorie malnutrition: Secondary | ICD-10-CM | POA: Diagnosis not present

## 2024-11-24 DIAGNOSIS — R600 Localized edema: Secondary | ICD-10-CM | POA: Insufficient documentation

## 2024-11-24 DIAGNOSIS — M79652 Pain in left thigh: Secondary | ICD-10-CM | POA: Insufficient documentation

## 2024-11-24 DIAGNOSIS — R1030 Lower abdominal pain, unspecified: Secondary | ICD-10-CM

## 2024-11-24 DIAGNOSIS — H532 Diplopia: Principal | ICD-10-CM | POA: Diagnosis present

## 2024-11-24 DIAGNOSIS — E785 Hyperlipidemia, unspecified: Secondary | ICD-10-CM | POA: Diagnosis present

## 2024-11-24 DIAGNOSIS — M79651 Pain in right thigh: Secondary | ICD-10-CM | POA: Insufficient documentation

## 2024-11-24 DIAGNOSIS — Z932 Ileostomy status: Secondary | ICD-10-CM | POA: Insufficient documentation

## 2024-11-24 DIAGNOSIS — R7303 Prediabetes: Secondary | ICD-10-CM | POA: Insufficient documentation

## 2024-11-24 DIAGNOSIS — R9341 Abnormal radiologic findings on diagnostic imaging of renal pelvis, ureter, or bladder: Secondary | ICD-10-CM | POA: Insufficient documentation

## 2024-11-24 DIAGNOSIS — R109 Unspecified abdominal pain: Secondary | ICD-10-CM | POA: Diagnosis not present

## 2024-11-24 LAB — COMPREHENSIVE METABOLIC PANEL WITH GFR
ALT: 15 U/L (ref 0–44)
AST: 20 U/L (ref 15–41)
Albumin: 4.1 g/dL (ref 3.5–5.0)
Alkaline Phosphatase: 51 U/L (ref 38–126)
Anion gap: 13 (ref 5–15)
BUN: 18 mg/dL (ref 8–23)
CO2: 23 mmol/L (ref 22–32)
Calcium: 9.4 mg/dL (ref 8.9–10.3)
Chloride: 104 mmol/L (ref 98–111)
Creatinine, Ser: 0.62 mg/dL (ref 0.44–1.00)
GFR, Estimated: >60 mL/min
Glucose, Bld: 92 mg/dL (ref 70–99)
Potassium: 4.1 mmol/L (ref 3.5–5.1)
Sodium: 140 mmol/L (ref 135–145)
Total Bilirubin: 0.5 mg/dL (ref 0.0–1.2)
Total Protein: 7 g/dL (ref 6.5–8.1)

## 2024-11-24 LAB — URINALYSIS, ROUTINE W REFLEX MICROSCOPIC
Bilirubin Urine: NEGATIVE
Glucose, UA: NEGATIVE mg/dL
Hgb urine dipstick: NEGATIVE
Ketones, ur: NEGATIVE mg/dL
Leukocytes,Ua: NEGATIVE
Nitrite: NEGATIVE
Protein, ur: NEGATIVE mg/dL
Specific Gravity, Urine: 1.029 (ref 1.005–1.030)
pH: 7 (ref 5.0–8.0)

## 2024-11-24 LAB — LIPASE, BLOOD: Lipase: 35 U/L (ref 11–51)

## 2024-11-24 LAB — CBC
HCT: 36 % (ref 36.0–46.0)
Hemoglobin: 11.8 g/dL — ABNORMAL LOW (ref 12.0–15.0)
MCH: 31.3 pg (ref 26.0–34.0)
MCHC: 32.8 g/dL (ref 30.0–36.0)
MCV: 95.5 fL (ref 80.0–100.0)
Platelets: 391 K/uL (ref 150–400)
RBC: 3.77 MIL/uL — ABNORMAL LOW (ref 3.87–5.11)
RDW: 12.8 % (ref 11.5–15.5)
WBC: 8.1 K/uL (ref 4.0–10.5)
nRBC: 0 % (ref 0.0–0.2)

## 2024-11-24 LAB — PRO BRAIN NATRIURETIC PEPTIDE: Pro Brain Natriuretic Peptide: 96.1 pg/mL

## 2024-11-24 LAB — LACTIC ACID, PLASMA: Lactic Acid, Venous: 0.7 mmol/L (ref 0.5–1.9)

## 2024-11-24 LAB — TROPONIN T, HIGH SENSITIVITY: Troponin T High Sensitivity: <15 ng/L (ref 0–19)

## 2024-11-24 LAB — CK: Total CK: 75 U/L (ref 38–234)

## 2024-11-24 MED ORDER — OXYCODONE HCL 5 MG PO TABS
5.0000 mg | ORAL_TABLET | Freq: Once | ORAL | Status: AC
  Administered 2024-11-24: 5 mg via ORAL
  Filled 2024-11-24: qty 1

## 2024-11-24 MED ORDER — SODIUM CHLORIDE 0.9 % IV BOLUS
500.0000 mL | Freq: Once | INTRAVENOUS | Status: AC
  Administered 2024-11-24: 500 mL via INTRAVENOUS

## 2024-11-24 MED ORDER — ASPIRIN 325 MG PO TBEC
325.0000 mg | DELAYED_RELEASE_TABLET | Freq: Once | ORAL | Status: AC
  Administered 2024-11-25: 325 mg via ORAL
  Filled 2024-11-24: qty 1

## 2024-11-24 MED ORDER — ONDANSETRON HCL 4 MG/2ML IJ SOLN: 4.0000 mg | Freq: Three times a day (TID) | INTRAMUSCULAR | Status: DC | PRN

## 2024-11-24 MED ORDER — PREDNISONE 20 MG PO TABS
40.0000 mg | ORAL_TABLET | Freq: Once | ORAL | Status: AC
  Administered 2024-11-25: 40 mg via ORAL
  Filled 2024-11-24: qty 2

## 2024-11-24 MED ORDER — PREDNISONE 20 MG PO TABS: 50.0000 mg | ORAL_TABLET | Freq: Once | ORAL | Status: DC

## 2024-11-24 MED ORDER — SODIUM CHLORIDE 0.9 % IV SOLN: INTRAVENOUS | Status: DC

## 2024-11-24 MED ORDER — KETOROLAC TROMETHAMINE 15 MG/ML IJ SOLN
15.0000 mg | Freq: Once | INTRAMUSCULAR | Status: AC
  Administered 2024-11-24: 15 mg via INTRAVENOUS
  Filled 2024-11-24: qty 1

## 2024-11-24 MED ORDER — IOHEXOL 300 MG/ML  SOLN
75.0000 mL | Freq: Once | INTRAMUSCULAR | Status: AC | PRN
  Administered 2024-11-24: 75 mL via INTRAVENOUS

## 2024-11-24 MED ORDER — ACETAMINOPHEN 500 MG PO TABS
1000.0000 mg | ORAL_TABLET | Freq: Once | ORAL | Status: AC
  Administered 2024-11-24: 1000 mg via ORAL
  Filled 2024-11-24: qty 2

## 2024-11-24 MED ORDER — OXYCODONE-ACETAMINOPHEN 5-325 MG PO TABS
1.0000 | ORAL_TABLET | ORAL | Status: DC | PRN
  Administered 2024-11-25 – 2024-11-26 (×5): 1 via ORAL
  Filled 2024-11-24 (×3): qty 1

## 2024-11-24 MED ORDER — HYDROMORPHONE HCL 1 MG/ML IJ SOLN
0.5000 mg | Freq: Once | INTRAMUSCULAR | Status: AC
  Administered 2024-11-24: 0.5 mg via INTRAVENOUS
  Filled 2024-11-24: qty 0.5

## 2024-11-24 MED ORDER — ENOXAPARIN SODIUM 30 MG/0.3ML IJ SOSY: 30.0000 mg | PREFILLED_SYRINGE | INTRAMUSCULAR | Status: DC

## 2024-11-24 MED ORDER — STROKE: EARLY STAGES OF RECOVERY BOOK: Freq: Once | Status: DC

## 2024-11-24 MED ORDER — FENTANYL CITRATE (PF) 50 MCG/ML IJ SOSY
50.0000 ug | PREFILLED_SYRINGE | Freq: Once | INTRAMUSCULAR | Status: AC
  Administered 2024-11-24: 50 ug via INTRAVENOUS
  Filled 2024-11-24: qty 1

## 2024-11-24 MED ORDER — ACETAMINOPHEN 325 MG PO TABS
650.0000 mg | ORAL_TABLET | Freq: Four times a day (QID) | ORAL | Status: DC | PRN
  Administered 2024-11-25: 650 mg via ORAL
  Filled 2024-11-24: qty 2

## 2024-11-24 MED ORDER — MORPHINE SULFATE (PF) 2 MG/ML IV SOLN
2.0000 mg | INTRAVENOUS | Status: DC | PRN
  Administered 2024-11-25 (×3): 2 mg via INTRAVENOUS
  Filled 2024-11-24 (×3): qty 1

## 2024-11-24 NOTE — H&P (Signed)
 " History and Physical    [name] FMW:[id number] DOB: Apr 18, 1951 DOA: 11/24/2024  Referring MD/NP/PA:   PCP: Gayle Saddie FALCON, PA-C   Patient coming from:  The patient is coming from home.     Chief Complaint: Lower abdominal pain, loose stool, bilateral upper leg pain, Double vision  HPI: [name] is a 73 y.o. female with medical history significant of ulcerative colitis (s/p of total colectomy and s/p of ileostomy), HLD, prediabetes, parkinsonism, strongyloidiasis, GERD, who presents with lower abdominal pain, loose stool, bilateral upper leg pain, double vision.  Per patient and her son at the bedside, she started having lower abdominal pain since this morning, which was mild, aching, nonradiating, not aggravated or alleviated by no known factors.  Associated with nausea and watery stool in ileostomy bag, no vomiting.  No fever or chills.  She also reports bilateral upper leg pain, which is constant, aching, severe, nonradiating, not aggravated or alleviated by any known factors. She has redness in all toes on my examination, but no toe pain.  Denies any injury.  Denies lower back pain or back injury.  Patient does not have chest pain, cough, SOB.  Denies symptoms of UTI.  Patient states that she has intermittent double vision for more than 1 month.  Denies unilateral numbness or tingling in extremities.  No facial droop or slurred speech.  She has poor balance.  No vision loss.  She cannot tell which eye has double vision.  Currently no double vision.  She has resting hand tremor.   Data reviewed independently and ED Course: pt was found to have WBC 8.1, GFR > 60, BNP 96.1, negative UA, troponin<15, lactic acid 0.7, lipase 35, liver function normal, CK 75.  Negative chest x-ray.  CT of head negative for acute intracranial abnormalities.  Lower extremity venous Doppler negative for DVT, but showed left Baker's cyst.  Patient is placed in telemetry bed for observation.  CT of  abdomen/pelvis: 1. No acute findings in the abdomen or pelvis. 2. Large peristomal hernia containing nondilated bowel, unchanged from prior. 3. Stable 1 cm mildly hyperdense left renal lesion, incompletely characterized, recommend renal protocol MRI (preferred) or CT without and with contrast in 6-12 months. 4. Cholelithiasis.    Lower extremity venous Doppler: 1. No evidence of deep venous thrombosis in the left lower extremity. 2. Baker cyst in the left popliteal fossa measuring 3.7 x 1.1 x 1.2 cm.   EKG: I have personally reviewed.  Sinus rhythm, QTc 428, borderline left axis deviation.   Review of Systems:   General: no fevers, chills, no body weight gain, has poor appetite, has fatigue HEENT: no blurry vision, hearing changes or sore throat Respiratory: no dyspnea, coughing, wheezing CV: no chest pain, no palpitations GI: Has nausea, lower abdominal pain, loose stool in bags.  GU: no dysuria, burning on urination, increased urinary frequency, hematuria  Ext: no leg edema.  Neuro: no unilateral weakness, numbness, or tingling, no hearing loss.  Has resting hand tremor.  Has double vision Skin: no rash, no skin tear. MSK: No muscle spasm, no deformity, no limitation of range of movement in spin.  Has bilateral upper leg pain Heme: No easy bruising.  Travel history: No recent long distant travel.   Allergy : Allergies[1]  Past Medical History:  Diagnosis Date   Anorexia    Strongyloidiasis 04/24/2009   Qualifier: Diagnosis of   By: Antonio ROSALEA Rockers         Ulcerative colitis  Past Surgical History:  Procedure Laterality Date   CESAREAN SECTION     COLECTOMY     ILEOSTOMY  5/09   Dr.Ingram --drained abscess and ileostomy    Social History:  reports that she has never smoked. She has never used smokeless tobacco. She reports that she does not drink alcohol and does not use drugs.  Family History:  Family History  Problem Relation Age of Onset   Stroke Father     Hypertension Father    Hyperlipidemia Father    Colitis Sister    Crohn's disease Sister    Stroke Brother    Hypertension Brother    Hyperlipidemia Brother    Hypertension Paternal Grandmother    Stroke Paternal Grandmother      Prior to Admission medications  Medication Sig Start Date End Date Taking? Authorizing Provider  betamethasone  valerate lotion (VALISONE ) 0.1 % APPLY 1 APPLICATION TOPICALLY 2 (TWO) TIMES DAILY. 12/08/23   Wallace Search A, PA  cholestyramine  (QUESTRAN ) 4 g packet Take 1 packet (4 g total) by mouth 2 (two) times daily. 04/14/24   Unk Corinn Skiff, MD  methocarbamol  (ROBAXIN ) 500 MG tablet Take 1 tablet (500 mg total) by mouth every 6 (six) hours as needed for muscle spasms. 11/18/24   Gayle Saddie FALCON, PA-C  ondansetron  (ZOFRAN -ODT) 4 MG disintegrating tablet Take 1 tablet (4 mg total) by mouth every 8 (eight) hours as needed for nausea or vomiting. 06/27/24   Gayle Saddie FALCON, PA-C  pantoprazole  (PROTONIX ) 40 MG tablet TAKE 1 TABLET BY MOUTH TWICE A DAY 07/04/24   Clapp, Kara F, PA-C  promethazine  (PHENERGAN ) 25 MG tablet Take 1 tablet (25 mg total) by mouth every 8 (eight) hours as needed for nausea or vomiting (At bedtime as needed). 06/27/24   Gayle Saddie FALCON, PA-C    Physical Exam: Vitals:   11/24/24 2200 11/24/24 2300 11/24/24 2323 11/25/24 0037  BP: (!) 168/64 (!) 154/91 (!) 147/88   Pulse: 93 96 95   Resp: 20 19 20    Temp:   97.7 F (36.5 C)   TempSrc:      SpO2: 97% 98% 97%   Weight:    44.4 kg  Height:    4' 11 (1.499 m)   General: Not in acute distress HEENT:       Eyes: PERRL, EOMI, no jaundice       ENT: No discharge from the ears and nose, no pharynx injection, no tonsillar enlargement.        Neck: No JVD, no bruit, no mass felt. Heme: No neck lymph node enlargement. Cardiac: S1/S2, RRR, No murmurs, No gallops or rubs. Respiratory: No rales, wheezing, rhonchi or rubs. GI: Soft, nondistended, nontender, no rebound pain, no organomegaly, BS  present.  Has ileostomy bag in place GU: No hematuria Ext: No pitting leg edema bilaterally. 1+DP/PT pulse bilaterally.  All toes are erythematous, but no pain or warmth. Musculoskeletal: No joint deformities, No joint redness or warmth, no limitation of ROM in spin. Skin: No rashes.  Neuro: Alert, oriented X3, cranial nerves II-XII grossly intact, moves all extremities.  Has resting hand tremor Psych: Patient is not psychotic, no suicidal or hemocidal ideation.  Labs on Admission: I have personally reviewed following labs and imaging studies  CBC: Recent Labs  Lab 11/24/24 1343  WBC 8.1  HGB 11.8*  HCT 36.0  MCV 95.5  PLT 391   Basic Metabolic Panel: Recent Labs  Lab 11/24/24 1343  NA 140  K 4.1  CL 104  CO2 23  GLUCOSE 92  BUN 18  CREATININE 0.62  CALCIUM 9.4   GFR: Estimated Creatinine Clearance: 42.7 mL/min (by C-G formula based on SCr of 0.62 mg/dL). Liver Function Tests: Recent Labs  Lab 11/24/24 1343  AST 20  ALT 15  ALKPHOS 51  BILITOT 0.5  PROT 7.0  ALBUMIN 4.1   Recent Labs  Lab 11/24/24 1343  LIPASE 35   No results for input(s): AMMONIA in the last 168 hours. Coagulation Profile: No results for input(s): INR, PROTIME in the last 168 hours. Cardiac Enzymes: Recent Labs  Lab 11/24/24 1343  CKTOTAL 75   BNP (last 3 results) Recent Labs    11/24/24 1343  PROBNP 96.1   HbA1C: No results for input(s): HGBA1C in the last 72 hours. CBG: No results for input(s): GLUCAP in the last 168 hours. Lipid Profile: No results for input(s): CHOL, HDL, LDLCALC, TRIG, CHOLHDL, LDLDIRECT in the last 72 hours. Thyroid  Function Tests: No results for input(s): TSH, T4TOTAL, FREET4, T3FREE, THYROIDAB in the last 72 hours. Anemia Panel: No results for input(s): VITAMINB12, FOLATE, FERRITIN, TIBC, IRON, RETICCTPCT in the last 72 hours. Urine analysis:    Component Value Date/Time   COLORURINE STRAW (A)  11/24/2024 1950   APPEARANCEUR CLEAR (A) 11/24/2024 1950   LABSPEC 1.029 11/24/2024 1950   PHURINE 7.0 11/24/2024 1950   GLUCOSEU NEGATIVE 11/24/2024 1950   HGBUR NEGATIVE 11/24/2024 1950   HGBUR small 10/07/2009 1123   BILIRUBINUR NEGATIVE 11/24/2024 1950   BILIRUBINUR negative 09/06/2023 0940   BILIRUBINUR neg 07/19/2011 1152   KETONESUR NEGATIVE 11/24/2024 1950   PROTEINUR NEGATIVE 11/24/2024 1950   UROBILINOGEN 0.2 09/06/2023 0940   UROBILINOGEN 0.2 11/09/2010 1757   NITRITE NEGATIVE 11/24/2024 1950   LEUKOCYTESUR NEGATIVE 11/24/2024 1950   Sepsis Labs: @LABRCNTIP (procalcitonin:4,lacticidven:4) ) Recent Results (from the past 240 hours)  C Difficile Quick Screen w PCR reflex     Status: None   Collection Time: 11/24/24 11:47 PM   Specimen: STOOL  Result Value Ref Range Status   C Diff antigen NEGATIVE NEGATIVE Final   C Diff toxin NEGATIVE NEGATIVE Final   C Diff interpretation No C. difficile detected.  Final    Comment: Performed at [facility], [address] [place], [place] [address]     Radiological Exams on Admission:   Assessment/Plan Principal Problem:   Double vision Active Problems:   Diarrhea   Abdominal pain   Upper leg pain   HLD (hyperlipidemia)   Kidney lesion, native, left   Parkinsonism (HCC)   Assessment and Plan:  Double vision: Patient has intermittent double vision for 1 month.  Has poor balance.  Will need to rule out stroke.  CT of head negative for acute intracranial abnormalities.  -Placed on tele bed for observation - will get MRI-brain. If MRI is positive for stroke, then will need to complete stroke work up. - give 325 of ASA nopw - fasting lipid panel and HbA1c  - swallowing screen. If fails, will get SLP - PT/OT consult  Diarrhea and abdominal pain: CT of abdomen/pelvis is negative for acute intraabdominal issues.  Liver function and lipase normal. - Check CT for GI pathogen panel - As needed morphine , Tylenol ,  Percocet for pain - As needed Zofran  for nausea vomiting  Upper leg pain: Patient has bilateral upper leg pain.  Etiology is not today.  No fall or injury.  No erythema on examination.  CK level normal 75.  Lower extremity venous  Doppler negative for DVT.  Patient has erythema in all toes, but no toe pain or warmth.  Does not seem to have cellulitis.  Unclear etiology, ? Sausage toe, but no toe pain. -check ESR, CRP, uric acid level - Pain control as above - Give 40 mg prednisone  empirically  HLD (hyperlipidemia): Patient states that she is not taking medications currently. - Follow-up FLP  Kidney lesion, native, left: This is incidental findings by CT scan. -Follow-up with PCP for outpatient workup  Parkinsonism [facility]) -Fall precaution     DVT ppx:  SQ Lovenox   Code Status: Full code   Family Communication:  Yes, patient's son  at bed side.     Disposition Plan:  Anticipate discharge back to previous environment  Consults called:  none  Admission status and Level of care: Telemetry:    for obs       Dispo: The patient is from: Home              Anticipated d/c is to: Home              Anticipated d/c date is: 1 day              Patient currently is medically stable to d/c.    Severity of Illness:  The appropriate patient status for this patient is OBSERVATION. Observation status is judged to be reasonable and necessary in order to provide the required intensity of service to ensure the patient's safety. The patient's presenting symptoms, physical exam findings, and initial radiographic and laboratory data in the context of their medical condition is felt to place them at decreased risk for further clinical deterioration. Furthermore, it is anticipated that the patient will be medically stable for discharge from the hospital within 2 midnights of admission.        Date of Service 11/25/2024    Caleb Exon [facility]   If 7PM-7AM, please contact  night-coverage [email or web address] 11/25/2024, 1:38 AM     [1] No Known Allergies  "

## 2024-11-24 NOTE — ED Provider Notes (Addendum)
 "  [facility] Provider Note    Event Date/Time   First MD Initiated Contact with Patient 11/24/24 1548     (approximate)   History   Abdominal Pain and Weakness   HPI  [name] is a 73 y.o. female who comes in from home for lower abdominal pain for the past 2 or 3 days.  Patient's history of ulcerative colitis.  She has a ileostomy bag in place.  Patient reports months of abdominal discomfort secondary to her known ulcerative colitis but she reports acutely worsening pain in her lower abdomen radiating into her legs over the past couple of days.  She reports today having a little bit of increased weakness.  They do report a little bit of blurred vision over the past month but she denies any currently, but reports it beingdouble vision.  They do report a little bit of edema noted in her legs as well.     Physical Exam   Triage Vital Signs: ED Triage Vitals [11/24/24 1344]  Encounter Vitals Group     BP 121/73     Girls Systolic BP Percentile      Girls Diastolic BP Percentile      Boys Systolic BP Percentile      Boys Diastolic BP Percentile      Pulse Rate 80     Resp 18     Temp 98 F (36.7 C)     Temp Source Oral     SpO2 98 %     Weight      Height      Head Circumference      Peak Flow      Pain Score 5     Pain Loc      Pain Education      Exclude from Growth Chart     Most recent vital signs: Vitals:   11/24/24 1344  BP: 121/73  Pulse: 80  Resp: 18  Temp: 98 F (36.7 C)  SpO2: 98%     General: Awake, no distress.  CV:  Good peripheral perfusion.  Resp:  Normal effort.  Abd:  No distention.  Ileostomy bag in place with some tenderness noted but no rebound, no guarding Other:  Good distal pulses but some mild edema noted in the feet bilaterally.   ED Results / Procedures / Treatments   Labs (all labs ordered are listed, but only abnormal results are displayed) Labs Reviewed  CBC - Abnormal; Notable for the  following components:      Result Value   RBC 3.77 (*)    Hemoglobin 11.8 (*)    All other components within normal limits  LIPASE, BLOOD  COMPREHENSIVE METABOLIC PANEL WITH GFR  URINALYSIS, ROUTINE W REFLEX MICROSCOPIC     EKG  My interpretation of EKG:  Normal sinus rate of 85 without any ST elevation or T wave inversions, normal intervals  RADIOLOGY I have reviewed the ct personally and interpreted no evidence of intercranial hemorrhage   PROCEDURES:  Critical Care performed: No  Procedures   MEDICATIONS ORDERED IN ED: Medications  fentaNYL  (SUBLIMAZE ) injection 50 mcg (has no administration in time range)  oxyCODONE  (Oxy IR/ROXICODONE ) immediate release tablet 5 mg (5 mg Oral Given 11/24/24 1626)  sodium chloride  0.9 % bolus 500 mL (500 mLs Intravenous New Bag/Given 11/24/24 1655)  iohexol  (OMNIPAQUE ) 300 MG/ML solution 75 mL (75 mLs Intravenous Contrast Given 11/24/24 1740)     IMPRESSION / MDM / ASSESSMENT AND PLAN /  ED COURSE  I reviewed the triage vital signs and the nursing notes.   Patient's presentation is most consistent with acute presentation with potential threat to life or bodily function.   Patient comes in with weakness, abdominal pain.  CT imaging was already done in triage to evaluate for intercranial hemorrhage, mass, stroke.  However I will order a CT abdomen pelvis to rule out any type of obstruction as ultrasounds of her legs evaluate for DVT will get cardiac markers to evaluate for ACS and CK to evaluate for rhabdo will give some treatment with oxycodone , fluids and reevaluate patient    CBC is reassuring.  Lipase is normal CMP is normal CT head negative.  Chest x-ray negative  Patient had no improvement of pain with oxycodone  therefore given a dose of IV fentanyl    7:54 PM patient now having significant more pain in her legs rocking back and forth.  She reports pain in the upper legs bilaterally.  No known falls.  10:29 PM according to  family they report some increasing weakness with balance issues and some double vision.  Unclear the cause of patient's leg pain.  Seems to be more comfortable after Dilaudid .  Given her acute change of difficulties with ambulating in the setting of some leg pain as well we will discuss hospital team for admission for further workup.  I did use Doppler to confirm patient is good good pulses bilaterally  The patient is on the cardiac monitor to evaluate for evidence of arrhythmia and/or significant heart rate changes.      FINAL CLINICAL IMPRESSION(S) / ED DIAGNOSES   Final diagnoses:  Weakness  Pain in both lower extremities     Rx / DC Orders   ED Discharge Orders     None        Note:  This document was prepared using Dragon voice recognition software and may include unintentional dictation errors.   Ernest Ronal BRAVO, MD 11/24/24 2230    Ernest Ronal BRAVO, MD 11/24/24 2246  "

## 2024-11-24 NOTE — ED Notes (Signed)
 This tech answered pt call light. Pt appeared visibly agitated and family stated, She needs some major pain meds. She is in a lot of pain. Family reassured that the pts nurse will be notified.

## 2024-11-24 NOTE — ED Triage Notes (Addendum)
 Pt to ED via POV from home. Pt reports bilateral leg pain and lower abd pain x2-3 days. Pt also reports vision changes for over a month   128/72 CBG 105 98% RA  HR 80

## 2024-11-25 ENCOUNTER — Observation Stay

## 2024-11-25 DIAGNOSIS — R197 Diarrhea, unspecified: Secondary | ICD-10-CM | POA: Diagnosis not present

## 2024-11-25 DIAGNOSIS — M79651 Pain in right thigh: Secondary | ICD-10-CM | POA: Diagnosis not present

## 2024-11-25 DIAGNOSIS — H532 Diplopia: Secondary | ICD-10-CM | POA: Diagnosis not present

## 2024-11-25 DIAGNOSIS — M79652 Pain in left thigh: Secondary | ICD-10-CM | POA: Diagnosis not present

## 2024-11-25 LAB — GASTROINTESTINAL PANEL BY PCR, STOOL (REPLACES STOOL CULTURE)

## 2024-11-25 LAB — LIPID PANEL
Cholesterol: 191 mg/dL (ref 0–200)
HDL: 82 mg/dL
LDL Cholesterol: 100 mg/dL — ABNORMAL HIGH (ref 0–99)
Total CHOL/HDL Ratio: 2.3 ratio
Triglycerides: 45 mg/dL
VLDL: 9 mg/dL (ref 0–40)

## 2024-11-25 LAB — C-REACTIVE PROTEIN: CRP: <0.5 mg/dL

## 2024-11-25 LAB — C DIFFICILE QUICK SCREEN W PCR REFLEX
C Diff antigen: NEGATIVE
C Diff interpretation: NOT DETECTED
C Diff toxin: NEGATIVE

## 2024-11-25 LAB — HEMOGLOBIN A1C
Hgb A1c MFr Bld: 6 % — ABNORMAL HIGH (ref 4.8–5.6)
Mean Plasma Glucose: 125.5 mg/dL

## 2024-11-25 LAB — URIC ACID: Uric Acid, Serum: 4.8 mg/dL (ref 2.5–7.1)

## 2024-11-25 LAB — SEDIMENTATION RATE: Sed Rate: 26 mm/h (ref 0–30)

## 2024-11-25 MED ORDER — GABAPENTIN 100 MG PO CAPS
100.0000 mg | ORAL_CAPSULE | Freq: Two times a day (BID) | ORAL | Status: DC
  Administered 2024-11-25 – 2024-11-28 (×7): 100 mg via ORAL
  Filled 2024-11-25 (×2): qty 1

## 2024-11-25 MED ORDER — PANCRELIPASE (LIP-PROT-AMYL) 36000-114000 UNITS PO CPEP: ORAL_CAPSULE | ORAL | 0 refills | Status: AC

## 2024-11-25 MED ORDER — LIDOCAINE 5 % EX OINT: 1.0000 | TOPICAL_OINTMENT | CUTANEOUS | 0 refills | Status: AC | PRN

## 2024-11-25 MED ORDER — GABAPENTIN 100 MG PO CAPS: 100.0000 mg | ORAL_CAPSULE | Freq: Two times a day (BID) | ORAL | 0 refills | Status: AC

## 2024-11-25 MED ORDER — DICLOFENAC SODIUM 1 % EX GEL: 2.0000 g | Freq: Four times a day (QID) | CUTANEOUS | 0 refills | Status: AC | PRN

## 2024-11-25 MED ORDER — DIAZEPAM 2 MG PO TABS
2.0000 mg | ORAL_TABLET | Freq: Once | ORAL | Status: AC
  Administered 2024-11-25: 2 mg via ORAL
  Filled 2024-11-25: qty 1

## 2024-11-25 MED ORDER — PANCRELIPASE (LIP-PROT-AMYL) 12000-38000 UNITS PO CPEP
24000.0000 [IU] | ORAL_CAPSULE | Freq: Three times a day (TID) | ORAL | Status: DC
  Administered 2024-11-25 – 2024-11-28 (×9): 24000 [IU] via ORAL
  Filled 2024-11-25: qty 2

## 2024-11-25 NOTE — Discharge Summary (Addendum)
 " Physician Discharge Summary   Patient: [name] MRN: [id number] DOB: 1951-06-06  Admit date:     11/24/2024  Discharge date: 11/25/2024  Discharge Physician: [name]   PCP: [name] Saddie FALCON, PA-C   Recommendations at discharge:   Follow-up with PCP in 1 week. Referred to ophthalmology to be seen in 1 to 2 weeks, son-in-law is aware  Discharge Diagnoses: Principal Problem:   Double vision Active Problems:   Diarrhea   Abdominal pain   Upper leg pain   HLD (hyperlipidemia)   Kidney lesion, native, left   Parkinsonism (HCC)  Resolved Problems:   * No resolved hospital problems. *  Hospital Course: [name] is a 73 y.o. female with medical history significant of ulcerative colitis (s/p of total colectomy and s/p of ileostomy), HLD, prediabetes, parkinsonism, strongyloidiasis, GERD, who presents with lower abdominal pain, loose stool, bilateral upper leg pain, double vision.  Patient also has ileostomy, has increased output recently. She was given IV fluids, no longer dehydrated.  She will be discharged with the follow-ups.  Assessment and Plan: Double vision: Patient has intermittent double vision for 1 month.  Has poor balance.   CT head did not show any acute changes, MRI brain also did not show any acute changes.   Diarrhea and abdominal pain Increased ileostomy output.: CT of abdomen/pelvis is negative for acute intraabdominal issues.  Liver function and lipase normal. Negative C. difficile and a negative GI panel. Increased ileostomy had to be intermittent, will add Creon .  Patient was previously tested for small intestine bacterial overgrowth, was negative.   Upper leg pain: Patient has bilateral upper leg pain.  Etiology is not today.  No fall or injury.  No erythema on examination.  CK level normal 75.  Lower extremity venous Doppler negative for DVT.  Pedal pulses 2+, no leg coldness. Patient describes the pain at bilateral anterior thigh, this  could be from radiculopathy from lumbar spine.  Patient had a scheduled nerve conduction study with the PCP, will let the patient go back to him.  At the meantime, I will start low-dose Neurontin .  HLD (hyperlipidemia): Patient states that she is not taking medications currently.    Kidney lesion, native, left: This is incidental findings by CT scan. -Follow-up with PCP for outpatient workup, recommended MRI scan in 6 months.   Parkinsonism [name]) -Fall precaution      PT recommended SNF, cancel discharge.          Consultants: None Procedures performed: None  Disposition: Home health Diet recommendation:  Discharge Diet Orders (From admission, onward)     Start     Ordered   11/25/24 0000  Diet - low sodium heart healthy        11/25/24 0932           Cardiac diet DISCHARGE MEDICATION: Allergies as of 11/25/2024   No Known Allergies      Medication List     STOP taking these medications    betamethasone  valerate lotion 0.1 % Commonly known as: VALISONE    cholestyramine  4 g packet Commonly known as: Questran    methocarbamol  500 MG tablet Commonly known as: ROBAXIN    pantoprazole  40 MG tablet Commonly known as: PROTONIX        TAKE these medications    diclofenac  Sodium 1 % Gel Commonly known as: VOLTAREN  Apply 2 g topically 4 (four) times daily as needed.   gabapentin  100 MG capsule Commonly known as: Neurontin  Take 1 capsule (100  mg total) by mouth 2 (two) times daily.   lidocaine  5 % ointment Commonly known as: XYLOCAINE  Apply 1 Application topically as needed. Apply to neck   lipase/protease/amylase 63999 UNITS Cpep capsule Commonly known as: Creon  Take 2 capsules (72,000 Units total) by mouth 3 (three) times daily with meals. May also take 1 capsule (36,000 Units total) as needed (with snacks - up to 4 snacks daily).   ondansetron  4 MG disintegrating tablet Commonly known as: ZOFRAN -ODT Take 1 tablet (4 mg total) by mouth every 8  (eight) hours as needed for nausea or vomiting.   promethazine  25 MG tablet Commonly known as: PHENERGAN  Take 1 tablet (25 mg total) by mouth every 8 (eight) hours as needed for nausea or vomiting (At bedtime as needed).        Follow-up Information     [name] Saddie FALCON, PA-C Follow up in 1 week(s).   Specialty: Physician Assistant Contact information: [address] [place] [address] [phone]                Discharge Exam: [name]   11/25/24 0037  Weight: 44.4 kg   General exam: Appears calm and comfortable  Respiratory [name]: Clear to auscultation. Respiratory effort normal. Cardiovascular [name]: S1 & S2 heard, RRR. No JVD, murmurs, rubs, gallops or clicks. No pedal edema. Gastrointestinal [name]: Abdomen is nondistended, soft and nontender. No organomegaly or masses felt. Normal bowel sounds heard. Central nervous [name]: Alert and oriented x3.  Hand tremor. Extremities: Symmetric 5 x 5 power. Skin: No rashes, lesions or ulcers Psychiatry: Judgement and insight appear normal. Mood & affect appropriate.    Condition at discharge: fair  The results of significant diagnostics from this hospitalization (including imaging, microbiology, ancillary and laboratory) are listed below for reference.   Imaging Studies: MR BRAIN WO CONTRAST Result Date: 11/25/2024 EXAM: MRI Brain Without Contrast 11/25/2024 01:39:09 AM TECHNIQUE: Multiplanar multisequence MRI of the head/brain was performed without the administration of intravenous contrast. COMPARISON: MRI head 07/04/2024 CLINICAL HISTORY: Diplopia FINDINGS: BRAIN AND VENTRICLES: No acute infarct. No intracranial hemorrhage. No mass. No midline shift. No hydrocephalus. The sella is unremarkable. Normal flow voids. ORBITS: No acute abnormality. SINUSES AND MASTOIDS: No acute abnormality. BONES AND SOFT TISSUES: Normal marrow signal. No acute soft tissue abnormality. IMPRESSION: 1. No acute intracranial  abnormality. Electronically signed by: Gilmore Molt 11/25/2024 02:03 AM EST RP Workstation: [id number]   CT ABDOMEN PELVIS W CONTRAST Result Date: 11/24/2024 EXAM: CT ABDOMEN AND PELVIS WITH CONTRAST 11/24/2024 05:50:40 PM TECHNIQUE: CT of the abdomen and pelvis was performed with the administration of 75 mL of iohexol  (OMNIPAQUE ) 300 MG/ML solution. Multiplanar reformatted images are provided for review. Automated exposure control, iterative reconstruction, and/or weight-based adjustment of the mA/kV was utilized to reduce the radiation dose to as low as reasonably achievable. COMPARISON: CT abdomen and pelvis 06/23/2024. CLINICAL HISTORY: Abdominal pain, acute, nonlocalized. FINDINGS: LOWER CHEST: No acute abnormality. LIVER: The liver is unremarkable. GALLBLADDER AND BILE DUCTS: A gallstone is present. No biliary ductal dilatation. SPLEEN: No acute abnormality. PANCREAS: No acute abnormality. ADRENAL GLANDS: No acute abnormality. KIDNEYS, URETERS AND BLADDER: Bilateral renal cysts are present measuring up to 2.1 cm. There is a mildly hyperdense rounded structure in the inferior pole of the left kidney measuring 1 cm which is unchanged. There is no hydronephrosis. No stones in the kidneys or ureters. No perinephric or periureteral stranding. Urinary bladder is unremarkable. Per consensus, no follow-up is needed for simple Bosniak type 1  and 2 renal cysts, unless the patient has a malignancy history or risk factors. GI AND BOWEL: Stomach demonstrates no acute abnormality. Lower quadrant ostomy is present with large peristomal hernia containing nondilated bowel. This is unchanged from prior. There is no bowel obstruction. PERITONEUM AND RETROPERITONEUM: No ascites. No free air. VASCULATURE: Aorta is normal in caliber. There are atherosclerotic calcifications of the aorta. LYMPH NODES: No lymphadenopathy. REPRODUCTIVE ORGANS: No acute abnormality. BONES AND SOFT TISSUES: There are severe degenerative changes  of both hips, left greater than right. No acute osseous abnormality. No focal soft tissue abnormality. IMPRESSION: 1. No acute findings in the abdomen or pelvis. 2. Large peristomal hernia containing nondilated bowel, unchanged from prior. 3. Stable 1 cm mildly hyperdense left renal lesion, incompletely characterized, recommend renal protocol MRI (preferred) or CT without and with contrast in 6-12 months. 4. Cholelithiasis. Electronically signed by: Greig Pique MD 11/24/2024 07:07 PM EST RP Workstation: [id number]   US  Venous Img Lower Bilateral Result Date: 11/24/2024 EXAM: ULTRASOUND DUPLEX OF THE BILATERAL LOWER EXTREMITY VEINS TECHNIQUE: Duplex ultrasound using B-mode/gray scaled imaging and Doppler spectral analysis and color flow was obtained of the deep venous structures of the bilateral lower extremity. COMPARISON: None available. CLINICAL HISTORY: leg swelling FINDINGS: BILATERAL: LEFT: The common femoral vein, femoral vein, popliteal vein, and posterior tibial vein demonstrate normal compressibility with normal color flow and spectral analysis. There is a Baker cyst in the left popliteal fossa measuring 3.7 x 1.1 x 1.2 cm. RIGHT: The common femoral vein, femoral vein, popliteal vein, and posterior tibial vein demonstrate normal compressibility with normal color flow and spectral analysis. IMPRESSION: 1. No evidence of deep venous thrombosis in the left lower extremity. 2. Baker cyst in the left popliteal fossa measuring 3.7 x 1.1 x 1.2 cm. Electronically signed by: Greig Pique MD 11/24/2024 06:53 PM EST RP Workstation: [id number]   CT Head Wo Contrast Result Date: 11/24/2024 EXAM: CT HEAD WITHOUT CONTRAST 11/24/2024 02:12:00 PM TECHNIQUE: CT of the head was performed without the administration of intravenous contrast. Automated exposure control, iterative reconstruction, and/or weight based adjustment of the mA/kV was utilized to reduce the radiation dose to as low as reasonably achievable.  COMPARISON: 06/23/2024 CLINICAL HISTORY: vision changes FINDINGS: BRAIN AND VENTRICLES: No acute hemorrhage. No evidence of acute infarct. No hydrocephalus. No extra-axial collection. No mass effect or midline shift. Atherosclerosis of skullbase vasculature without hyperdense vessel or abnormal calcification. ORBITS: No acute abnormality. SINUSES: No acute abnormality. SOFT TISSUES AND SKULL: No acute soft tissue abnormality. No skull fracture. IMPRESSION: 1. No acute intracranial abnormality. Electronically signed by: Donnice Mania MD 11/24/2024 03:33 PM EST RP Workstation: [id number]   DG Chest 2 View Result Date: 11/24/2024 CLINICAL DATA:  cough EXAM: CHEST - 2 VIEW COMPARISON:  None available. FINDINGS: Cardiomediastinal silhouette and pulmonary vasculature are within normal limits. Lungs are clear. IMPRESSION: No acute cardiopulmonary process. Electronically Signed   By: Aliene Lloyd M.D.   On: 11/24/2024 15:27    Microbiology: Results for orders placed or performed during the hospital encounter of 11/24/24  C Difficile Quick Screen w PCR reflex     Status: None   Collection Time: 11/24/24 11:47 PM   Specimen: STOOL  Result Value Ref Range Status   C Diff antigen NEGATIVE NEGATIVE Final   C Diff toxin NEGATIVE NEGATIVE Final   C Diff interpretation No C. difficile detected.  Final    Comment: Performed at [facility], [address], [place], [place] [address]  Gastrointestinal Panel by  PCR , Stool     Status: None   Collection Time: 11/24/24 11:47 PM   Specimen: STOOL  Result Value Ref Range Status   Campylobacter species NOT DETECTED NOT DETECTED Final   Plesimonas shigelloides NOT DETECTED NOT DETECTED Final   Salmonella species NOT DETECTED NOT DETECTED Final   Yersinia enterocolitica NOT DETECTED NOT DETECTED Final   Vibrio species NOT DETECTED NOT DETECTED Final   Vibrio cholerae NOT DETECTED NOT DETECTED Final   Enteroaggregative E coli (EAEC) NOT DETECTED NOT  DETECTED Final   Enteropathogenic E coli (EPEC) NOT DETECTED NOT DETECTED Final   Enterotoxigenic E coli (ETEC) NOT DETECTED NOT DETECTED Final   Shiga like toxin producing E coli (STEC) NOT DETECTED NOT DETECTED Final   Shigella/Enteroinvasive E coli (EIEC) NOT DETECTED NOT DETECTED Final   Cryptosporidium NOT DETECTED NOT DETECTED Final   Cyclospora cayetanensis NOT DETECTED NOT DETECTED Final   Entamoeba histolytica NOT DETECTED NOT DETECTED Final   Giardia lamblia NOT DETECTED NOT DETECTED Final   Adenovirus F40/41 NOT DETECTED NOT DETECTED Final   Astrovirus NOT DETECTED NOT DETECTED Final   Norovirus GI/GII NOT DETECTED NOT DETECTED Final   Rotavirus A NOT DETECTED NOT DETECTED Final   Sapovirus (I, II, IV, and V) NOT DETECTED NOT DETECTED Final    Comment: Performed at [facility], [address] [place], [place] [address]    Labs: CBC: Recent Labs  Lab 11/24/24 1343  WBC 8.1  HGB 11.8*  HCT 36.0  MCV 95.5  PLT 391   Basic Metabolic Panel: Recent Labs  Lab 11/24/24 1343  NA 140  K 4.1  CL 104  CO2 23  GLUCOSE 92  BUN 18  CREATININE 0.62  CALCIUM 9.4   Liver Function Tests: Recent Labs  Lab 11/24/24 1343  AST 20  ALT 15  ALKPHOS 51  BILITOT 0.5  PROT 7.0  ALBUMIN 4.1   CBG: No results for input(s): GLUCAP in the last 168 hours.  Discharge time spent: 35 minutes.  Signed: Murvin Mana, MD [facility] 11/25/2024 "

## 2024-11-25 NOTE — Evaluation (Signed)
 Occupational Therapy Evaluation Patient Details Name: [name] MRN: [id number] DOB: 12-12-1950 Today's Date: 11/25/2024   History of Present Illness   Pt is a 73 y.o. female with medical history significant of ulcerative colitis (s/p of total colectomy and s/p of ileostomy), HLD, prediabetes, parkinsonism, strongyloidiasis, GERD, who presents with lower abdominal pain, loose stool, bilateral upper leg pain, double vision. CT of head negative for acute intracranial abnormalities. LE Doppler negative for DVT.     Clinical Impressions Pt admitted with above. Prior to admission, pt used 4WW for mobility, requires assist for LB ADLs and has noticed increased difficulties with UB/LB dressing + bathing. Pt lives with spouse, has other family nearby as needed. On OT eval, pt denies changes to vision including blurred vision or diplopia. Requires MAX A for bed mobility, MAX A to don socks bed level, MAX A for STS using RW, presents with heavy posterior bias and is unable to shift weight to perform standing marches in standing. Anticipate MIN A for UB ADLs, MAX A for LB ADLs and potential for +2 assist for safe transfers t/f [facility]. Pt tremulous throughout, difficult to obtain clear picture of BLE pain vs anxiety. Pt plans to discharge home with family support, OT educates on need for significant +1 assist as pt unable to ambulate on eval. Strongly recommend STR vs HH.    Pt would benefit from skilled OT services to address noted impairments and functional limitations (see below for any additional details) in order to maximize safety and independence while minimizing falls risk and caregiver burden. Anticipate the need for follow up OT services upon acute hospital DC. Patient will benefit from continued inpatient follow up therapy, <3 hours/day      If plan is discharge home, recommend the following:   Two people to help with walking and/or transfers;A lot of help with  bathing/dressing/bathroom;Direct supervision/assist for medications management;Direct supervision/assist for financial management;Assist for transportation;Help with stairs or ramp for entrance;Supervision due to cognitive status;Assistance with cooking/housework     Functional Status Assessment   Patient has had a recent decline in their functional status and demonstrates the ability to make significant improvements in function in a reasonable and predictable amount of time.     Equipment Recommendations   BSC/3in1      Precautions/Restrictions   Precautions Precautions: Fall Restrictions Weight Bearing Restrictions Per Provider Order: No     Mobility Bed Mobility Overal bed mobility: Needs Assistance Bed Mobility: Sit to Supine, Supine to Sit     Supine to sit: Max assist, HOB elevated Sit to supine: Max assist   General bed mobility comments: pt requires cues to self-initiate movement, is quick to say I can't and ultimately requires MAX A  and use of bed pads to transition to seated. MAX A with assist for BLE/trunk to return to supine.    Transfers Overall transfer level: Needs assistance Equipment used: Rolling walker (2 wheels) Transfers: Sit to/from Stand Sit to Stand: Max assist           General transfer comment: max multimodal cuing for hand placement, pt requires cues for narrow BOS correction initally, R foot blocked, pt able to transition to standing with heavy posterior bias, pt difficulties following commands and shifting weight anteriorly. second standing attempt with consistent posterior push; able to shift weight minimally to R foot to pick up L, cannot transfer weight to L foot and is unable to pick up R. MAX A for standing support at trunk. Transfers to recliner  deferred as anticipate need for +2 for safety and pain meds.      Balance Overall balance assessment: Needs assistance Sitting-balance support: Feet supported, Bilateral upper extremity  supported Sitting balance-Leahy Scale: Fair Sitting balance - Comments: able to maintain static balance with close supervision   Standing balance support: During functional activity, Bilateral upper extremity supported, Reliant on assistive device for balance Standing balance-Leahy Scale: Zero Standing balance comment: requires MAX A posteriorly at trunk with foot block on R foot for static standing.                           ADL either performed or assessed with clinical judgement   ADL Overall ADL's : Needs assistance/impaired                                     Functional mobility during ADLs: Maximal assistance;Rolling walker (2 wheels) General ADL Comments: MIN A for UB ADLs, MAX A for LB ADLs, unable to transfer to [facility] this date with anxiety/pain. Anticipate +2 for safe transfers t/f [facility]. MAX A with posterior lean in standing, pt currently unable to march in place, requires significant assist for static standing and is unable to weight shift anteriorly     Vision Baseline Vision/History: 1 Wears glasses Ability to See in Adequate Light: 0 Adequate Patient Visual Report: No change from baseline Additional Comments: wears glasses intermittently for distance. pt currently with no c/o diplopia or blurred vision            Pertinent Vitals/Pain Pain Assessment Pain Assessment: 0-10 Pain Score: 5  Pain Location: BLE, pt with inconsistent reports of pain/question impact of anxiety Pain Descriptors / Indicators: Aching, Dull, Guarding Pain Intervention(s): Limited activity within patient's tolerance, Monitored during session, Premedicated before session, Repositioned, Other (comment)     Extremity/Trunk Assessment Upper Extremity Assessment Upper Extremity Assessment: Generalized weakness (bilateral tremors impact GMC/FMC)   Lower Extremity Assessment Lower Extremity Assessment: Generalized weakness (tremors in RLE > LE)   Cervical / Trunk  Assessment Cervical / Trunk Assessment: Kyphotic   Communication Communication Communication: Impaired Factors Affecting Communication: Difficulty expressing self;Hearing impaired   Cognition Arousal: Alert Behavior During Therapy: Anxious Cognition: Cognition impaired, Difficult to assess           Executive functioning impairment (select all impairments): Reasoning, Problem solving OT - Cognition Comments: difficult to assess due to anxiety, pt with inconsistent reports during answering questions, often defers to family in room. follows some commands with increased time.                 Following commands: Impaired Following commands impaired: Follows one step commands with increased time, Follows one step commands inconsistently     Cueing  General Comments   Cueing Techniques: Verbal cues;Gestural cues;Tactile cues  RN in room for pain meds, family in room for session.           Home Living Family/patient expects to be discharged to:: Private residence Living Arrangements: Spouse/significant other Available Help at Discharge: Available PRN/intermittently;Available 24 hours/day;Family Type of Home: House Home Access: Stairs to enter [employer] of Steps: 3 Entrance Stairs-Rails: Right Home Layout: Multi-level;Full bath on main level;Able to live on main level with bedroom/bathroom (3 level but can live on main floor with bedroom + bathroom, kitchen)     Bathroom Shower/Tub: Producer, Television/film/video: Standard (  low commode)     Home Equipment: Rollator (4 wheels);Grab bars - toilet;Grab bars - tub/shower;Shower seat - built in;Hand held shower head          Prior Functioning/Environment Prior Level of Function : Needs assist       Physical Assist : Mobility (physical);ADLs (physical) Mobility (physical): Bed mobility;Transfers;Gait;Stairs ADLs (physical): IADLs;Dressing Mobility Comments: 4WW, no falls ADLs Comments: requires  assist for LB ADLs.  increased difficulties with all ADL performance including dressing, bathing, etc.    OT Problem List: Decreased strength;Decreased range of motion;Decreased activity tolerance;Impaired balance (sitting and/or standing);Decreased cognition;Decreased knowledge of use of DME or AE;Decreased knowledge of precautions;Pain   OT Treatment/Interventions: Self-care/ADL training;Therapeutic exercise;DME and/or AE instruction;Therapeutic activities;Patient/family education;Balance training      OT Goals(Current goals can be found in the care plan section)   Acute Rehab OT Goals OT Goal Formulation: With patient/family Time For Goal Achievement: 12/09/24 Potential to Achieve Goals: Fair   OT Frequency:  Min 2X/week       AM-PAC OT 6 Clicks Daily Activity     Outcome Measure Help from another person eating meals?: A Little Help from another person taking care of personal grooming?: A Little Help from another person toileting, which includes using toliet, bedpan, or urinal?: Total Help from another person bathing (including washing, rinsing, drying)?: Total Help from another person to put on and taking off regular upper body clothing?: A Lot Help from another person to put on and taking off regular lower body clothing?: Total 6 Click Score: 11   End of Session Equipment Utilized During Treatment: Gait belt;Rolling walker (2 wheels) Nurse Communication: Mobility status;Patient requests pain meds  Activity Tolerance: Patient limited by pain;Patient limited by fatigue Patient left: in bed;with call bell/phone within reach;with bed alarm set;with nursing/sitter in room;with family/visitor present  OT Visit Diagnosis: Muscle weakness (generalized) (M62.81);Other abnormalities of gait and mobility (R26.89);Unsteadiness on feet (R26.81);Pain Pain - Right/Left:  (BLE) Pain - part of body: Hip;Knee;Leg                Time: 9058-8974 OT Time Calculation (min): 44 min Charges:   OT General Charges $OT Visit: 1 Visit OT Evaluation $OT Eval Low Complexity: 1 Low OT Treatments $Self Care/Home Management : 8-22 mins Arnetta Odeh L. Terrika Zuver, OTR/L  11/25/2024, 11:51 AM

## 2024-11-25 NOTE — [place] FL2 (Signed)
 " [place]  MEDICAID FL2 LEVEL OF CARE FORM     IDENTIFICATION  Patient Name: [name] Birthdate: 12-13-1950 Sex: female Admission Date (Current Location): 11/24/2024  [facility] [place] Number:      Facility and Address:         Provider Number:    Attending Physician Name and Address:  Laurita Pillion, MD  Relative Name and Phone Number:       Current Level of Care:   Recommended Level of Care:   Prior Approval Number:    Date Approved/Denied:   PASRR Number:    Discharge Plan:      Current Diagnoses: Patient Active Problem List   Diagnosis Date Noted   Double vision 11/24/2024   HLD (hyperlipidemia) 11/24/2024   Abdominal pain 11/24/2024   Kidney lesion, native, left 11/24/2024   Upper leg pain 11/24/2024   FTT (failure to thrive) in adult 09/27/2024   History of strongyloidiasis 09/27/2024   Blood-tinged sputum 09/27/2024   Irritant contact dermatitis associated with fecal stoma 08/20/2024   Nausea 06/27/2024   Acute generalized abdominal pain 06/27/2024   Status post total colectomy 04/18/2023   Parkinsonism (HCC) 03/28/2023   Pre-diabetes 10/29/2021   Dyslipidemia, goal LDL below 100 10/29/2021   Ileostomy status (HCC) 05/02/2017   Facial rash 07/19/2011   SLEEP APNEA, OBSTRUCTIVE 07/03/2010   BASILAR LUNG ATELECTASIS 10/07/2009   Fatigue 03/19/2009   Alopecia 10/09/2008   Diarrhea 08/21/2008   UNIVERSAL ULCERATIVE COLITIS 04/05/2008    Orientation RESPIRATION BLADDER Height & Weight            Weight: 44.4 kg Height:  4' 11 (149.9 cm)  BEHAVIORAL SYMPTOMS/MOOD NEUROLOGICAL BOWEL NUTRITION STATUS           AMBULATORY STATUS COMMUNICATION OF NEEDS Skin                               Personal Care Assistance Level of Assistance              Functional Limitations Info             SPECIAL CARE FACTORS FREQUENCY                       Contractures      Additional Factors Info                   Current Medications (11/25/2024):  This is the current hospital active medication list Current Facility-Administered Medications  Medication Dose Route Frequency Provider Last Rate Last Admin    stroke: early stages of recovery book   Does not apply Once Niu, Xilin, MD       acetaminophen  (TYLENOL ) tablet 650 mg  650 mg Oral Q6H PRN Niu, Xilin, MD   650 mg at 11/25/24 0327   enoxaparin  (LOVENOX ) injection 30 mg  30 mg Subcutaneous Q24H Niu, Xilin, MD       gabapentin  (NEURONTIN ) capsule 100 mg  100 mg Oral BID Laurita Pillion, MD   100 mg at 11/25/24 1411   lipase/protease/amylase (CREON ) capsule 24,000 Units  24,000 Units Oral TID AC Zhang, Dekui, MD       morphine  (PF) 2 MG/ML injection 2 mg  2 mg Intravenous Q3H PRN Niu, Xilin, MD   2 mg at 11/25/24 9349   ondansetron  (ZOFRAN ) injection 4 mg  4 mg Intravenous Q8H PRN Niu, Xilin, MD  oxyCODONE -acetaminophen  (PERCOCET/ROXICET) 5-325 MG per tablet 1 tablet  1 tablet Oral Q4H PRN Niu, Xilin, MD   1 tablet at 11/25/24 1535     Discharge Medications: Please see discharge summary for a list of discharge medications.  Relevant Imaging Results:  Relevant Lab Results:   Additional Information: SS# [id number]    [name], RN     "

## 2024-11-25 NOTE — Progress Notes (Addendum)
 Pt c/o pain, morphine  given q 3h; pain has not subsided per pt; pt stated pain is bilaterally in upper thighs; reassure pt and family with meds given. Will continue to monitor.

## 2024-11-25 NOTE — Progress Notes (Signed)
 PHARMACIST - PHYSICIAN COMMUNICATION  CONCERNING:  Enoxaparin  (Lovenox ) for DVT Prophylaxis    RECOMMENDATION: Patient was prescribed enoxaprin 40mg  q24 hours for VTE prophylaxis.   Filed Weights   11/25/24 0037  Weight: 44.4 kg (97 lb 12.8 oz)    Body mass index is 19.75 kg/m.  Estimated Creatinine Clearance: 42.7 mL/min (by C-G formula based on SCr of 0.62 mg/dL).  Patient is candidate for enoxaparin  30mg  every 24 hours based on CrCl <36ml/min or Weight <45kg  DESCRIPTION: Pharmacy has adjusted enoxaparin  dose per [facility] policy.  Patient is now receiving enoxaparin  30 mg every 24 hours   Rankin CANDIE Dills, PharmD, [facility] 11/25/2024 12:47 AM

## 2024-11-25 NOTE — Evaluation (Signed)
 Physical Therapy Evaluation Patient Details Name: [name] MRN: [id number] DOB: [birth year] Today's Date: 11/25/2024  History of Present Illness  Pt is a 73 y.o. female with medical history significant of ulcerative colitis (s/p of total colectomy and s/p of ileostomy), HLD, prediabetes, parkinsonism, strongyloidiasis, GERD, who presents with lower abdominal pain, loose stool, bilateral upper leg pain, double vision. CT of head negative for acute intracranial abnormalities. LE Doppler negative for DVT.  Clinical Impression  Pt pleasant and willing to work with PT but did show considerable hesitancy with most tasks, needed extra time and cuing to initiate and complete tasks and generally did not show much confidence or stability with basic mobility testing/effort.  She generally had retro lean when every in standing, though with excessive cuing to keep weight forward on walker and tactile cuing to prevent retro lean she did have times where she could maintain with only CGA.  Pt displayed consistent tremor and halting/shuffling gait with inconsistent ability to initiate movement much of the time.  Pt is far from her ostensible active baseline and will need continued PT to address functional and safety limitations.        If plan is discharge home, recommend the following: A lot of help with walking and/or transfers;A lot of help with bathing/dressing/bathroom;Assistance with cooking/housework;Assist for transportation;Help with stairs or ramp for entrance   Can travel by private vehicle   No    Equipment Recommendations  (TBD at next venue of care - youth walker if going home)  Recommendations for Other Services       Functional Status Assessment       Precautions / Restrictions Precautions Precautions: Fall Restrictions Weight Bearing Restrictions Per Provider Order: No      Mobility  Bed Mobility Overal bed mobility: Needs Assistance Bed Mobility: Sit to Supine, Supine to  Sit     Supine to sit: Min assist Sit to supine: Max assist   General bed mobility comments: Pt slow to initiate movement, able to do bulk of getting LEs off EOB, needed significantly more assist to get LEs back up into bed and get to supine    Transfers Overall transfer level: Needs assistance Equipment used: Rolling walker (2 wheels) Transfers: Sit to/from Stand, Bed to chair/wheelchair/BSC Sit to Stand: Mod assist, Min assist Stand pivot transfers: Mod assist, Max assist (needed physical assist to lift/move LEs to initiate movement - very inconsistent with ability to move LEs and keep weight forward on walker simultaneously)         General transfer comment: Pt needing excessive cuing (verbal and physical) to initiate and maintain rise to standing - heavily leaning back of legs on support (bed, BSC, recliner) to each attempt and strugglingto shift weight forward over the walker despite much cuing and assist    Ambulation/Gait Ambulation/Gait assistance: Max assist, Mod assist Gait Distance (Feet): 25 Feet Assistive device: Rolling walker (2 wheels)         General Gait Details: Again pt had a hard time initiating any steppage and when she did she displayed halting/shuffling gait and needed direct assist with walker placement/assist to keep from leaning back.  After excessive cuing and slow effort she did manage to maintain shuffling but much less assisted gait for ~10 ft of straight-a-way but ultimately was quite limited and far from her ostensible baseline ambulate state  Stairs            Wheelchair Mobility     Tilt Bed    Modified  Rankin (Stroke Patients Only)       Balance Overall balance assessment: Needs assistance Sitting-balance support: Feet supported, Bilateral upper extremity supported Sitting balance-Leahy Scale: Fair Sitting balance - Comments: able to maintain static balance with close supervision   Standing balance support: During functional  activity, Bilateral upper extremity supported, Reliant on assistive device for balance Standing balance-Leahy Scale: Poor Standing balance comment: posterior lean much of the time, difficulty consistently keeping weight on UEs/the walker                             Pertinent Vitals/Pain Pain Assessment Pain Assessment: Faces Faces Pain Scale: Hurts little more Pain Location: pt reports b/l thighs are very sore Like I did a bunch of squats but did not seem to be a functional limiter this session Pain Descriptors / Indicators: Aching Pain Intervention(s): Premedicated before session (got percoset)    Home Living Family/patient expects to be discharged to:: Skilled nursing facility                        Prior Function Prior Level of Function : Needs assist             Mobility Comments: 4WW, no falls, rarely out of the home but on her feel much of the day... ADLs Comments: reports that until recently she was bathing, using rest room, dressing, etc w/o assist     Extremity/Trunk Assessment   Upper Extremity Assessment Upper Extremity Assessment: Generalized weakness (consistent tremoring, R>L)    Lower Extremity Assessment Lower Extremity Assessment: Generalized weakness (slow to initiate movements, guarded due to quad/thigh pain - grossly 3+/5 with general hesitancy)       Communication   Communication Communication: Impaired Factors Affecting Communication: Difficulty expressing self;Hearing impaired    Cognition Arousal: Alert Behavior During Therapy: Anxious   PT - Cognitive impairments: No apparent impairments                         Following commands: Impaired Following commands impaired: Follows one step commands with increased time, Follows one step commands inconsistently (Parkinsonian-like hesitancy/halting movement and initiation)     Cueing Cueing Techniques: Verbal cues, Gestural cues, Tactile cues     General  Comments General comments (skin integrity, edema, etc.): HR 110s transitioning to supine to sit, 100s after ambulation effort    Exercises     Assessment/Plan    PT Assessment    PT Problem List         PT Treatment Interventions      PT Goals (Current goals can be found in the Care Plan section)  Acute Rehab PT Goals Patient Stated Goal: get moving better PT Goal Formulation: With patient Time For Goal Achievement: 12/07/24 Potential to Achieve Goals: Fair    Frequency Min 2X/week     Co-evaluation               AM-PAC PT 6 Clicks Mobility  Outcome Measure Help needed turning from your back to your side while in a flat bed without using bedrails?: A Little Help needed moving from lying on your back to sitting on the side of a flat bed without using bedrails?: A Lot Help needed moving to and from a bed to a chair (including a wheelchair)?: A Lot Help needed standing up from a chair using your arms (e.g., wheelchair or bedside chair)?: A Lot Help  needed to walk in hospital room?: A Lot Help needed climbing 3-5 steps with a railing? : Total 6 Click Score: 12    End of Session Equipment Utilized During Treatment: Gait belt Activity Tolerance: Patient limited by fatigue Patient left: with bed alarm set;with call bell/phone within reach;with family/visitor present;with nursing/sitter in room Nurse Communication: Mobility status PT Visit Diagnosis: Unsteadiness on feet (R26.81);Muscle weakness (generalized) (M62.81);Difficulty in walking, not elsewhere classified (R26.2);Pain Pain - Right/Left: Right (and Left) Pain - part of body: Knee    Time: 8854-8753 PT Time Calculation (min) (ACUTE ONLY): 61 min   Charges:   PT Evaluation $PT Eval Moderate Complexity: 1 Mod PT Treatments $Gait Training: 8-22 mins $Therapeutic Activity: 8-22 mins PT General Charges $$ ACUTE PT VISIT: 1 Visit         Carmin JONELLE Deed, DPT 11/25/2024, 2:55 PM

## 2024-11-25 NOTE — Progress Notes (Signed)
 SLP Cancellation Note  Patient Details Name: LATANZA PFEFFERKORN MRN: [id number] DOB: 31-Jul-1951   Cancelled treatment:       Reason Eval/Treat Not Completed: SLP screened, no needs identified, will sign off (chart reviewed; consulted NSG then met w/ Family and pt in room.)   Pt is a 73 y.o. female with medical history significant of ulcerative colitis (s/p of total colectomy and s/p of ileostomy), HLD, prediabetes, parkinsonism, strongyloidiasis, GERD, who presents with lower abdominal pain, loose stool, bilateral upper leg pain, double vision.  MRI -- Negative.   Pt denied any difficulty swallowing and is currently on a regular diet; tolerates swallowing pills 1 at a time w/ water per NSG. Discussed the option of swallowing pills w/ applesauce/yogurt d/t the cohesion of the material.  Pt conversed in conversation w/out expressive/receptive deficits noted; pt denied any speech-language deficits. Speech clear. Noted mild tremor of UEs(baseline for pt). No further skilled ST services indicated as pt appears at her baseline. Family member agreed. Pt agreed. NSG to reconsult if any change in status while admitted.      Comer Portugal, MS, CCC-SLP Speech Language Pathologist Rehab Services; [facility] [phone] (ascom) Helix Lafontaine 11/25/2024, 1:31 PM

## 2024-11-25 NOTE — Plan of Care (Signed)
" °  Problem: Ischemic Stroke/TIA Tissue Perfusion: Goal: Complications of ischemic stroke/TIA will be minimized Outcome: Progressing   Problem: Coping: Goal: Will verbalize positive feelings about self Outcome: Progressing   Problem: Health Behavior/Discharge Planning: Goal: Ability to manage health-related needs will improve Outcome: Progressing   Problem: Clinical Measurements: Goal: Ability to maintain clinical measurements within normal limits will improve Outcome: Progressing   Problem: Pain Managment: Goal: General experience of comfort will improve and/or be controlled Outcome: Progressing   Problem: Elimination: Goal: Will not experience complications related to bowel motility Outcome: Progressing   "

## 2024-11-25 NOTE — TOC Initial Note (Signed)
 Transition of Care [facility]) - Initial/Assessment Note    Patient Details  Name: [name] MRN: [id number] Date of Birth: 04-28-1951  Transition of Care [facility]) CM/SW Contact:    [name] Jackquline RAMAN, RN Phone Number: 11/25/2024, 5:26 PM  Clinical Narrative:    RNCM met with patient, her son [name] at bedside and her son [name] on speaker phone. RNCM Introduced myself and my role and explained that discharge planning would be discussed. PT is recommending STR. Patient and son's are in agreement with STR but aren't familiar with the ones in the area and asked that I submit referrals for them. RNCM will review the bed offers with patient and sons once received. Patient has DME at home: Rolator, Grab bars - toilet, Grab bars - tub/shower, Shower seat - built in. One of her sons will pick her up at the time of discharge. Referrals submitted via EPIC, FL2 completed and PASRR# [id number]. RNCM will continue to follow for discharge planning needs.   Expected Discharge Plan: Skilled Nursing Facility Barriers to Discharge: Barriers Resolved   Patient Goals and CMS Choice            Expected Discharge Plan and Services       Living arrangements for the past 2 months: Single Family Home Expected Discharge Date: 11/25/24                                    Prior Living Arrangements/Services Living arrangements for the past 2 months: Single Family Home Lives with:: Self, Significant Other Patient language and need for interpreter reviewed:: Yes Do you feel safe going back to the place where you live?: Yes      Need for Family Participation in Patient Care: Yes (Comment) Care giver support system in place?: Yes (comment) Current home services: DME Criminal Activity/Legal Involvement Pertinent to Current Situation/Hospitalization: No - Comment as needed  Activities of Daily Living   ADL Screening (condition at time of admission) Independently performs ADLs?: No Is the patient  deaf or have difficulty hearing?: No Does the patient have difficulty seeing, even when wearing glasses/contacts?: No Does the patient have difficulty concentrating, remembering, or making decisions?: No  Permission Sought/Granted                  Emotional Assessment Appearance:: Appears stated age Attitude/Demeanor/Rapport: Engaged, Gracious Affect (typically observed): Accepting, Calm, Quiet, Pleasant Orientation: : Oriented to Self, Oriented to Place, Oriented to  Time, Oriented to Situation Alcohol / Substance Use: Not Applicable Psych Involvement: No (comment)  Admission diagnosis:  Double vision [H53.2] Weakness [R53.1] Pain in both lower extremities [M79.604, M79.605] Patient Active Problem List   Diagnosis Date Noted   Double vision 11/24/2024   HLD (hyperlipidemia) 11/24/2024   Abdominal pain 11/24/2024   Kidney lesion, native, left 11/24/2024   Upper leg pain 11/24/2024   FTT (failure to thrive) in adult 09/27/2024   History of strongyloidiasis 09/27/2024   Blood-tinged sputum 09/27/2024   Irritant contact dermatitis associated with fecal stoma 08/20/2024   Nausea 06/27/2024   Acute generalized abdominal pain 06/27/2024   Status post total colectomy 04/18/2023   Parkinsonism (HCC) 03/28/2023   Pre-diabetes 10/29/2021   Dyslipidemia, goal LDL below 100 10/29/2021   Ileostomy status (HCC) 05/02/2017   Facial rash 07/19/2011   SLEEP APNEA, OBSTRUCTIVE 07/03/2010   BASILAR LUNG ATELECTASIS 10/07/2009   Fatigue 03/19/2009   Alopecia 10/09/2008   Diarrhea  08/21/2008   UNIVERSAL ULCERATIVE COLITIS 04/05/2008   PCP:  Gayle Saddie JULIANNA DEVONNA Pharmacy:   [facility] [place], [place] - [address] [place] [address] Phone: [phone] Fax: [phone]  [id number] - [address] [place] - [address] [facility] [address] [place] [address] Phone: [phone] Fax:  [phone]     Social Drivers of Health (SDOH) Social History: SDOH Screenings   Food Insecurity: No Food Insecurity (11/25/2024)  Housing: Low Risk (11/25/2024)  Transportation Needs: No Transportation Needs (11/25/2024)  Utilities: Not At Risk (11/25/2024)  Alcohol Screen: Low Risk (10/29/2021)  Depression (PHQ2-9): Medium Risk (09/24/2024)  Financial Resource Strain: Low Risk (10/29/2021)  Physical Activity: Sufficiently Active (10/29/2021)  Social Connections: Moderately Isolated (11/25/2024)  Stress: No Stress Concern Present (10/29/2021)  Tobacco Use: Low Risk (11/24/2024)   SDOH Interventions:     Readmission Risk Interventions     No data to display

## 2024-11-26 DIAGNOSIS — M79652 Pain in left thigh: Secondary | ICD-10-CM | POA: Diagnosis not present

## 2024-11-26 DIAGNOSIS — H532 Diplopia: Secondary | ICD-10-CM | POA: Diagnosis not present

## 2024-11-26 DIAGNOSIS — R197 Diarrhea, unspecified: Secondary | ICD-10-CM | POA: Diagnosis not present

## 2024-11-26 DIAGNOSIS — M79651 Pain in right thigh: Secondary | ICD-10-CM | POA: Diagnosis not present

## 2024-11-26 MED ORDER — OXYCODONE-ACETAMINOPHEN 5-325 MG PO TABS
1.0000 | ORAL_TABLET | ORAL | Status: DC | PRN
  Administered 2024-11-26 – 2024-11-28 (×14): 1 via ORAL
  Filled 2024-11-26 (×14): qty 1

## 2024-11-26 NOTE — TOC Progression Note (Signed)
 Transition of Care [facility]) - Progression Note    Patient Details  Name: [name] MRN: [id number] Date of Birth: 1951/04/21  [facility] CM/SW Contact  Nathanael CHRISTELLA Ring, RN Phone Number: 11/26/2024, 3:28 PM  Clinical Narrative:    [facility] has declined to offer a bed, but there are multiple bed offers available including [facility], [facility], [place], [place], and several in [place].  Called [name] and left him a message for return call.     Expected Discharge Plan: Skilled Nursing Facility Barriers to Discharge: Barriers Resolved               Expected Discharge Plan and Services       Living arrangements for the past 2 months: Single Family Home Expected Discharge Date: 11/25/24                                     Social Drivers of Health (SDOH) Interventions SDOH Screenings   Food Insecurity: No Food Insecurity (11/25/2024)  Housing: Low Risk (11/25/2024)  Transportation Needs: No Transportation Needs (11/25/2024)  Utilities: Not At Risk (11/25/2024)  Alcohol Screen: Low Risk (10/29/2021)  Depression (PHQ2-9): Medium Risk (09/24/2024)  Financial Resource Strain: Low Risk (10/29/2021)  Physical Activity: Sufficiently Active (10/29/2021)  Social Connections: Moderately Isolated (11/25/2024)  Stress: No Stress Concern Present (10/29/2021)  Tobacco Use: Low Risk (11/24/2024)    Readmission Risk Interventions     No data to display

## 2024-11-26 NOTE — Progress Notes (Signed)
 " Progress Note   Patient: [name] FMW:[id number] DOB: Nov 25, 1951 DOA: 11/24/2024     0 DOS: the patient was seen and examined on 11/26/2024   Brief hospital course: [name] is a 73 y.o. female with medical history significant of ulcerative colitis (s/p of total colectomy and s/p of ileostomy), HLD, prediabetes, parkinsonism, strongyloidiasis, GERD, who presents with lower abdominal pain, loose stool, bilateral upper leg pain, double vision.  Patient also has ileostomy, has increased output recently. She was given IV fluids, no longer dehydrated.   However, she has significant weakness, seen by PT, recommending nursing home placement.  Currently pending placement.   Principal Problem:   Double vision Active Problems:   Diarrhea   Abdominal pain   Upper leg pain   HLD (hyperlipidemia)   Kidney lesion, native, left   Parkinsonism (HCC)   Assessment and Plan: Double vision: Patient has intermittent double vision for 1 month.  Has poor balance.   CT head did not show any acute changes, MRI brain also did not show any acute changes. Patient condition appear to be better after fluids.   Diarrhea and abdominal pain Increased ileostomy output.: CT of abdomen/pelvis is negative for acute intraabdominal issues.  Liver function and lipase normal. Negative C. difficile and a negative GI panel. Increased ileostomy had to be intermittent, will add Creon .  Patient was previously tested for small intestine bacterial overgrowth, was negative. Ileostomy output is slowing down after starting Creon .   Upper leg pain: Patient has bilateral upper leg pain.  Etiology is not today.  No fall or injury.  No erythema on examination.  CK level normal 75.  Lower extremity venous Doppler negative for DVT.  Pedal pulses 2+, no leg coldness. Patient describes the pain at bilateral anterior thigh, this could be from radiculopathy from lumbar spine.  Patient had a scheduled nerve conduction  study with the PCP, will let the patient go back to him.  At the meantime, I will start low-dose Neurontin .  She is also on Percocet as needed.   HLD (hyperlipidemia): Patient states that she is not taking medications currently.     Kidney lesion, native, left: This is incidental findings by CT scan. -Follow-up with PCP for outpatient workup, recommended MRI scan in 6 months.   Parkinsonism [facility]) -Fall precaution          Subjective:  Patient still feels tired, appetite is fair.  Still complaining leg pain.  Physical Exam: Vitals:   11/25/24 1523 11/25/24 1930 11/26/24 0409 11/26/24 0727  BP: 119/68 134/71 137/78 125/66  Pulse: 85 87 79 75  Resp:  16 16 20   Temp: 97.9 F (36.6 C) 97.9 F (36.6 C) 97.9 F (36.6 C) 97.7 F (36.5 C)  TempSrc: Oral   Oral  SpO2: 94% 100% 100% 97%  Weight:      Height:       General exam: Appears calm and comfortable  Respiratory system: Clear to auscultation. Respiratory effort normal. Cardiovascular system: S1 & S2 heard, RRR. No JVD, murmurs, rubs, gallops or clicks. No pedal edema. Gastrointestinal system: Abdomen is nondistended, soft and nontender. No organomegaly or masses felt. Normal bowel sounds heard. Central nervous system: Alert and oriented x2.  No focal neurological deficits. Extremities: Symmetric 5 x 5 power. Skin: No rashes, lesions or ulcers Psychiatry: Judgement and insight appear normal. Mood & affect appropriate.    Data Reviewed:  There are no new results to review at this time.  Family Communication: Husband  and son updated at the bedside.  Disposition: Status is: Observation Pending SNF placement.     Time spent: 35 minutes  Author: Murvin Mana, MD 11/26/2024 12:32 PM  For on call review [email or web address].    "

## 2024-11-26 NOTE — Care Management Obs Status (Signed)
 MEDICARE OBSERVATION STATUS NOTIFICATION   Patient Details  Name: [name] MRN: [id number] Date of Birth: 05/18/1951   Medicare Observation Status Notification Given:       Jomo Forand W, CMA 11/26/2024, 1:52 PM

## 2024-11-26 NOTE — Progress Notes (Signed)
 Physical Therapy Treatment Patient Details Name: [name] MRN: [id number] DOB: 1951-07-01 Today's Date: 11/26/2024   History of Present Illness Pt is a 73 y.o. female with medical history significant of ulcerative colitis (s/p of total colectomy and s/p of ileostomy), HLD, prediabetes, parkinsonism, strongyloidiasis, GERD, who presents with lower abdominal pain, loose stool, bilateral upper leg pain, double vision. CT of head negative for acute intracranial abnormalities. LE Doppler negative for DVT.    PT Comments  Pt received in bed, son at bedside, agreeable to PT session. MinA to transfer to EOB with increased time and effort due to Parkinsons like tremors. Pt sat EOB for a few minutes prior to requiring ModA to stand at RW and wt shift forward. RW advanced forward by therapist to facilitate forward wt shift, pt able to ambulate around bed with frequent freezing stops and increased time for motor control planning. Overall good tolerance for mobility. Remains very pleasant and motivated. Will continue to progress acutely.    If plan is discharge home, recommend the following: A lot of help with walking and/or transfers;A lot of help with bathing/dressing/bathroom;Assistance with cooking/housework;Assist for transportation;Help with stairs or ramp for entrance   Can travel by private vehicle     No  Equipment Recommendations  None recommended by PT (TBD at next level of care)    Recommendations for Other Services       Precautions / Restrictions Precautions Precautions: Fall Recall of Precautions/Restrictions: Intact Restrictions Weight Bearing Restrictions Per Provider Order: No     Mobility  Bed Mobility Overal bed mobility: Needs Assistance Bed Mobility: Supine to Sit           General bed mobility comments: Pt slow to initiate movement, assist to alternate LE's to edge of bed    Transfers Overall transfer level: Needs assistance Equipment used: Rolling  walker (2 wheels) Transfers: Sit to/from Stand Sit to Stand: Mod assist, Min assist           General transfer comment:  (Increased assist to stand and wt shift forward torwards RW, corrected with manual lower back cues and advancement of RW forward to facilitate wt shift)    Ambulation/Gait Ambulation/Gait assistance: Min assist Gait Distance (Feet):  (20) Assistive device: Rolling walker (2 wheels) Gait Pattern/deviations: Step-to pattern, Decreased step length - right, Decreased step length - left, Ataxic           Stairs             Wheelchair Mobility     Tilt Bed    Modified Rankin (Stroke Patients Only)       Balance Overall balance assessment: Needs assistance Sitting-balance support: Feet supported, Bilateral upper extremity supported Sitting balance-Leahy Scale: Fair     Standing balance support: During functional activity, Bilateral upper extremity supported, Reliant on assistive device for balance Standing balance-Leahy Scale: Poor Standing balance comment: posterior lean much of the time, difficulty consistently keeping weight on UEs/the walker                            Communication Communication Communication: Impaired Factors Affecting Communication: Difficulty expressing self;Hearing impaired  Cognition Arousal: Alert Behavior During Therapy: Anxious   PT - Cognitive impairments: No apparent impairments                         Following commands: Impaired Following commands impaired: Follows one step commands with increased time, Follows  one step commands inconsistently    Cueing Cueing Techniques: Verbal cues, Gestural cues, Tactile cues  Exercises      General Comments        Pertinent Vitals/Pain Pain Assessment Pain Assessment: Faces Faces Pain Scale: Hurts little more Pain Location: pt reports b/l thighs are very sore Like I did a bunch of squats but did not seem to be a functional limiter this  session Pain Descriptors / Indicators: Aching Pain Intervention(s): Heat applied, Monitored during session    Home Living                          Prior Function            PT Goals (current goals can now be found in the care plan section) Acute Rehab PT Goals Patient Stated Goal: get moving better Progress towards PT goals: Progressing toward goals    Frequency    Min 2X/week      PT Plan      Co-evaluation              AM-PAC PT 6 Clicks Mobility   Outcome Measure  Help needed turning from your back to your side while in a flat bed without using bedrails?: A Lot Help needed moving from lying on your back to sitting on the side of a flat bed without using bedrails?: A Lot Help needed moving to and from a bed to a chair (including a wheelchair)?: A Lot Help needed standing up from a chair using your arms (e.g., wheelchair or bedside chair)?: A Lot Help needed to walk in hospital room?: A Lot Help needed climbing 3-5 steps with a railing? : Total 6 Click Score: 11    End of Session Equipment Utilized During Treatment: Gait belt Activity Tolerance: Patient tolerated treatment well Patient left: in chair;with call bell/phone within reach;with chair alarm set;with family/visitor present Nurse Communication: Mobility status PT Visit Diagnosis: Unsteadiness on feet (R26.81);Muscle weakness (generalized) (M62.81);Difficulty in walking, not elsewhere classified (R26.2);Pain Pain - Right/Left:  (and Left) Pain - part of body:  (Thigh)     Time: 8799-8779 PT Time Calculation (min) (ACUTE ONLY): 20 min  Charges:    $Therapeutic Activity: 8-22 mins PT General Charges $$ ACUTE PT VISIT: 1 Visit                    Darice Bohr, PTA  Darice JAYSON Bohr 11/26/2024, 3:01 PM

## 2024-11-26 NOTE — Plan of Care (Signed)

## 2024-11-26 NOTE — Progress Notes (Signed)
 Occupational Therapy Treatment Patient Details Name: [name] MRN: [id number] DOB: 07-12-1951 Today's Date: 11/26/2024   History of present illness Pt is a 73 y.o. female with medical history significant of ulcerative colitis (s/p of total colectomy and s/p of ileostomy), HLD, prediabetes, parkinsonism, strongyloidiasis, GERD, who presents with lower abdominal pain, loose stool, bilateral upper leg pain, double vision. CT of head negative for acute intracranial abnormalities. LE Doppler negative for DVT.   OT comments  Pt is seated in recliner on arrival with c/o purewick malfunction. Noted to be soiled, nurse tech in to provide +2 assist for transfers and ADLs. Min A +2 for STS and SPT from recliner>BSC>bed with cueing for sequencing, hand/feet placement and overall safety with tremors throughout and intermittent posterior lean. UB bathing performed with supervision and encouragement while seated on BSC, Max A for UB dressing to change out gowns. Max A required for LB ADLs including bathing and dressing during session with Min A +2 to maintain standing balance. Pt with fear of falling. Performed ostomy care with Min A while seated at EOB and Mod A for BLE management to return to supine. Pt returned to bed with all needs in place and will cont to require skilled acute OT services to maximize her safety and IND to return to PLOF.       If plan is discharge home, recommend the following:  Two people to help with walking and/or transfers;A lot of help with bathing/dressing/bathroom;Direct supervision/assist for medications management;Direct supervision/assist for financial management;Assist for transportation;Help with stairs or ramp for entrance;Supervision due to cognitive status;Assistance with cooking/housework   Equipment Recommendations  BSC/3in1;Other (comment) (defer)    Recommendations for Other Services      Precautions / Restrictions Precautions Precautions: Fall Recall of  Precautions/Restrictions: Intact Restrictions Weight Bearing Restrictions Per Provider Order: No       Mobility Bed Mobility Overal bed mobility: Needs Assistance Bed Mobility: Sit to Supine       Sit to supine: Mod assist   General bed mobility comments: BLE management to return to supine    Transfers Overall transfer level: Needs assistance Equipment used: Rolling walker (2 wheels) Transfers: Sit to/from Stand Sit to Stand: Mod assist, Min assist Stand pivot transfers: Min assist, Mod assist, +2 physical assistance, +2 safety/equipment         General transfer comment: requires cues and encouragement for hand/feet placement and sequencing during transfers from recliner>BSC>bed using RW with +2 assist for safety/anxiety and posterior lean     Balance Overall balance assessment: Needs assistance Sitting-balance support: Feet supported, Bilateral upper extremity supported Sitting balance-Leahy Scale: Fair Sitting balance - Comments: supervision for seated balance on BSC and EOB   Standing balance support: During functional activity, Bilateral upper extremity supported, Reliant on assistive device for balance Standing balance-Leahy Scale: Poor Standing balance comment: +2 with intermittent posterior lean                           ADL either performed or assessed with clinical judgement   ADL Overall ADL's : Needs assistance/impaired         Upper Body Bathing: Supervision/ safety;Set up;Sitting Upper Body Bathing Details (indicate cue type and reason): seated on BSC with mod encouragement Lower Body Bathing: Maximal assistance;Sit to/from stand;Sitting/lateral leans Lower Body Bathing Details (indicate cue type and reason): seated on BSC/standing with +2 assist d/t pt's instability and anxiety/tremors Upper Body Dressing : Maximal assistance;Sitting Upper Body Dressing Details (  indicate cue type and reason): change out gowns Lower Body Dressing: Maximal  assistance;Sit to/from stand;Sitting/lateral leans Lower Body Dressing Details (indicate cue type and reason): doff/donn mesh underwear Toilet Transfer: Moderate assistance;Minimal assistance;Rolling walker (2 wheels);BSC/3in1;Stand-pivot;+2 for safety/equipment Toilet Transfer Details (indicate cue type and reason): +2 for safety d/t tremors and pt anxiety Toileting- Clothing Manipulation and Hygiene: Maximal assistance;Sitting/lateral lean;Sit to/from stand Toileting - Clothing Manipulation Details (indicate cue type and reason): after cont urine on BSC     Functional mobility during ADLs: Minimal assistance;Moderate assistance;Cueing for safety;Cueing for sequencing;Rolling walker (2 wheels);+2 for safety/equipment      Extremity/Trunk Assessment              Vision       Perception     Praxis     Communication Communication Communication: Impaired Factors Affecting Communication: Difficulty expressing self;Hearing impaired   Cognition Arousal: Alert Behavior During Therapy: Anxious                                 Following commands: Impaired Following commands impaired: Follows one step commands with increased time, Follows one step commands inconsistently      Cueing   Cueing Techniques: Verbal cues, Gestural cues, Tactile cues  Exercises      Shoulder Instructions       General Comments tremulous throughout session and anxiety noted throughout, emptied ostomy seated at EOB    Pertinent Vitals/ Pain       Pain Assessment Pain Assessment: Faces Faces Pain Scale: No hurt Pain Location: no c/o pain during OT session Pain Intervention(s): Monitored during session  Home Living                                          Prior Functioning/Environment              Frequency  Min 2X/week        Progress Toward Goals  OT Goals(current goals can now be found in the care plan section)  Progress towards OT goals:  Progressing toward goals  Acute Rehab OT Goals OT Goal Formulation: With patient/family Time For Goal Achievement: 12/09/24 Potential to Achieve Goals: Fair  Plan      Co-evaluation                 AM-PAC OT 6 Clicks Daily Activity     Outcome Measure   Help from another person eating meals?: A Little Help from another person taking care of personal grooming?: A Little Help from another person toileting, which includes using toliet, bedpan, or urinal?: A Lot Help from another person bathing (including washing, rinsing, drying)?: A Lot Help from another person to put on and taking off regular upper body clothing?: A Lot Help from another person to put on and taking off regular lower body clothing?: A Lot 6 Click Score: 14    End of Session Equipment Utilized During Treatment: Gait belt;Rolling walker (2 wheels)  OT Visit Diagnosis: Muscle weakness (generalized) (M62.81);Other abnormalities of gait and mobility (R26.89);Unsteadiness on feet (R26.81);Pain   Activity Tolerance Patient tolerated treatment well   Patient Left in bed;with call bell/phone within reach;with bed alarm set;with family/visitor present   Nurse Communication Mobility status        Time: 1434-1500 OT Time Calculation (min): 26 min  Charges: OT General  Charges $OT Visit: 1 Visit OT Treatments $Self Care/Home Management : 23-37 mins  Colleen Donahoe Chrismon, OTR/L  11/26/2024, 3:31 PM   Ama Mcmaster E Chrismon 11/26/2024, 3:27 PM

## 2024-11-26 NOTE — Hospital Course (Signed)
 [name] is a 73 y.o. female with medical history significant of ulcerative colitis (s/p of total colectomy and s/p of ileostomy), HLD, prediabetes, parkinsonism, strongyloidiasis, GERD, who presents with lower abdominal pain, loose stool, bilateral upper leg pain, double vision.  Patient also has ileostomy, has increased output recently. She was given IV fluids, no longer dehydrated.   However, she has significant weakness, seen by PT, recommending nursing home placement.  Currently pending placement.

## 2024-11-26 NOTE — Plan of Care (Signed)

## 2024-11-26 NOTE — Care Management Obs Status (Signed)
 MEDICARE OBSERVATION STATUS NOTIFICATION   Patient Details  Name: [name] MRN: [id number] Date of Birth: 08-20-1951   Medicare Observation Status Notification Given:  Yes    Creta Dorame W, CMA 11/26/2024, 1:57 PM

## 2024-11-26 NOTE — TOC Progression Note (Addendum)
 Transition of Care [facility]) - Progression Note    Patient Details  Name: [name] MRN: [id number] Date of Birth: March 27, 1951  [facility]) CM/SW Contact  Nathanael CHRISTELLA Ring, RN Phone Number: 11/26/2024, 11:22 AM  Clinical Narrative:    CM met with patient at the bedside, her son is also present, they are in agreement with STR.  Son reports that his brother is actually out looking into the facilities now.  He gets him on the phone and son [name] reports that their first choice is [facility] and after that would be [facility].  CM reached out to [facility] with [facility] and she said she would review referral.   Patient does qualify for the [facility] SNF waiver.     Expected Discharge Plan: Skilled Nursing Facility Barriers to Discharge: Barriers Resolved               Expected Discharge Plan and Services       Living arrangements for the past 2 months: Single Family Home Expected Discharge Date: 11/25/24                                     Social Drivers of Health (SDOH) Interventions SDOH Screenings   Food Insecurity: No Food Insecurity (11/25/2024)  Housing: Low Risk (11/25/2024)  Transportation Needs: No Transportation Needs (11/25/2024)  Utilities: Not At Risk (11/25/2024)  Alcohol Screen: Low Risk (10/29/2021)  Depression (PHQ2-9): Medium Risk (09/24/2024)  Financial Resource Strain: Low Risk (10/29/2021)  Physical Activity: Sufficiently Active (10/29/2021)  Social Connections: Moderately Isolated (11/25/2024)  Stress: No Stress Concern Present (10/29/2021)  Tobacco Use: Low Risk (11/24/2024)    Readmission Risk Interventions     No data to display

## 2024-11-26 NOTE — Plan of Care (Signed)
  Problem: Ischemic Stroke/TIA Tissue Perfusion: Goal: Complications of ischemic stroke/TIA will be minimized Outcome: Progressing   Problem: Education: Goal: Knowledge of disease or condition will improve Outcome: Progressing   Problem: Coping: Goal: Will verbalize positive feelings about self Outcome: Progressing Goal: Will identify appropriate support needs Outcome: Progressing   Problem: Health Behavior/Discharge Planning: Goal: Ability to manage health-related needs will improve Outcome: Progressing Goal: Goals will be collaboratively established with patient/family Outcome: Progressing   Problem: Self-Care: Goal: Ability to participate in self-care as condition permits will improve Outcome: Progressing Goal: Verbalization of feelings and concerns over difficulty with self-care will improve Outcome: Progressing Goal: Ability to communicate needs accurately will improve Outcome: Progressing   Problem: Nutrition: Goal: Risk of aspiration will decrease Outcome: Progressing Goal: Dietary intake will improve Outcome: Progressing

## 2024-11-27 ENCOUNTER — Telehealth: Payer: Self-pay

## 2024-11-27 DIAGNOSIS — M79651 Pain in right thigh: Secondary | ICD-10-CM | POA: Diagnosis not present

## 2024-11-27 DIAGNOSIS — R1030 Lower abdominal pain, unspecified: Secondary | ICD-10-CM | POA: Diagnosis not present

## 2024-11-27 DIAGNOSIS — M79652 Pain in left thigh: Secondary | ICD-10-CM | POA: Diagnosis not present

## 2024-11-27 DIAGNOSIS — H532 Diplopia: Secondary | ICD-10-CM | POA: Diagnosis not present

## 2024-11-27 MED ORDER — LIDOCAINE 5 % EX PTCH
1.0000 | MEDICATED_PATCH | CUTANEOUS | Status: DC
  Administered 2024-11-27 – 2024-11-28 (×2): 1 via TRANSDERMAL
  Filled 2024-11-27 (×3): qty 1

## 2024-11-27 MED ORDER — METHOCARBAMOL 1000 MG/10ML IJ SOLN
500.0000 mg | Freq: Once | INTRAMUSCULAR | Status: AC
  Administered 2024-11-27: 500 mg via INTRAVENOUS
  Filled 2024-11-27: qty 5

## 2024-11-27 NOTE — Telephone Encounter (Signed)
 Provider doesn't have the forms.  Upon looking through the pts chart we don't have [name] listed on the chart was calling to confirm this information.

## 2024-11-27 NOTE — Telephone Encounter (Signed)
 LVM for [name] to contact the office back to follow up.   Please transfer to the office when he calls

## 2024-11-27 NOTE — Progress Notes (Signed)
" ° °  Brief Progress Note   _____________________________________________________________________________________________________________  Patient Name: [name] Patient DOB: 06/02/1951 Date: @TODAY @   Pt medically ready to be d/c home tomorrow CW spoke with son about SNF today.   [name] will talk to pt and pt other son about going to SNF.  HH is private pay and son aware _____________________________________________________________________________________________________________  [facility] RN Expeditor [name] Please contact us  directly via secure chat (search for [facility]) or by calling us  at [phone] [facility]).  "

## 2024-11-27 NOTE — Telephone Encounter (Addendum)
 Asking provider if she has the forms before I call him back.

## 2024-11-27 NOTE — Plan of Care (Signed)

## 2024-11-27 NOTE — TOC Progression Note (Signed)
 Transition of Care [facility]) - Progression Note    Patient Details  Name: [name] MRN: [id number] Date of Birth: Jun 02, 1951  Transition of Care [facility]) CM/SW Contact  Nathanael CHRISTELLA Ring, RN Phone Number: 11/27/2024, 4:08 PM  Clinical Narrative:    Beatris with [name] again and he is leaning towards [facility].  He wanted me to resend the referral to [facility] he said he talked to them.  Called and spoke with Alfonso she said to send the referral again but currently they have no beds, she said to reach back out in the am after 9/30.   Did find out that [facility] does not accept the [facility] medicare Waiver so that will not be possible for her to go there.    Expected Discharge Plan: Skilled Nursing Facility Barriers to Discharge: Barriers Resolved               Expected Discharge Plan and Services       Living arrangements for the past 2 months: Single Family Home Expected Discharge Date: 11/25/24                                     Social Drivers of Health (SDOH) Interventions SDOH Screenings   Food Insecurity: No Food Insecurity (11/25/2024)  Housing: Low Risk (11/25/2024)  Transportation Needs: No Transportation Needs (11/25/2024)  Utilities: Not At Risk (11/25/2024)  Alcohol Screen: Low Risk (10/29/2021)  Depression (PHQ2-9): Medium Risk (09/24/2024)  Financial Resource Strain: Low Risk (10/29/2021)  Physical Activity: Sufficiently Active (10/29/2021)  Social Connections: Moderately Isolated (11/25/2024)  Stress: No Stress Concern Present (10/29/2021)  Tobacco Use: Low Risk (11/24/2024)    Readmission Risk Interventions     No data to display

## 2024-11-27 NOTE — TOC Progression Note (Signed)
 Transition of Care [facility]) - Progression Note    Patient Details  Name: [name] MRN: [id number] Date of Birth: 04-01-1951  Transition of Care [facility]) CM/SW Contact  Nathanael CHRISTELLA Ring, RN Phone Number: 11/27/2024, 9:06 AM  Clinical Narrative:     Called and spoke with patient's son [name], informed him that [facility] declined to offer a bed but she has multiple other bed offers including [facility].  He will talk to his mother and brother and make a decision, he also asked about home health and home care services.  I informed him that [facility] is all private pay, he verbalizes understanding.  Informed him that he needs to make a decision today she is medically ready for discharge.   Expected Discharge Plan: Skilled Nursing Facility Barriers to Discharge: Barriers Resolved               Expected Discharge Plan and Services       Living arrangements for the past 2 months: Single Family Home Expected Discharge Date: 11/25/24                                     Social Drivers of Health (SDOH) Interventions SDOH Screenings   Food Insecurity: No Food Insecurity (11/25/2024)  Housing: Low Risk (11/25/2024)  Transportation Needs: No Transportation Needs (11/25/2024)  Utilities: Not At Risk (11/25/2024)  Alcohol Screen: Low Risk (10/29/2021)  Depression (PHQ2-9): Medium Risk (09/24/2024)  Financial Resource Strain: Low Risk (10/29/2021)  Physical Activity: Sufficiently Active (10/29/2021)  Social Connections: Moderately Isolated (11/25/2024)  Stress: No Stress Concern Present (10/29/2021)  Tobacco Use: Low Risk (11/24/2024)    Readmission Risk Interventions     No data to display

## 2024-11-27 NOTE — Plan of Care (Signed)
  Problem: Ischemic Stroke/TIA Tissue Perfusion: Goal: Complications of ischemic stroke/TIA will be minimized Outcome: Progressing   Problem: Education: Goal: Knowledge of disease or condition will improve Outcome: Progressing   Problem: Coping: Goal: Will verbalize positive feelings about self Outcome: Progressing Goal: Will identify appropriate support needs Outcome: Progressing   Problem: Health Behavior/Discharge Planning: Goal: Ability to manage health-related needs will improve Outcome: Progressing Goal: Goals will be collaboratively established with patient/family Outcome: Progressing   Problem: Self-Care: Goal: Ability to participate in self-care as condition permits will improve Outcome: Progressing Goal: Verbalization of feelings and concerns over difficulty with self-care will improve Outcome: Progressing Goal: Ability to communicate needs accurately will improve Outcome: Progressing   Problem: Nutrition: Goal: Risk of aspiration will decrease Outcome: Progressing Goal: Dietary intake will improve Outcome: Progressing

## 2024-11-27 NOTE — Progress Notes (Signed)
 " Progress Note   Patient: [name] FMW:[id number] DOB: February 12, 1951 DOA: 11/24/2024     0 DOS: the patient was seen and examined on 11/27/2024   Brief hospital course: [name] is a 73 y.o. female with medical history significant of ulcerative colitis (s/p of total colectomy and s/p of ileostomy), HLD, prediabetes, parkinsonism, strongyloidiasis, GERD, who presents with lower abdominal pain, loose stool, bilateral upper leg pain, double vision.  Patient also has ileostomy, has increased output recently. She was given IV fluids, no longer dehydrated.   However, she has significant weakness, seen by PT, recommending nursing home placement.  Currently pending placement.   Principal Problem:   Double vision Active Problems:   Diarrhea   Abdominal pain   Upper leg pain   HLD (hyperlipidemia)   Kidney lesion, native, left   Parkinsonism (HCC)   Assessment and Plan: Double vision: Patient has intermittent double vision for 1 month.  Has poor balance.   CT head did not show any acute changes, MRI brain also did not show any acute changes. Patient condition appear to be better after fluids.   Diarrhea and abdominal pain Increased ileostomy output.: CT of abdomen/pelvis is negative for acute intraabdominal issues.  Liver function and lipase normal. Negative C. difficile and a negative GI panel. Increased ileostomy had to be intermittent, will add Creon .  Patient was previously tested for small intestine bacterial overgrowth, was negative. Ileostomy output is slowing down after starting Creon .   Upper leg pain: Patient has bilateral upper leg pain.  Etiology is not today.  No fall or injury.  No erythema on examination.  CK level normal 75.  Lower extremity venous Doppler negative for DVT.  Pedal pulses 2+, no leg coldness. Patient describes the pain at bilateral anterior thigh, this could be from radiculopathy from lumbar spine.  Patient had a scheduled nerve conduction  study with the PCP, will let the patient go back to him.  At the meantime, I will start low-dose Neurontin .  She is also on Percocet as needed. Pain seems to be better today.   HLD (hyperlipidemia): Patient states that she is not taking medications currently.     Kidney lesion, native, left: This is incidental findings by CT scan. -Follow-up with PCP for outpatient workup, recommended MRI scan in 6 months.   Parkinsonism [facility]) -Fall precaution    Patient is pending nursing home placement, discussed with TOC, likely discharge tomorrow.     Subjective:  Still complain of headache, thigh pain, diarrhea better.  Some blurry vision.  Physical Exam: Vitals:   11/26/24 1517 11/26/24 2026 11/27/24 0414 11/27/24 0756  BP: 126/63 128/64 134/68 116/66  Pulse: 88 87 69 69  Resp: 18 18 18 19   Temp: 98.3 F (36.8 C) 97.7 F (36.5 C) 98.3 F (36.8 C) 97.7 F (36.5 C)  TempSrc: Oral Oral Oral Oral  SpO2: 100% 100% 100% 100%  Weight:      Height:       General exam: Appears calm and comfortable  Respiratory system: Clear to auscultation. Respiratory effort normal. Cardiovascular system: S1 & S2 heard, RRR. No JVD, murmurs, rubs, gallops or clicks. No pedal edema. Gastrointestinal system: Abdomen is nondistended, soft and nontender. No organomegaly or masses felt. Normal bowel sounds heard. Central nervous system: Alert and oriented x2. No focal neurological deficits. Extremities: Symmetric 5 x 5 power. Skin: No rashes, lesions or ulcers Psychiatry: Judgement and insight appear normal. Mood & affect appropriate.    Data Reviewed:  There are no new results to review at this time.  Family Communication: Husband updated at bedside  Disposition: Status is: Observation      Time spent: 35 minutes  Author: Murvin Mana, MD 11/27/2024 3:13 PM  For on call review [email or web address].    "

## 2024-11-27 NOTE — Telephone Encounter (Signed)
 Copied from CRM #[id number]. Topic: Clinical - Medical Advice >> Nov 27, 2024 12:05 PM Rea ORN wrote: Reason for CRM: Pt son [name] stated he needs FMLA forms filled out for him to take care of pt. She is currently in the hospital and they do not have an ETA of discharge yet. They are awaiting to see if she will go into a facility or if she will return home with caregivers.  Please call back to advise what [name] needs to do to have forms filled out, [phone]

## 2024-11-28 DIAGNOSIS — H532 Diplopia: Secondary | ICD-10-CM | POA: Diagnosis not present

## 2024-11-28 LAB — VITAMIN B12: Vitamin B-12: >4000 pg/mL — ABNORMAL HIGH (ref 180–914)

## 2024-11-28 NOTE — Telephone Encounter (Signed)
 [name] returned call and he did state that [name] is her son.  Informed him that we would need to get documentation giving us  permission to discuss anything with him as he currently was not listed as someone.  Also stated that pt son [name] would need to get the fmla forms from his employer to have us  complete as we do not have those here. [name] said he would get with him and let him know.

## 2024-11-28 NOTE — Discharge Summary (Signed)
 [facility] Discharge Summary   Patient: [name] FMW:[id number]  PCP: Gayle Saddie [name]  Date of admission: 11/24/2024   Date of discharge:  11/28/2024     Discharge Diagnoses:  Principal Problem:   Double vision Active Problems:   Diarrhea   Abdominal pain   Upper leg pain   HLD (hyperlipidemia)   Kidney lesion, native, left   Parkinsonism (HCC)   Admitted From: Home Disposition:  SNF   Recommendations for Outpatient Follow-up:  F/u with PCP, need to be seen by an MD in 1-2 days F/u with Neurology in 1 week for parkinson disease management  Follow up LABS/TEST:  F/u B12 level still pending   Contact information for follow-up providers     Gayle Saddie JULIANNA, PA-C Follow up in 1 week(s).   Specialty: Physician Assistant Contact information: [address] [place] [address] [phone]              Contact information for after-discharge care     Destination     [facility], [place] .   Service: Skilled Nursing Contact information: [address] [place]  [phone]                    Diet recommendation: Regular diet  Activity: The patient is advised to gradually reintroduce usual activities, as tolerated  Discharge Condition: stable  Code Status: Full code   History of present illness: As per the H and P dictated on admission.  Hospital Course:  [name] is a 73 y.o. female with medical history significant of ulcerative colitis (s/p of total colectomy and s/p of ileostomy), HLD, prediabetes, parkinsonism, strongyloidiasis, GERD, who presents with lower abdominal pain, loose stool, bilateral upper leg pain, double vision.  Patient also has ileostomy, has increased output recently. She was given IV fluids, no longer dehydrated.   However, she has significant weakness, seen by PT, recommending nursing home placement.  Currently pending placement.     Assessment and Plan:  # Double vision: Patient has intermittent double vision for 1 month.  Has poor balance.   CT head did not show any acute changes, MRI brain also did not show any acute changes. Patient condition appear to be better after fluids. Double vision is off/on as per patient, no vision problem at this time.    # Diarrhea and abdominal pain # Increased ileostomy output.: CT of abdomen/pelvis is negative for acute intraabdominal issues.  Liver function and lipase normal. Negative C. difficile and a negative GI panel. Increased ileostomy had to be intermittent, will add Creon .  Patient was previously tested for small intestine bacterial overgrowth, was negative. Ileostomy output is slowing down after starting Creon .   # Upper leg pain: Patient has bilateral upper leg pain.  Etiology is not today.  No fall or injury.  No erythema on examination.  CK level normal 75.  Lower extremity venous Doppler negative for DVT.  Pedal pulses 2+, no leg coldness. Patient describes the pain at bilateral anterior thigh, this could be from radiculopathy from lumbar spine.  Patient had a scheduled nerve conduction study with the PCP, will let the patient go back to him.  At the meantime, I will start low-dose Neurontin .  She is also on Percocet as needed. Pain seems to be better today.   # HLD (hyperlipidemia): Patient states that she is not taking medications currently.     # Kidney lesion, native, left: This  is incidental findings by CT scan. -Follow-up with PCP for outpatient workup, recommended MRI scan in 6 months.   # Parkinsonism [facility]) -Fall precaution F/u with Neurology in 1 wk F/u Vit B12 level and start supplement if low. Keep level >400    Body mass index is 19.75 kg/m.  Nutrition Interventions:  - Patient was instructed, not to drive, operate heavy machinery, perform activities at heights, swimming or participation in water activities or provide baby sitting services while on  Pain, Sleep and Anxiety Medications; until her outpatient Physician has advised to do so again.  - Also recommended to not to take more than prescribed Pain, Sleep and Anxiety Medications.  Patient was seen by physical therapy, who recommended Therapy, SNF placement, which was arranged. On the day of the discharge the patient's vitals were stable, and no other acute medical condition were reported by patient. the patient was felt safe to be discharge at SNF with Therapy.  Consultants: None Procedures: None  Discharge Exam: General: Appear in no distress, Oral Mucosa Clear, moist. Cardiovascular: S1 and S2 Present, no Murmur, Respiratory: normal respiratory effort, Bilateral Air entry present and no Crackles, no wheezes Abdomen: Bowel Sound present, Soft and no tenderness, no hernia Extremities: no Pedal edema, no calf tenderness Neurology: No focal deficit, Tremors noticed b/l hands affect appropriate.  Filed Weights   11/25/24 0037  Weight: 44.4 kg   Vitals:   11/28/24 0623 11/28/24 0810  BP: (!) 152/83 126/70  Pulse: 87 81  Resp: (!) 24 18  Temp: 98.8 F (37.1 C) (!) 97.5 F (36.4 C)  SpO2: 100% 100%    DISCHARGE MEDICATION: Allergies as of 11/28/2024   No Known Allergies      Medication List     STOP taking these medications    betamethasone  valerate lotion 0.1 % Commonly known as: VALISONE    cholestyramine  4 g packet Commonly known as: Questran    methocarbamol  500 MG tablet Commonly known as: ROBAXIN    pantoprazole  40 MG tablet Commonly known as: PROTONIX        TAKE these medications    diclofenac  Sodium 1 % Gel Commonly known as: VOLTAREN  Apply 2 g topically 4 (four) times daily as needed.   gabapentin  100 MG capsule Commonly known as: Neurontin  Take 1 capsule (100 mg total) by mouth 2 (two) times daily.   lidocaine  5 % ointment Commonly known as: XYLOCAINE  Apply 1 Application topically as needed. Apply to neck   lipase/protease/amylase  36000 UNITS Cpep capsule Commonly known as: Creon  Take 2 capsules (72,000 Units total) by mouth 3 (three) times daily with meals. May also take 1 capsule (36,000 Units total) as needed (with snacks - up to 4 snacks daily).   ondansetron  4 MG disintegrating tablet Commonly known as: ZOFRAN -ODT Take 1 tablet (4 mg total) by mouth every 8 (eight) hours as needed for nausea or vomiting.   promethazine  25 MG tablet Commonly known as: PHENERGAN  Take 1 tablet (25 mg total) by mouth every 8 (eight) hours as needed for nausea or vomiting (At bedtime as needed).       Allergies[1] Discharge Instructions     Call MD for:  difficulty breathing, headache or visual disturbances   Complete by: As directed    Call MD for:  extreme fatigue   Complete by: As directed    Call MD for:  persistant dizziness or light-headedness   Complete by: As directed    Call MD for:  persistant nausea and vomiting   Complete by:  As directed    Call MD for:  severe uncontrolled pain   Complete by: As directed    Call MD for:  temperature >100.4   Complete by: As directed    Diet - low sodium heart healthy   Complete by: As directed    Discharge instructions   Complete by: As directed    F/u with PCP, need to be seen by an MD in 1-2 days F/u with Neurology in 1 week for parkinson disease management  F/u B12 level still pending   Increase activity slowly   Complete by: As directed    Increase activity slowly   Complete by: As directed        The results of significant diagnostics from this hospitalization (including imaging, microbiology, ancillary and laboratory) are listed below for reference.    Significant Diagnostic Studies: MR BRAIN WO CONTRAST Result Date: 11/25/2024 EXAM: MRI Brain Without Contrast 11/25/2024 01:39:09 AM TECHNIQUE: Multiplanar multisequence MRI of the head/brain was performed without the administration of intravenous contrast. COMPARISON: MRI head 07/04/2024 CLINICAL HISTORY:  Diplopia FINDINGS: BRAIN AND VENTRICLES: No acute infarct. No intracranial hemorrhage. No mass. No midline shift. No hydrocephalus. The sella is unremarkable. Normal flow voids. ORBITS: No acute abnormality. SINUSES AND MASTOIDS: No acute abnormality. BONES AND SOFT TISSUES: Normal marrow signal. No acute soft tissue abnormality. IMPRESSION: 1. No acute intracranial abnormality. Electronically signed by: Gilmore Molt 11/25/2024 02:03 AM EST RP Workstation: [id number]   CT ABDOMEN PELVIS W CONTRAST Result Date: 11/24/2024 EXAM: CT ABDOMEN AND PELVIS WITH CONTRAST 11/24/2024 05:50:40 PM TECHNIQUE: CT of the abdomen and pelvis was performed with the administration of 75 mL of iohexol  (OMNIPAQUE ) 300 MG/ML solution. Multiplanar reformatted images are provided for review. Automated exposure control, iterative reconstruction, and/or weight-based adjustment of the mA/kV was utilized to reduce the radiation dose to as low as reasonably achievable. COMPARISON: CT abdomen and pelvis 06/23/2024. CLINICAL HISTORY: Abdominal pain, acute, nonlocalized. FINDINGS: LOWER CHEST: No acute abnormality. LIVER: The liver is unremarkable. GALLBLADDER AND BILE DUCTS: A gallstone is present. No biliary ductal dilatation. SPLEEN: No acute abnormality. PANCREAS: No acute abnormality. ADRENAL GLANDS: No acute abnormality. KIDNEYS, URETERS AND BLADDER: Bilateral renal cysts are present measuring up to 2.1 cm. There is a mildly hyperdense rounded structure in the inferior pole of the left kidney measuring 1 cm which is unchanged. There is no hydronephrosis. No stones in the kidneys or ureters. No perinephric or periureteral stranding. Urinary bladder is unremarkable. Per consensus, no follow-up is needed for simple Bosniak type 1 and 2 renal cysts, unless the patient has a malignancy history or risk factors. GI AND BOWEL: Stomach demonstrates no acute abnormality. Lower quadrant ostomy is present with large peristomal hernia containing  nondilated bowel. This is unchanged from prior. There is no bowel obstruction. PERITONEUM AND RETROPERITONEUM: No ascites. No free air. VASCULATURE: Aorta is normal in caliber. There are atherosclerotic calcifications of the aorta. LYMPH NODES: No lymphadenopathy. REPRODUCTIVE ORGANS: No acute abnormality. BONES AND SOFT TISSUES: There are severe degenerative changes of both hips, left greater than right. No acute osseous abnormality. No focal soft tissue abnormality. IMPRESSION: 1. No acute findings in the abdomen or pelvis. 2. Large peristomal hernia containing nondilated bowel, unchanged from prior. 3. Stable 1 cm mildly hyperdense left renal lesion, incompletely characterized, recommend renal protocol MRI (preferred) or CT without and with contrast in 6-12 months. 4. Cholelithiasis. Electronically signed by: Greig Pique MD 11/24/2024 07:07 PM EST RP Workstation: [id number]   US  Venous Img Lower  Bilateral Result Date: 11/24/2024 EXAM: ULTRASOUND DUPLEX OF THE BILATERAL LOWER EXTREMITY VEINS TECHNIQUE: Duplex ultrasound using B-mode/gray scaled imaging and Doppler spectral analysis and color flow was obtained of the deep venous structures of the bilateral lower extremity. COMPARISON: None available. CLINICAL HISTORY: leg swelling FINDINGS: BILATERAL: LEFT: The common femoral vein, femoral vein, popliteal vein, and posterior tibial vein demonstrate normal compressibility with normal color flow and spectral analysis. There is a Baker cyst in the left popliteal fossa measuring 3.7 x 1.1 x 1.2 cm. RIGHT: The common femoral vein, femoral vein, popliteal vein, and posterior tibial vein demonstrate normal compressibility with normal color flow and spectral analysis. IMPRESSION: 1. No evidence of deep venous thrombosis in the left lower extremity. 2. Baker cyst in the left popliteal fossa measuring 3.7 x 1.1 x 1.2 cm. Electronically signed by: Greig Pique MD 11/24/2024 06:53 PM EST RP Workstation: [id number]   CT  Head Wo Contrast Result Date: 11/24/2024 EXAM: CT HEAD WITHOUT CONTRAST 11/24/2024 02:12:00 PM TECHNIQUE: CT of the head was performed without the administration of intravenous contrast. Automated exposure control, iterative reconstruction, and/or weight based adjustment of the mA/kV was utilized to reduce the radiation dose to as low as reasonably achievable. COMPARISON: 06/23/2024 CLINICAL HISTORY: vision changes FINDINGS: BRAIN AND VENTRICLES: No acute hemorrhage. No evidence of acute infarct. No hydrocephalus. No extra-axial collection. No mass effect or midline shift. Atherosclerosis of skullbase vasculature without hyperdense vessel or abnormal calcification. ORBITS: No acute abnormality. SINUSES: No acute abnormality. SOFT TISSUES AND SKULL: No acute soft tissue abnormality. No skull fracture. IMPRESSION: 1. No acute intracranial abnormality. Electronically signed by: Donnice Mania MD 11/24/2024 03:33 PM EST RP Workstation: [id number]   DG Chest 2 View Result Date: 11/24/2024 CLINICAL DATA:  cough EXAM: CHEST - 2 VIEW COMPARISON:  None available. FINDINGS: Cardiomediastinal silhouette and pulmonary vasculature are within normal limits. Lungs are clear. IMPRESSION: No acute cardiopulmonary process. Electronically Signed   By: Aliene Lloyd M.D.   On: 11/24/2024 15:27    Microbiology: Recent Results (from the past 240 hours)  C Difficile Quick Screen w PCR reflex     Status: None   Collection Time: 11/24/24 11:47 PM   Specimen: STOOL  Result Value Ref Range Status   C Diff antigen NEGATIVE NEGATIVE Final   C Diff toxin NEGATIVE NEGATIVE Final   C Diff interpretation No C. difficile detected.  Final    Comment: Performed at [facility], [address] [place], [place] [address]  Gastrointestinal Panel by PCR , Stool     Status: None   Collection Time: 11/24/24 11:47 PM   Specimen: STOOL  Result Value Ref Range Status   Campylobacter species NOT DETECTED NOT DETECTED Final    Plesimonas shigelloides NOT DETECTED NOT DETECTED Final   Salmonella species NOT DETECTED NOT DETECTED Final   Yersinia enterocolitica NOT DETECTED NOT DETECTED Final   Vibrio species NOT DETECTED NOT DETECTED Final   Vibrio cholerae NOT DETECTED NOT DETECTED Final   Enteroaggregative E coli (EAEC) NOT DETECTED NOT DETECTED Final   Enteropathogenic E coli (EPEC) NOT DETECTED NOT DETECTED Final   Enterotoxigenic E coli (ETEC) NOT DETECTED NOT DETECTED Final   Shiga like toxin producing E coli (STEC) NOT DETECTED NOT DETECTED Final   Shigella/Enteroinvasive E coli (EIEC) NOT DETECTED NOT DETECTED Final   Cryptosporidium NOT DETECTED NOT DETECTED Final   Cyclospora cayetanensis NOT DETECTED NOT DETECTED Final   Entamoeba histolytica NOT DETECTED NOT DETECTED Final   Giardia lamblia NOT DETECTED  NOT DETECTED Final   Adenovirus F40/41 NOT DETECTED NOT DETECTED Final   Astrovirus NOT DETECTED NOT DETECTED Final   Norovirus GI/GII NOT DETECTED NOT DETECTED Final   Rotavirus A NOT DETECTED NOT DETECTED Final   Sapovirus (I, II, IV, and V) NOT DETECTED NOT DETECTED Final    Comment: Performed at [facility], [address] [place], [place] [address]     Labs: CBC: Recent Labs  Lab 11/24/24 1343  WBC 8.1  HGB 11.8*  HCT 36.0  MCV 95.5  PLT 391   Basic Metabolic Panel: Recent Labs  Lab 11/24/24 1343  NA 140  K 4.1  CL 104  CO2 23  GLUCOSE 92  BUN 18  CREATININE 0.62  CALCIUM 9.4   Liver Function Tests: Recent Labs  Lab 11/24/24 1343  AST 20  ALT 15  ALKPHOS 51  BILITOT 0.5  PROT 7.0  ALBUMIN 4.1   Recent Labs  Lab 11/24/24 1343  LIPASE 35   No results for input(s): AMMONIA in the last 168 hours. Cardiac Enzymes: Recent Labs  Lab 11/24/24 1343  CKTOTAL 75   BNP (last 3 results) No results for input(s): BNP in the last 8760 hours. CBG: No results for input(s): GLUCAP in the last 168 hours.  Time spent: 35 minutes  Signed:  Elvan Sor  [facility] 11/28/2024 11:50 AM      [1] No Known Allergies

## 2024-11-28 NOTE — Plan of Care (Signed)
  Problem: Ischemic Stroke/TIA Tissue Perfusion: Goal: Complications of ischemic stroke/TIA will be minimized Outcome: Progressing   Problem: Education: Goal: Knowledge of disease or condition will improve Outcome: Progressing   Problem: Coping: Goal: Will verbalize positive feelings about self Outcome: Progressing Goal: Will identify appropriate support needs Outcome: Progressing   Problem: Health Behavior/Discharge Planning: Goal: Ability to manage health-related needs will improve Outcome: Progressing Goal: Goals will be collaboratively established with patient/family Outcome: Progressing   Problem: Self-Care: Goal: Ability to participate in self-care as condition permits will improve Outcome: Progressing Goal: Verbalization of feelings and concerns over difficulty with self-care will improve Outcome: Progressing Goal: Ability to communicate needs accurately will improve Outcome: Progressing   Problem: Nutrition: Goal: Risk of aspiration will decrease Outcome: Progressing Goal: Dietary intake will improve Outcome: Progressing

## 2024-11-28 NOTE — Plan of Care (Signed)

## 2024-11-28 NOTE — TOC Progression Note (Signed)
 Transition of Care [facility]) - Progression Note    Patient Details  Name: [name] MRN: [id number] Date of Birth: 12/23/1950  Transition of Care [facility]) CM/SW Contact  Nathanael CHRISTELLA Ring, RN Phone Number: 11/28/2024, 10:39 AM  Clinical Narrative:     Beatris with [name] this morning, he would like to check back at [facility] to see if they have a bed, [facility] has no beds available.  Unfortunately [facility] does not accept the [facility] waiver.  Reached out to [facility] they are still unable to offer a bed.  Families next choice is [facility].  Bed offer accepted at [facility] and waiting to hear back if they can accept.    Expected Discharge Plan: Skilled Nursing Facility Barriers to Discharge: Barriers Resolved               Expected Discharge Plan and Services       Living arrangements for the past 2 months: Single Family Home Expected Discharge Date: 11/25/24                                     Social Drivers of Health (SDOH) Interventions SDOH Screenings   Food Insecurity: No Food Insecurity (11/25/2024)  Housing: Low Risk (11/25/2024)  Transportation Needs: No Transportation Needs (11/25/2024)  Utilities: Not At Risk (11/25/2024)  Alcohol Screen: Low Risk (10/29/2021)  Depression (PHQ2-9): Medium Risk (09/24/2024)  Financial Resource Strain: Low Risk (10/29/2021)  Physical Activity: Sufficiently Active (10/29/2021)  Social Connections: Moderately Isolated (11/25/2024)  Stress: No Stress Concern Present (10/29/2021)  Tobacco Use: Low Risk (11/24/2024)    Readmission Risk Interventions     No data to display

## 2024-11-28 NOTE — TOC Transition Note (Signed)
 Transition of Care [facility]) - Discharge Note   Patient Details  Name: [name] MRN: [id number] Date of Birth: 06/02/1951  Transition of Care [facility]) CM/SW Contact:  Nathanael CHRISTELLA Ring, RN Phone Number: 11/28/2024, 11:59 AM   Clinical Narrative:    [facility] changed their mind and can offer a bed and take her today, family pleased with this.  [name] will be able to transport her this afternoon.  Patient going to room 211, bedside RN will call report to [phone].  Family will provide her ostomy supplies and take to facility.    Final next level of care: Skilled Nursing Facility Barriers to Discharge: Barriers Resolved   Patient Goals and CMS Choice            Discharge Placement   Existing PASRR number confirmed : 11/25/24          Patient chooses bed at: [facility], [place] Patient to be transferred to facility by: Family Name of family member notified: [name], Patient and family notified of of transfer: 11/28/24  Discharge Plan and Services Additional resources added to the After Visit Summary for                                       Social Drivers of Health (SDOH) Interventions SDOH Screenings   Food Insecurity: No Food Insecurity (11/25/2024)  Housing: Low Risk (11/25/2024)  Transportation Needs: No Transportation Needs (11/25/2024)  Utilities: Not At Risk (11/25/2024)  Alcohol Screen: Low Risk (10/29/2021)  Depression (PHQ2-9): Medium Risk (09/24/2024)  Financial Resource Strain: Low Risk (10/29/2021)  Physical Activity: Sufficiently Active (10/29/2021)  Social Connections: Moderately Isolated (11/25/2024)  Stress: No Stress Concern Present (10/29/2021)  Tobacco Use: Low Risk (11/24/2024)     Readmission Risk Interventions     No data to display

## 2024-11-28 NOTE — Progress Notes (Signed)
 Patient's pain is not being controlled well.  She is in excruciating pain 3 hours after receiving PRN percocets.

## 2024-11-29 LAB — TSH+T3+THYABS+TPO AB+TRAB+T...
Anti-Thyroglobulin Antibodies: <1 [IU]/mL
Anti-Thyroid Peroxidase Ab: <9 [IU]/mL
Free T-3: 3.4 pg/mL
Free T4 by Dialysis: 1.7 ng/dL
Reverse T3,  LCMS Endo Sci: 22.5 ng/dL
TSH Receptor Antibody (TBII): <0.3 U/L
TSH: 0.74 uU/mL
Triiodothyronine (T-3), Serum: 101 ng/dL

## 2024-12-01 DIAGNOSIS — G20A1 Parkinson's disease without dyskinesia, without mention of fluctuations: Secondary | ICD-10-CM

## 2024-12-04 ENCOUNTER — Other Ambulatory Visit: Payer: Self-pay | Admitting: *Deleted

## 2024-12-04 NOTE — Patient Outreach (Signed)
" °  12/04/2024  Nicki Furlan Gadd 13-Oct-1951 [id number]   Mrs. Yasui previously admitted to [facility] under [facility] SN[facility] waiver.   Secure message sent to [facility] for collaboration on transition plans and address identifiable barriers.  Will continue to follow.   Pablo Hurst, MSN, RN, BSN [place]  [facility], [facility] RN Care Manager Direct Dial: [phone]                "

## 2024-12-10 ENCOUNTER — Other Ambulatory Visit: Payer: Self-pay | Admitting: *Deleted

## 2024-12-10 NOTE — Patient Outreach (Signed)
" °  12/10/2024  Merrianne Mccumbers Cundy 1951-03-20 [id number]   Mrs. Caddell previously admitted to [facility] under [facility] SN[facility] waiver.  Update received from [facility] social worker. Mrs. Lerry likely transition home soon. She is progressing well with therapy. She will return home with husband. Has supportive daughter and son-in-law.  No identifiable complex care management needs.   Pablo Hurst, MSN, RN, BSN [place]  [facility], [facility] RN Care Manager Direct Dial: [phone]                "

## 2024-12-14 ENCOUNTER — Other Ambulatory Visit: Payer: Self-pay | Admitting: *Deleted

## 2024-12-14 NOTE — Patient Outreach (Signed)
" ° °  12/14/2024  [name] 01-18-1951 [id number]   [name] previously utilized [facility] SN[facility] waiver for admission to [facility].  Update received from [name] [facility] discharge planner. [name] discharged from SNF today 12/14/24 with [facility]. Returned home with supportive family support.   No identifiable complex care management needs.   Pablo Hurst, MSN, RN, BSN [place]  [facility], [facility] RN Care Manager Direct Dial: [phone]                 "

## 2024-12-19 ENCOUNTER — Telehealth: Payer: Self-pay | Admitting: *Deleted

## 2024-12-19 NOTE — Telephone Encounter (Signed)
 Verbal approval given on secure VM.

## 2024-12-19 NOTE — Telephone Encounter (Signed)
 Copied from CRM [id number]. Topic: Clinical - Home Health Verbal Orders >> Dec 18, 2024  2:40 PM Zebedee SAUNDERS wrote: Caller/Agency: [facility] health per Hadassah Rushing Number: [phone] secure line Service Requested: Physical Therapy Frequency: 2xweek for 3 weeks, 1x week for 5 weeks Any new concerns about the patient? No >> Dec 19, 2024 11:45 AM Larissa RAMAN wrote: Hadassah with [facility] calling to check status of home health orders. Requesting a callback at [phone] secure line

## 2024-12-21 ENCOUNTER — Ambulatory Visit: Admitting: Family Medicine

## 2024-12-21 ENCOUNTER — Encounter: Payer: Self-pay | Admitting: Family Medicine

## 2024-12-21 VITALS — BP 121/69 | HR 79 | Ht 59.0 in | Wt 97.0 lb

## 2024-12-21 DIAGNOSIS — R600 Localized edema: Secondary | ICD-10-CM | POA: Diagnosis not present

## 2024-12-21 DIAGNOSIS — Z932 Ileostomy status: Secondary | ICD-10-CM

## 2024-12-21 NOTE — Progress Notes (Unsigned)
" ° ° °  Subjective   Patient ID: [name], female    DOB: 03-31-1951  Age: 74 y.o. MRN: [id number]  Chief Complaint  Patient presents with   Hospitalization Follow-up    Discussed the use of AI scribe software for clinical note transcription with the patient, who gave verbal consent to proceed.  History of Present Illness   [name] is a 74 year old female who presents with ongoing symptoms following a recent hospitalization, including double vision, abdominal pain, diarrhea, and leg swelling. She is accompanied by her son-in-law, [name].  She has persistent double vision that was evaluated during her recent hospitalization, which she thinks is related to her cataract. She has not tried covering one eye to see if it resolves.  She has ongoing abdominal pain and diarrhea. She started Creon  in the hospital with initial improvement, but the diarrhea recurred yesterday and she is still taking Creon  as prescribed.  Since early December she has had bilateral leg swelling in the ankles and toes, with tightness that prevents wearing regular shoes. She uses sandals instead. Her toes feel very cold but she has no pain, numbness, burning, or tingling. She does not use compression socks.  Current medications include gabapentin , Creon , Percocet, and Zofran  as needed. Zofran  and Phenergan  are used rarely. She is also concerned about hair loss.          The 10-year ASCVD risk score (Arnett DK, et al., 2019) is: 11.5%  Health Maintenance Due  Topic Date Due   Medicare Annual Wellness (AWV)  10/29/2022   COVID-19 Vaccine (1 - 2025-26 season) Never done      Objective:     BP 121/69   Pulse 79   Ht 4' 11 (1.499 m)   Wt 97 lb (44 kg)   SpO2 99%   BMI 19.59 kg/m  {Vitals History (Optional):23777}  Physical Exam   Cv: rrr, no murmur CHEST: Lungs clear to auscultation bilaterally. No wheezes, rhonchi, or crackles. ABDOMEN: Abdomen normal, ostomy site without redness or  irritation.  Ext: b/l LE edema in the ankles.        No results found for any visits on 12/21/24.      Assessment & Plan:   Leg edema Assessment & Plan: Chronic venous insufficiency causing swelling and cold, red toes. DVT ruled out by normal BNP and ultrasound. - Initiated compression socks. - Consider vascular specialist referral if needed.   Ileostomy status (HCC) Assessment & Plan: Intermittent diarrhea despite Creon  but overall better. Ostomy output normal, Zofran  use decreased. - Continue Creon . - Follow-up with pcp in a month.         Return for already has appt.    Toribio MARLA Slain, MD  "

## 2024-12-21 NOTE — Assessment & Plan Note (Signed)
 Chronic venous insufficiency causing swelling and cold, red toes. DVT ruled out by normal BNP and ultrasound. - Initiated compression socks. - Consider vascular specialist referral if needed.

## 2024-12-21 NOTE — Patient Instructions (Signed)
" ° °  YOUR PLAN: CHRONIC VENOUS INSUFFICIENCY OF LOWER EXTREMITIES: You have swelling and cold, red toes due to chronic venous insufficiency. A blood clot was ruled out. -Start using compression socks. - they make donning and doffing devices that help with this.  Your occupational therapist may be able to talk to you more about these devices.  -We may refer you to a vascular specialist if needed.  POSTPROCEDURAL DIGESTIVE SYSTEM COMPLICATION (DIARRHEA, OSTOMY): You have intermittent diarrhea despite taking Creon , but your ostomy output is normal and you are using Zofran  less frequently. -Continue taking Creon  as prescribed. -Follow up with [name] in a month.    "

## 2024-12-21 NOTE — Assessment & Plan Note (Signed)
 Intermittent diarrhea despite Creon  but overall better. Ostomy output normal, Zofran  use decreased. - Continue Creon . - Follow-up with pcp in a month.

## 2024-12-26 ENCOUNTER — Telehealth: Payer: Self-pay | Admitting: *Deleted

## 2024-12-26 NOTE — Telephone Encounter (Signed)
 Verbal given.

## 2024-12-26 NOTE — Telephone Encounter (Signed)
 Copied from CRM [id number]. Topic: Clinical - Home Health Verbal Orders >> Dec 26, 2024  1:59 PM Travis FALCON wrote: Caller/Agency: [facility]  Callback Number: [phone], voicemail is secure  Service Requested: Occupational Therapy Frequency: 1 week 9  Any new concerns about the patient? No

## 2024-12-27 DIAGNOSIS — G8929 Other chronic pain: Secondary | ICD-10-CM

## 2024-12-27 DIAGNOSIS — G20A1 Parkinson's disease without dyskinesia, without mention of fluctuations: Secondary | ICD-10-CM

## 2024-12-28 MED ORDER — OXYCODONE-ACETAMINOPHEN 5-325 MG PO TABS: 1.0000 | ORAL_TABLET | Freq: Three times a day (TID) | ORAL | 0 refills | Status: AC | PRN

## 2025-01-24 ENCOUNTER — Ambulatory Visit
